# Patient Record
Sex: Male | Born: 1955
Health system: Southern US, Community
[De-identification: ages and names within clinical notes are randomized; demographics above are authoritative.]

## PROBLEM LIST (undated history)

## (undated) DIAGNOSIS — N200 Calculus of kidney: Secondary | ICD-10-CM

## (undated) DIAGNOSIS — Z87442 Personal history of urinary calculi: Secondary | ICD-10-CM

## (undated) DIAGNOSIS — K648 Other hemorrhoids: Secondary | ICD-10-CM

## (undated) DIAGNOSIS — K8020 Calculus of gallbladder without cholecystitis without obstruction: Secondary | ICD-10-CM

## (undated) DIAGNOSIS — Z8619 Personal history of other infectious and parasitic diseases: Secondary | ICD-10-CM

## (undated) DIAGNOSIS — T7840XA Allergy, unspecified, initial encounter: Secondary | ICD-10-CM

## (undated) DIAGNOSIS — E785 Hyperlipidemia, unspecified: Secondary | ICD-10-CM

## (undated) DIAGNOSIS — M1990 Unspecified osteoarthritis, unspecified site: Secondary | ICD-10-CM

## (undated) DIAGNOSIS — Z86010 Personal history of colonic polyps: Secondary | ICD-10-CM

## (undated) DIAGNOSIS — Z800 Family history of malignant neoplasm of digestive organs: Secondary | ICD-10-CM

## (undated) DIAGNOSIS — E0500 Thyrotoxicosis with diffuse goiter without thyrotoxic crisis or storm: Secondary | ICD-10-CM

## (undated) DIAGNOSIS — Z8719 Personal history of other diseases of the digestive system: Secondary | ICD-10-CM

## (undated) DIAGNOSIS — E119 Type 2 diabetes mellitus without complications: Secondary | ICD-10-CM

## (undated) DIAGNOSIS — K219 Gastro-esophageal reflux disease without esophagitis: Secondary | ICD-10-CM

## (undated) DIAGNOSIS — K760 Fatty (change of) liver, not elsewhere classified: Secondary | ICD-10-CM

## (undated) DIAGNOSIS — S82401A Unspecified fracture of shaft of right fibula, initial encounter for closed fracture: Secondary | ICD-10-CM

## (undated) DIAGNOSIS — I10 Essential (primary) hypertension: Secondary | ICD-10-CM

## (undated) HISTORY — DX: Family history of malignant neoplasm of digestive organs: Z80.0

## (undated) HISTORY — DX: Unspecified osteoarthritis, unspecified site: M19.90

## (undated) HISTORY — DX: Allergy, unspecified, initial encounter: T78.40XA

## (undated) HISTORY — DX: Personal history of colonic polyps: Z86.010

## (undated) HISTORY — DX: Essential (primary) hypertension: I10

## (undated) HISTORY — DX: Type 2 diabetes mellitus without complications: E11.9

## (undated) HISTORY — PX: URETERAL STENT PLACEMENT: SHX822

## (undated) HISTORY — DX: Hemochromatosis, unspecified: E83.119

## (undated) HISTORY — DX: Thyrotoxicosis with diffuse goiter without thyrotoxic crisis or storm: E05.00

## (undated) HISTORY — DX: Personal history of other infectious and parasitic diseases: Z86.19

## (undated) HISTORY — DX: Hyperlipidemia, unspecified: E78.5

## (undated) HISTORY — DX: Unspecified fracture of shaft of right fibula, initial encounter for closed fracture: S82.401A

## (undated) HISTORY — DX: Calculus of kidney: N20.0

## (undated) HISTORY — PX: LITHOTRIPSY: SUR834

## (undated) HISTORY — DX: Other hemorrhoids: K64.8

## (undated) HISTORY — DX: Gastro-esophageal reflux disease without esophagitis: K21.9

## (undated) HISTORY — PX: COLONOSCOPY W/ BIOPSIES AND POLYPECTOMY: SHX1376

---

## 2004-10-24 ENCOUNTER — Ambulatory Visit: Payer: Self-pay | Admitting: Family Medicine

## 2004-11-28 ENCOUNTER — Ambulatory Visit: Payer: Self-pay | Admitting: Family Medicine

## 2004-12-05 ENCOUNTER — Ambulatory Visit: Payer: Self-pay | Admitting: Family Medicine

## 2005-01-16 ENCOUNTER — Ambulatory Visit: Payer: Self-pay | Admitting: Internal Medicine

## 2005-11-29 ENCOUNTER — Ambulatory Visit: Payer: Self-pay | Admitting: Family Medicine

## 2005-12-06 ENCOUNTER — Ambulatory Visit: Payer: Self-pay | Admitting: Family Medicine

## 2005-12-31 ENCOUNTER — Ambulatory Visit: Payer: Self-pay | Admitting: Family Medicine

## 2006-12-17 ENCOUNTER — Ambulatory Visit: Payer: Self-pay | Admitting: Family Medicine

## 2006-12-17 LAB — CONVERTED CEMR LAB
Albumin: 4.1 g/dL (ref 3.5–5.2)
BUN: 17 mg/dL (ref 6–23)
Basophils Absolute: 0 10*3/uL (ref 0.0–0.1)
CO2: 31 meq/L (ref 19–32)
Chol/HDL Ratio, serum: 5.9
Creatinine, Ser: 1.3 mg/dL (ref 0.4–1.5)
Hemoglobin: 15.4 g/dL (ref 13.0–17.0)
MCHC: 34.8 g/dL (ref 30.0–36.0)
MCV: 98 fL (ref 78.0–100.0)
Monocytes Absolute: 0.6 10*3/uL (ref 0.2–0.7)
Monocytes Relative: 10.4 % (ref 3.0–11.0)
Neutro Abs: 3.4 10*3/uL (ref 1.4–7.7)
Neutrophils Relative %: 61.8 % (ref 43.0–77.0)
PSA: 0.9 ng/mL (ref 0.10–4.00)
RDW: 11.7 % (ref 11.5–14.6)
TSH: 2.06 microintl units/mL (ref 0.35–5.50)
Total Bilirubin: 1.1 mg/dL (ref 0.3–1.2)
Triglyceride fasting, serum: 185 mg/dL — ABNORMAL HIGH (ref 0–149)
VLDL: 37 mg/dL (ref 0–40)

## 2006-12-24 ENCOUNTER — Ambulatory Visit: Payer: Self-pay | Admitting: Family Medicine

## 2007-02-13 ENCOUNTER — Ambulatory Visit: Payer: Self-pay | Admitting: Family Medicine

## 2007-08-22 DIAGNOSIS — I10 Essential (primary) hypertension: Secondary | ICD-10-CM

## 2007-12-26 ENCOUNTER — Ambulatory Visit: Payer: Self-pay | Admitting: Family Medicine

## 2007-12-26 LAB — CONVERTED CEMR LAB
Nitrite: NEGATIVE
Protein, U semiquant: NEGATIVE
Urobilinogen, UA: 0.2
WBC Urine, dipstick: NEGATIVE
pH: 7

## 2007-12-31 ENCOUNTER — Ambulatory Visit: Payer: Self-pay | Admitting: Family Medicine

## 2007-12-31 ENCOUNTER — Telehealth: Payer: Self-pay | Admitting: Family Medicine

## 2007-12-31 DIAGNOSIS — J309 Allergic rhinitis, unspecified: Secondary | ICD-10-CM | POA: Insufficient documentation

## 2007-12-31 DIAGNOSIS — K219 Gastro-esophageal reflux disease without esophagitis: Secondary | ICD-10-CM | POA: Insufficient documentation

## 2007-12-31 LAB — CONVERTED CEMR LAB
Albumin: 4.6 g/dL (ref 3.5–5.2)
Basophils Absolute: 0 10*3/uL (ref 0.0–0.1)
Cholesterol: 163 mg/dL (ref 0–200)
Creatinine, Ser: 1.2 mg/dL (ref 0.4–1.5)
Eosinophils Absolute: 0.1 10*3/uL (ref 0.0–0.6)
HCT: 43 % (ref 39.0–52.0)
Hemoglobin: 15.4 g/dL (ref 13.0–17.0)
Lymphocytes Relative: 20.1 % (ref 12.0–46.0)
MCHC: 35.7 g/dL (ref 30.0–36.0)
MCV: 97.4 fL (ref 78.0–100.0)
Monocytes Absolute: 0.6 10*3/uL (ref 0.2–0.7)
Neutro Abs: 3.5 10*3/uL (ref 1.4–7.7)
Neutrophils Relative %: 66.2 % (ref 43.0–77.0)
PSA: 0.73 ng/mL (ref 0.10–4.00)
Potassium: 4.6 meq/L (ref 3.5–5.1)
RDW: 11.7 % (ref 11.5–14.6)
Sodium: 140 meq/L (ref 135–145)
TSH: 2.07 microintl units/mL (ref 0.35–5.50)
Total Bilirubin: 0.9 mg/dL (ref 0.3–1.2)
Total Protein: 6.9 g/dL (ref 6.0–8.3)

## 2008-03-17 ENCOUNTER — Ambulatory Visit: Payer: Self-pay | Admitting: Gastroenterology

## 2008-03-31 ENCOUNTER — Encounter: Payer: Self-pay | Admitting: Gastroenterology

## 2008-03-31 ENCOUNTER — Ambulatory Visit: Payer: Self-pay | Admitting: Gastroenterology

## 2008-03-31 ENCOUNTER — Encounter: Payer: Self-pay | Admitting: Family Medicine

## 2008-03-31 DIAGNOSIS — Z860100 Personal history of colon polyps, unspecified: Secondary | ICD-10-CM

## 2008-03-31 HISTORY — DX: Personal history of colon polyps, unspecified: Z86.0100

## 2008-03-31 HISTORY — DX: Personal history of colonic polyps: Z86.010

## 2008-05-17 ENCOUNTER — Ambulatory Visit: Payer: Self-pay | Admitting: Family Medicine

## 2008-05-17 DIAGNOSIS — K921 Melena: Secondary | ICD-10-CM | POA: Insufficient documentation

## 2008-05-17 LAB — CONVERTED CEMR LAB: OCCULT 3: NEGATIVE

## 2008-05-18 ENCOUNTER — Encounter: Payer: Self-pay | Admitting: Family Medicine

## 2008-09-21 ENCOUNTER — Encounter: Payer: Self-pay | Admitting: Family Medicine

## 2008-09-29 ENCOUNTER — Telehealth: Payer: Self-pay | Admitting: Family Medicine

## 2009-01-04 ENCOUNTER — Ambulatory Visit: Payer: Self-pay | Admitting: Family Medicine

## 2009-01-04 LAB — CONVERTED CEMR LAB
Blood in Urine, dipstick: NEGATIVE
Nitrite: NEGATIVE
Protein, U semiquant: NEGATIVE
WBC Urine, dipstick: NEGATIVE

## 2009-01-11 ENCOUNTER — Ambulatory Visit: Payer: Self-pay | Admitting: Family Medicine

## 2009-01-11 LAB — CONVERTED CEMR LAB
AST: 54 units/L — ABNORMAL HIGH (ref 0–37)
Albumin: 4.4 g/dL (ref 3.5–5.2)
Alkaline Phosphatase: 72 units/L (ref 39–117)
BUN: 20 mg/dL (ref 6–23)
Bilirubin, Direct: 0.1 mg/dL (ref 0.0–0.3)
Chloride: 103 meq/L (ref 96–112)
Eosinophils Relative: 3.5 % (ref 0.0–5.0)
GFR calc non Af Amer: 67 mL/min
Glucose, Bld: 94 mg/dL (ref 70–99)
HDL: 20.4 mg/dL — ABNORMAL LOW (ref >39.0)
LDL Cholesterol: 94 mg/dL (ref 0–99)
Monocytes Relative: 10.8 % (ref 3.0–12.0)
Neutrophils Relative %: 63.9 % (ref 43.0–77.0)
Platelets: 212 10*3/uL (ref 150–400)
Potassium: 4.6 meq/L (ref 3.5–5.1)
Total CHOL/HDL Ratio: 7.5
Total Protein: 7.1 g/dL (ref 6.0–8.3)
VLDL: 37 mg/dL (ref 0–40)
WBC: 5.6 10*3/uL (ref 4.5–10.5)

## 2009-08-01 ENCOUNTER — Telehealth: Payer: Self-pay | Admitting: Family Medicine

## 2009-12-28 ENCOUNTER — Telehealth: Payer: Self-pay | Admitting: Family Medicine

## 2010-01-04 ENCOUNTER — Ambulatory Visit: Payer: Self-pay | Admitting: Family Medicine

## 2010-01-18 ENCOUNTER — Ambulatory Visit: Payer: Self-pay | Admitting: Family Medicine

## 2010-01-18 DIAGNOSIS — M7710 Lateral epicondylitis, unspecified elbow: Secondary | ICD-10-CM | POA: Insufficient documentation

## 2010-01-18 LAB — CONVERTED CEMR LAB
Bilirubin Urine: NEGATIVE
Blood in Urine, dipstick: NEGATIVE
Protein, U semiquant: NEGATIVE
Urobilinogen, UA: 0.2

## 2010-01-19 LAB — CONVERTED CEMR LAB
ALT: 63 units/L — ABNORMAL HIGH (ref 0–53)
AST: 64 units/L — ABNORMAL HIGH (ref 0–37)
BUN: 14 mg/dL (ref 6–23)
Basophils Absolute: 0 10*3/uL (ref 0.0–0.1)
Bilirubin, Direct: 0.1 mg/dL (ref 0.0–0.3)
Calcium: 9.5 mg/dL (ref 8.4–10.5)
Cholesterol: 160 mg/dL (ref 0–200)
Creatinine, Ser: 1.2 mg/dL (ref 0.4–1.5)
Eosinophils Relative: 2.8 % (ref 0.0–5.0)
GFR calc non Af Amer: 66.73 mL/min (ref >60)
Glucose, Bld: 76 mg/dL (ref 70–99)
HDL: 30.6 mg/dL — ABNORMAL LOW (ref >39.00)
LDL Cholesterol: 92 mg/dL (ref 0–99)
Lymphocytes Relative: 18.8 % (ref 12.0–46.0)
Monocytes Relative: 8.8 % (ref 3.0–12.0)
Neutrophils Relative %: 69 % (ref 43.0–77.0)
Platelets: 191 10*3/uL (ref 150.0–400.0)
Potassium: 4 meq/L (ref 3.5–5.1)
RDW: 11.2 % — ABNORMAL LOW (ref 11.5–14.6)
Total Bilirubin: 1.1 mg/dL (ref 0.3–1.2)
VLDL: 37.8 mg/dL (ref 0.0–40.0)
WBC: 5.9 10*3/uL (ref 4.5–10.5)

## 2010-04-10 ENCOUNTER — Ambulatory Visit: Payer: Self-pay | Admitting: Family Medicine

## 2010-04-19 ENCOUNTER — Ambulatory Visit: Payer: Self-pay | Admitting: Family Medicine

## 2010-04-19 DIAGNOSIS — Z862 Personal history of diseases of the blood and blood-forming organs and certain disorders involving the immune mechanism: Secondary | ICD-10-CM

## 2010-04-19 DIAGNOSIS — Z8639 Personal history of other endocrine, nutritional and metabolic disease: Secondary | ICD-10-CM

## 2010-04-19 LAB — CONVERTED CEMR LAB
AST: 50 units/L — ABNORMAL HIGH (ref 0–37)
Albumin: 4.2 g/dL (ref 3.5–5.2)
Alkaline Phosphatase: 73 units/L (ref 39–117)
Bilirubin, Direct: 0.2 mg/dL (ref 0.0–0.3)
Total Protein: 6.6 g/dL (ref 6.0–8.3)

## 2010-08-21 ENCOUNTER — Ambulatory Visit: Payer: Self-pay | Admitting: Family Medicine

## 2010-08-21 LAB — CONVERTED CEMR LAB
AST: 61 units/L — ABNORMAL HIGH (ref 0–37)
Albumin: 3.9 g/dL (ref 3.5–5.2)
Alkaline Phosphatase: 81 units/L (ref 39–117)
Total Protein: 6.2 g/dL (ref 6.0–8.3)

## 2010-09-04 ENCOUNTER — Encounter: Admission: RE | Admit: 2010-09-04 | Discharge: 2010-09-04 | Payer: Self-pay | Admitting: Family Medicine

## 2010-12-01 ENCOUNTER — Emergency Department (HOSPITAL_COMMUNITY)
Admission: EM | Admit: 2010-12-01 | Discharge: 2010-12-02 | Payer: Self-pay | Source: Home / Self Care | Admitting: Emergency Medicine

## 2010-12-05 ENCOUNTER — Telehealth: Payer: Self-pay | Admitting: Family Medicine

## 2010-12-29 ENCOUNTER — Telehealth: Payer: Self-pay | Admitting: Family Medicine

## 2011-01-06 ENCOUNTER — Encounter: Payer: Self-pay | Admitting: Family Medicine

## 2011-01-11 NOTE — Assessment & Plan Note (Signed)
Summary: CPX//CCM//PT RESCD//CCM   Vital Signs:  Patient profile:   55 year old male Height:      67 inches Weight:      181 pounds BMI:     28.45 O2 Sat:      97 % Temp:     97.8 degrees F Pulse rate:   71 / minute BP sitting:   124 / 82  (left arm) Cuff size:   regular  Vitals Entered By: Pura Spice, RN (January 18, 2010 8:31 AM) CC: cpx sinus drainage.  Is Patient Diabetic? No Pain Assessment Patient in pain? no       Does patient need assistance? Functional Status Self care   History of Present Illness: This 55 year old white married male is in today for complete physical examination He complains of some postnasal drainage and nasal drainage and his other complaint is that of pain and tenderness in his left elbow at times Had colonoscopic exam in 2009 by Dr. Sheryn Bison and to be repeated in 2014 immunizations up-to-date Needs chest x-ray, blood pressure and control EKG done today reveals no ischemia no abnormalities  Allergies (verified): No Known Drug Allergies  Past History:  Past Medical History: Last updated: 08/22/2007 Allergies Kidney Stones Hypertension  Past Surgical History: Last updated: 08/22/2007 Colonoscopy  Social History: Last updated: 08/22/2007 Occupation: Married Never Smoked Alcohol use-no Drug use-no Regular exercise-no  Risk Factors: Smoking Status: never (08/22/2007)  Past History:  Care Management: Gastroenterology: Dr Sheryn Bison   Review of Systems      See HPI General:  See HPI; Denies chills, fatigue, fever, loss of appetite, malaise, sleep disorder, sweats, weakness, and weight loss. Eyes:  Denies blurring, discharge, double vision, eye irritation, eye pain, halos, itching, light sensitivity, red eye, vision loss-1 eye, and vision loss-both eyes. ENT:  Denies decreased hearing, difficulty swallowing, ear discharge, earache, hoarseness, nasal congestion, nosebleeds, postnasal drainage, ringing in ears,  sinus pressure, and sore throat. CV:  Denies bluish discoloration of lips or nails, chest pain or discomfort, difficulty breathing at night, difficulty breathing while lying down, fainting, fatigue, leg cramps with exertion, lightheadness, near fainting, palpitations, shortness of breath with exertion, swelling of feet, swelling of hands, and weight gain. Resp:  Denies chest discomfort, chest pain with inspiration, cough, coughing up blood, excessive snoring, hypersomnolence, morning headaches, pleuritic, shortness of breath, sputum productive, and wheezing. GI:  Denies abdominal pain, bloody stools, change in bowel habits, constipation, dark tarry stools, diarrhea, excessive appetite, gas, hemorrhoids, indigestion, loss of appetite, nausea, vomiting, vomiting blood, and yellowish skin color. GU:  Denies decreased libido, discharge, dysuria, erectile dysfunction, genital sores, hematuria, incontinence, nocturia, urinary frequency, and urinary hesitancy. MS:  Complains of joint pain; left elbow pain recurrent. Derm:  Denies changes in color of skin, changes in nail beds, dryness, excessive perspiration, flushing, hair loss, insect bite(s), itching, lesion(s), poor wound healing, and rash. Neuro:  Denies brief paralysis, difficulty with concentration, disturbances in coordination, falling down, headaches, inability to speak, memory loss, numbness, poor balance, seizures, sensation of room spinning, tingling, tremors, visual disturbances, and weakness. Psych:  Denies alternate hallucination ( auditory/visual), anxiety, depression, easily angered, easily tearful, irritability, mental problems, panic attacks, sense of great danger, suicidal thoughts/plans, thoughts of violence, unusual visions or sounds, and thoughts /plans of harming others. Endo:  Denies cold intolerance, excessive hunger, excessive thirst, excessive urination, heat intolerance, polyuria, and weight change. Heme:  Denies abnormal bruising,  bleeding, enlarge lymph nodes, fevers, pallor, and skin discoloration.  Physical Exam  General:  Well-developed,well-nourished,in no acute distress; alert,appropriate and cooperative throughout examination Head:  Normocephalic and atraumatic without obvious abnormalities. No apparent alopecia or balding. Eyes:  No corneal or conjunctival inflammation noted. EOMI. Perrla. Funduscopic exam benign, without hemorrhages, exudates or papilledema. Vision grossly normal. Ears:  External ear exam shows no significant lesions or deformities.  Otoscopic examination reveals clear canals, tympanic membranes are intact bilaterally without bulging, retraction, inflammation or discharge. Hearing is grossly normal bilaterally. Nose:  boggy pale nasal mucosa with clear drainage slight Mouth:  Oral mucosa and oropharynx without lesions or exudates.  Teeth in good repair. Neck:  No deformities, masses, or tenderness noted. Chest Wall:  No deformities, masses, tenderness or gynecomastia noted. Breasts:  No masses or gynecomastia noted Lungs:  Normal respiratory effort, chest expands symmetrically. Lungs are clear to auscultation, no crackles or wheezes. Heart:  Normal rate and regular rhythm. S1 and S2 normal without gallop, murmur, click, rub or other extra sounds. Abdomen:  Bowel sounds positive,abdomen soft and non-tender without masses, organomegaly or hernias noted. Rectal:  No external abnormalities noted. Normal sphincter tone. No rectal masses or tenderness. Genitalia:  Testes bilaterally descended without nodularity, tenderness or masses. No scrotal masses or lesions. No penis lesions or urethral discharge. Prostate:  Prostate gland firm and smooth, no enlargement, nodularity, tenderness, mass, asymmetry or induration. Msk:  tender l left elbow, lateral epicondyles Pulses:  R and L carotid,radial,femoral,dorsalis pedis and posterior tibial pulses are full and equal bilaterally Extremities:  No clubbing,  cyanosis, edema, or deformity noted with normal full range of motion of all joints.   Neurologic:  No cranial nerve deficits noted. Station and gait are normal. Plantar reflexes are down-going bilaterally. DTRs are symmetrical throughout. Sensory, motor and coordinative functions appear intact. Skin:  Intact without suspicious lesions or rashes Cervical Nodes:  No lymphadenopathy noted Axillary Nodes:  No palpable lymphadenopathy Inguinal Nodes:  No significant adenopathy Psych:  Cognition and judgment appear intact. Alert and cooperative with normal attention span and concentration. No apparent delusions, illusions, hallucinations   Impression & Recommendations:  Problem # 1:  HEALTH MAINTENANCE EXAM (ICD-V70.0) Assessment Unchanged  Orders: T-2 View CXR (71020TC)  Problem # 2:  ALLERGIC RHINITIS (ICD-477.9) Assessment: Unchanged  Problem # 3:  LATERAL EPICONDYLITIS, LEFT (ICD-726.32) Assessment: Unchanged baclofen accident at 5 mg b.i.d. plus use tennis elbow splint when possible  Problem # 4:  GERD (ICD-530.81) Assessment: Improved  His updated medication list for this problem includes:    Omeprazole 40 Mg Cpdr (Omeprazole) .Marland Kitchen... 1 two times a day for gerd  Problem # 5:  HYPERTENSION (ICD-401.9) Assessment: Improved  His updated medication list for this problem includes:    Lisinopril 20 Mg Tabs (Lisinopril) .Marland Kitchen... Take 1 by mouth  every day for blood pressure  Orders: EKG w/ Interpretation (93000)  Complete Medication List: 1)  Diclofenac Sodium 75 Mg Tbec (Diclofenac sodium) .... Two times a day as needed after meals  for epicondylitis 2)  Adult Aspirin Low Strength 81 Mg Tbdp (Aspirin) 3)  Lisinopril 20 Mg Tabs (Lisinopril) .... Take 1 by mouth  every day for blood pressure 4)  Omeprazole 40 Mg Cpdr (Omeprazole) .Marland Kitchen.. 1 two times a day for gerd 5)  Fenofibrate 160 Mg Tabs (Fenofibrate) .Marland Kitchen.. 1 qd  Patient Instructions: 1)  Physical examination and lab studies good  except for slight elevation of liver enzymes 2)  To re check in 3 months, Hepatic studies 3)  elbow pain is lateral epicondylitis and  would like for you to continue diclofenac and also use tennis elbow splint when be inactive at the left elbow, avoid lifting with the left arm when possible 4)  Continue walking for exercise number increase frequency if possible Prescriptions: FENOFIBRATE 160 MG TABS (FENOFIBRATE) 1 qd  #30 x 11   Entered and Authorized by:   Judithann Sheen MD   Signed by:   Judithann Sheen MD on 01/18/2010   Method used:   [facility] to        [facility]  [address] [id number]       [address]       [place], [place]  [address]       Ph: [phone] or [phone]       Fax: [phone]   RxID:   [id number] OMEPRAZOLE 40 MG CPDR (OMEPRAZOLE) 1 two times a day for gerd  #60 x 11   Entered and Authorized by:   Judithann Sheen MD   Signed by:   Judithann Sheen MD on 01/18/2010   Method used:   [facility]        [facility]  [address] [id number]       [address]       [place], [place]  [address]       Ph: [phone] or [phone]       Fax: [phone]   RxID:   [id number] LISINOPRIL 20 MG  TABS (LISINOPRIL) take 1 by mouth  every day for blood pressure  #30 x 11   Entered and Authorized by:   Judithann Sheen MD   Signed by:   Judithann Sheen MD on 01/18/2010   Method used:   [facility]        [facility]  [address] [id number]       [address]       [place], [place]  [address]       Ph: [phone] or [phone]       Fax: [phone]   RxID:   [id number] DICLOFENAC SODIUM 75 MG TBEC (DICLOFENAC SODIUM) two times a day as needed after meals  for epicondylitis  #60 x 11   Entered and Authorized by:   Judithann Sheen MD   Signed by:   Judithann Sheen MD on 01/18/2010   Method used:   [facility]        [facility]  [address] [id number]       [address]       [place], [place]  [address]       Ph: [phone] or [phone]       Fax: [phone]   RxID:   [id number]

## 2011-01-11 NOTE — Progress Notes (Signed)
Summary: Call A Nurse    Call-A-Nurse Triage Call Report Triage Record Num: [id number] Operator: Jeraldine Loots Patient Name: Ryan Crane Call Date & Time: 12/01/2010 6:45:04PM Patient Phone: [phone] PCP: Rickard Patience Patient Gender: Male PCP Fax : [phone] Patient DOB: 02/16/1956 [facility] Name: [facility] Reason for Call: Pt calling, woke up at 2am with diarrhea, then vomiting around 6a. Pt thinks he might be dehydrated due to continual vomiting and diarrhea. Going to [facility] for evaluation. To take his medications with him. Protocol(s) Used: Nausea or Vomiting Recommended Outcome per Protocol: Call Provider Immediately Reason for Outcome: Unable to take or keep down prescription medication (heart, respiratory, diabetes, thyroid, antibiotics, birth control pills, corticosteroids) because of nausea/vomiting Care Advice:  ~ IMMEDIATE ACTION Vomiting Care Advice: - Do not eat solid foods until vomiting subsides. - Begin taking fluids by sucking on ice chips or popsicles or taking sips of cool clear, nonprescription oral rehydration solution). - Gradually drink larger amounts of these fluids so that you are drinking six to eight 8 oz. (.2 liter) of fluids a day. - Keep activity to a minimum. - After vomiting subsides, eat smaller, more frequent meals of easily digested foods such as crackers, toast, bananas, rice, cooked cereal, applesauce, broth, baked or mashed potatoes, chicken or [place] without skin. Eat slowly. - Take fluids 30 minutes before or 60 minutes after meals. - Avoid high fat, highly seasoned, high fiber or high sugar content foods. - Avoid extremely hot or cold foods. - Do not take pain medication (such as aspirin, NSAIDs) while nauseated or vomiting. - Consult your provider for advice regarding continuing prescription medication. - Rest as much as possible in a sitting or in a propped lying position. Do not lie flat for at least 2 hours  after eating.  ~ 12/01/2010 6:55:18PM Page 1 of 1 [email or web address]

## 2011-01-11 NOTE — Assessment & Plan Note (Signed)
Summary: fu on lab work/njr   Vital Signs:  Patient profile:   55 year old male Weight:      185 pounds O2 Sat:      98 % Temp:     97.7 degrees F Pulse rate:   57 / minute BP sitting:   120 / 80  (left arm)  Vitals Entered By: Pura Spice, RN (Apr 19, 2010 9:35 AM) CC: go over lab studies    History of Present Illness: This 55 year old white married male had a complete physical in January and at this time laboratory studies reveal an elevated liver function test and the patient has returned for repeat studies and discussed his problem ALT test decreased to normal however AST continues to be slightly elevated He has no symptoms and instructed to return in 4 months for repeat studies  Allergies (verified): No Known Drug Allergies  Past History:  Past Medical History: Last updated: 08/22/2007 Allergies Kidney Stones Hypertension  Past Surgical History: Last updated: 08/22/2007 Colonoscopy  Social History: Last updated: 08/22/2007 Occupation: Married Never Smoked Alcohol use-no Drug use-no Regular exercise-no  Risk Factors: Smoking Status: never (08/22/2007)  Review of Systems      See HPI  The patient denies anorexia, fever, weight loss, weight gain, vision loss, decreased hearing, hoarseness, chest pain, syncope, dyspnea on exertion, peripheral edema, prolonged cough, headaches, hemoptysis, abdominal pain, melena, hematochezia, severe indigestion/heartburn, hematuria, incontinence, genital sores, muscle weakness, suspicious skin lesions, transient blindness, difficulty walking, depression, unusual weight change, abnormal bleeding, enlarged lymph nodes, angioedema, breast masses, and testicular masses.    Physical Exam  General:  Well-developed,well-nourished,in no acute distress; alert,appropriate and cooperative throughout examination Lungs:  Normal respiratory effort, chest expands symmetrically. Lungs are clear to auscultation, no crackles or  wheezes. Heart:  Normal rate and regular rhythm. S1 and S2 normal without gallop, murmur, click, rub or other extra sounds. Abdomen:  Liver spleen and kidneys are normal in size no masses no tenderness   Impression & Recommendations:  Problem # 1:  LIVER FUNCTION TESTS, ABNORMAL, HX OF (ICD-V12.2) Assessment New repeat liver studies greaqtly improved, repeat in 4 months  Problem # 2:  GERD (ICD-530.81) Assessment: Improved  His updated medication list for this problem includes:    Omeprazole 40 Mg Cpdr (Omeprazole) .Marland Kitchen... 1 two times a day for gerd  Complete Medication List: 1)  Diclofenac Sodium 75 Mg Tbec (Diclofenac sodium) .... Two times a day as needed after meals  for epicondylitis 2)  Adult Aspirin Low Strength 81 Mg Tbdp (Aspirin) 3)  Lisinopril 20 Mg Tabs (Lisinopril) .... Take 1 by mouth  every day for blood pressure 4)  Omeprazole 40 Mg Cpdr (Omeprazole) .Marland Kitchen.. 1 two times a day for gerd 5)  Fenofibrate 160 Mg Tabs (Fenofibrate) .Marland Kitchen.. 1 qd  Patient Instructions: 1)  Liver studies  have improved considerably and doing fine, no problem 2)  repeat hepatic function in 4 months

## 2011-01-11 NOTE — Progress Notes (Signed)
Summary: wants alternative  Phone Note Call from Patient Call back at Home Phone [phone]   Caller: [facility] call Summary of Call: was told that his fenofibrate copay was going up in price. please advise of cheaper alternative or generic. please advise pt and [facility]   Initial call taken by: Warnell Forester,  December 29, 2010 2:07 PM  Follow-up for Phone Call        per Dr Scotty Court he called pharmacist and there is nothing to change to.  pt aware Follow-up by: Alfred Levins, CMA,  January 02, 2011 5:16 PM

## 2011-01-11 NOTE — Progress Notes (Signed)
Summary: rx omperazole   Phone Note From Pharmacy   [facility]  [address] [id number] Reason for Call: Needs renewal Summary of Call: refill omperazole  Initial call taken by: Pura Spice, RN,  December 28, 2009 1:54 PM  Follow-up for Phone Call        ok per dr Alfonzo Feller pt has cpx appt sch for jan 2011  Follow-up by: Pura Spice, RN,  December 28, 2009 1:54 PM    Prescriptions: OMEPRAZOLE 40 MG CPDR (OMEPRAZOLE) 1 by mouth once daily  #30 x 11   Entered by:   Pura Spice, RN   Authorized by:   Judithann Sheen MD   Signed by:   Pura Spice, RN on 12/28/2009   Method used:   [facility]        [facility]  [address] [id number]       [address]       [place], [place]  [address]       Ph: [phone] or [phone]       Fax: [phone]   RxID:   [id number]

## 2011-01-16 ENCOUNTER — Other Ambulatory Visit (INDEPENDENT_AMBULATORY_CARE_PROVIDER_SITE_OTHER): Payer: 59 | Admitting: Family Medicine

## 2011-01-16 DIAGNOSIS — Z0000 Encounter for general adult medical examination without abnormal findings: Secondary | ICD-10-CM

## 2011-01-16 DIAGNOSIS — E785 Hyperlipidemia, unspecified: Secondary | ICD-10-CM

## 2011-01-16 LAB — POCT URINALYSIS DIPSTICK
Bilirubin, UA: NEGATIVE
Glucose, UA: NEGATIVE
Ketones, UA: NEGATIVE
Nitrite, UA: NEGATIVE
Protein, UA: NEGATIVE
Spec Grav, UA: 1.02

## 2011-01-16 LAB — LDL CHOLESTEROL, DIRECT: Direct LDL: 120.8 mg/dL

## 2011-01-16 LAB — HEPATIC FUNCTION PANEL
ALT: 49 U/L (ref 0–53)
AST: 47 U/L — ABNORMAL HIGH (ref 0–37)
Albumin: 4.3 g/dL (ref 3.5–5.2)
Alkaline Phosphatase: 98 U/L (ref 39–117)
Bilirubin, Direct: 0.2 mg/dL (ref 0.0–0.3)
Total Protein: 7 g/dL (ref 6.0–8.3)

## 2011-01-16 LAB — BASIC METABOLIC PANEL
CO2: 27 mEq/L (ref 19–32)
Calcium: 9.6 mg/dL (ref 8.4–10.5)
Chloride: 104 mEq/L (ref 96–112)
Creatinine, Ser: 1.1 mg/dL (ref 0.4–1.5)
Sodium: 140 mEq/L (ref 135–145)

## 2011-01-16 LAB — PSA: PSA: 1.03 ng/mL (ref 0.10–4.00)

## 2011-01-16 LAB — CBC WITH DIFFERENTIAL/PLATELET
Basophils Relative: 0.4 % (ref 0.0–3.0)
Eosinophils Relative: 5.7 % — ABNORMAL HIGH (ref 0.0–5.0)
Hemoglobin: 14.4 g/dL (ref 13.0–17.0)
Lymphocytes Relative: 23.9 % (ref 12.0–46.0)
MCHC: 35.4 g/dL (ref 30.0–36.0)
Neutro Abs: 3.8 10*3/uL (ref 1.4–7.7)
Neutrophils Relative %: 61.4 % (ref 43.0–77.0)
RBC: 4.19 Mil/uL — ABNORMAL LOW (ref 4.22–5.81)
WBC: 6.2 10*3/uL (ref 4.5–10.5)

## 2011-01-16 LAB — LIPID PANEL: HDL: 23.6 mg/dL — ABNORMAL LOW (ref >39.00)

## 2011-01-23 ENCOUNTER — Ambulatory Visit (INDEPENDENT_AMBULATORY_CARE_PROVIDER_SITE_OTHER): Payer: 59 | Admitting: Family Medicine

## 2011-01-23 ENCOUNTER — Encounter: Payer: Self-pay | Admitting: Family Medicine

## 2011-01-23 VITALS — BP 130/80 | HR 84 | Temp 98.1°F | Ht 67.75 in | Wt 177.0 lb

## 2011-01-23 DIAGNOSIS — M129 Arthropathy, unspecified: Secondary | ICD-10-CM

## 2011-01-23 DIAGNOSIS — I10 Essential (primary) hypertension: Secondary | ICD-10-CM

## 2011-01-23 DIAGNOSIS — E785 Hyperlipidemia, unspecified: Secondary | ICD-10-CM

## 2011-01-23 DIAGNOSIS — M1990 Unspecified osteoarthritis, unspecified site: Secondary | ICD-10-CM

## 2011-01-23 DIAGNOSIS — Z0000 Encounter for general adult medical examination without abnormal findings: Secondary | ICD-10-CM

## 2011-01-23 DIAGNOSIS — K219 Gastro-esophageal reflux disease without esophagitis: Secondary | ICD-10-CM

## 2011-01-23 MED ORDER — LISINOPRIL 20 MG PO TABS: 20.0000 mg | ORAL_TABLET | Freq: Every day | ORAL | Status: DC

## 2011-01-23 MED ORDER — FENOFIBRATE 160 MG PO TABS: 160.0000 mg | ORAL_TABLET | Freq: Every day | ORAL | Status: DC

## 2011-01-23 MED ORDER — DICLOFENAC SODIUM 75 MG PO TBEC: 75.0000 mg | DELAYED_RELEASE_TABLET | Freq: Two times a day (BID) | ORAL | Status: DC | PRN

## 2011-01-23 NOTE — Patient Instructions (Addendum)
Physical examination good, EKG negative Refilled medications Recommend excercing as walking 30 minutes 3 times per week If need refills call pharmacy Continue lisinopril diclofenac and fenofibrate as prescribed

## 2011-01-30 ENCOUNTER — Other Ambulatory Visit: Payer: Self-pay | Admitting: Family Medicine

## 2011-02-04 ENCOUNTER — Encounter: Payer: Self-pay | Admitting: Family Medicine

## 2011-02-04 NOTE — Progress Notes (Signed)
  Subjective:    Patient ID: Ryan Crane, male    DOB: [birth year], 55 y.o.   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 He relates that he had to go to the emergency room in December because of dehydration and had acute gastroenteritis for approximately one week. In the emergency room he was given IV fluids and supportive care and improved he has not had a reoccurrence since cleared he relates no other complaints HPI    Review of Systems  Constitutional: Negative.  Negative for activity change.  HENT: Negative.   Eyes: Negative.   Respiratory: Negative.   Cardiovascular: Negative.   Gastrointestinal: Negative.   Genitourinary: Negative.   Musculoskeletal: Positive for arthralgias.  Skin: Negative.   Neurological: Negative.   Hematological: Negative.   Psychiatric/Behavioral: Negative.        Objective:   Physical Exam  Constitutional: He appears well-developed. No distress.  HENT:  Head: Atraumatic.  Left Ear: External ear normal.  Nose: Nose normal.  Mouth/Throat: No oropharyngeal exudate.  Eyes: Conjunctivae and EOM are normal. Pupils are equal, round, and reactive to light. Right eye exhibits no discharge. No scleral icterus.  Neck: Normal range of motion. Neck supple. No JVD present. No tracheal deviation present. No thyromegaly present.  Cardiovascular: Regular rhythm, normal heart sounds and intact distal pulses.  Exam reveals no gallop and no friction rub.   No murmur heard. Pulmonary/Chest: Effort normal and breath sounds normal. No stridor. No respiratory distress. He has no wheezes. He has no rales. He exhibits no tenderness.  Abdominal: Soft. Bowel sounds are normal. He exhibits no distension and no  mass. There is no tenderness. There is no rebound and no guarding.  Genitourinary: Rectum normal, prostate normal and penis normal. Guaiac negative stool. No penile tenderness.  Musculoskeletal: Normal range of motion. He exhibits tenderness. He exhibits no edema.       Past minimal tenderness of the PIP joints bilaterally where both hands no positive findings on examination the nasal  Lymphadenopathy:    He has no cervical adenopathy.  Neurological: He is alert. He has normal reflexes. He displays normal reflexes. No cranial nerve deficit. He exhibits normal muscle tone. Coordination normal.  Skin: Skin is warm and dry. No rash noted. No erythema. No pallor.  Psychiatric: He has a normal mood and affect. His behavior is normal. Judgment and thought content normal.          Assessment & Plan:ealth minutes exam reveals patient to be in very good health including his review laboratories at his  Laboratory studies were negative. Encouraged to start a regular exercise program continue to take his aspirin daily continue his regular prescribed medications for hypertension arthritis and hyperlipidemia continue omeprazole for his GERD return as needed return in one year for annual exam

## 2011-02-19 LAB — POCT I-STAT, CHEM 8
BUN: 24 mg/dL — ABNORMAL HIGH (ref 6–23)
Calcium, Ion: 1.19 mmol/L (ref 1.12–1.32)
Hemoglobin: 15.3 g/dL (ref 13.0–17.0)
TCO2: 24 mmol/L (ref 0–100)

## 2011-02-25 ENCOUNTER — Other Ambulatory Visit: Payer: Self-pay | Admitting: Family Medicine

## 2011-02-26 ENCOUNTER — Telehealth: Payer: Self-pay | Admitting: Family Medicine

## 2011-02-26 NOTE — Telephone Encounter (Signed)
Pt needs new scripts written of diclofenac 75 mg, lisinopril 20 mg,  Fenofibrate 160 mg, Omeprazole 40 mg, [facility] [address]

## 2011-02-27 ENCOUNTER — Telehealth: Payer: Self-pay | Admitting: Family Medicine

## 2011-02-27 ENCOUNTER — Other Ambulatory Visit: Payer: Self-pay | Admitting: Family Medicine

## 2011-02-27 DIAGNOSIS — I10 Essential (primary) hypertension: Secondary | ICD-10-CM

## 2011-02-27 DIAGNOSIS — M1990 Unspecified osteoarthritis, unspecified site: Secondary | ICD-10-CM

## 2011-02-27 MED ORDER — OMEPRAZOLE 40 MG PO CPDR: 40.0000 mg | DELAYED_RELEASE_CAPSULE | Freq: Two times a day (BID) | ORAL | Status: DC

## 2011-02-27 MED ORDER — LISINOPRIL 20 MG PO TABS: 20.0000 mg | ORAL_TABLET | Freq: Every day | ORAL | Status: DC

## 2011-02-27 MED ORDER — FENOFIBRATE 160 MG PO TABS: 160.0000 mg | ORAL_TABLET | Freq: Every day | ORAL | Status: DC

## 2011-02-27 MED ORDER — DICLOFENAC SODIUM 75 MG PO TBEC: 75.0000 mg | DELAYED_RELEASE_TABLET | Freq: Two times a day (BID) | ORAL | Status: DC | PRN

## 2011-02-27 NOTE — Telephone Encounter (Signed)
done

## 2011-02-27 NOTE — Telephone Encounter (Signed)
[facility] [address] and pt is aware all rx have been sent in

## 2011-02-27 NOTE — Telephone Encounter (Signed)
Check med refills and all meds requested were sent in on 01/23/11 and 01/30/11

## 2011-11-05 ENCOUNTER — Encounter: Payer: Self-pay | Admitting: Family Medicine

## 2011-11-05 ENCOUNTER — Ambulatory Visit (INDEPENDENT_AMBULATORY_CARE_PROVIDER_SITE_OTHER): Payer: 59 | Admitting: Family Medicine

## 2011-11-05 DIAGNOSIS — Z862 Personal history of diseases of the blood and blood-forming organs and certain disorders involving the immune mechanism: Secondary | ICD-10-CM

## 2011-11-05 DIAGNOSIS — R7989 Other specified abnormal findings of blood chemistry: Secondary | ICD-10-CM

## 2011-11-05 DIAGNOSIS — I10 Essential (primary) hypertension: Secondary | ICD-10-CM

## 2011-11-05 DIAGNOSIS — E0500 Thyrotoxicosis with diffuse goiter without thyrotoxic crisis or storm: Secondary | ICD-10-CM | POA: Insufficient documentation

## 2011-11-05 DIAGNOSIS — E785 Hyperlipidemia, unspecified: Secondary | ICD-10-CM | POA: Insufficient documentation

## 2011-11-05 DIAGNOSIS — Z8639 Personal history of other endocrine, nutritional and metabolic disease: Secondary | ICD-10-CM

## 2011-11-05 DIAGNOSIS — R6889 Other general symptoms and signs: Secondary | ICD-10-CM

## 2011-11-05 LAB — HEPATIC FUNCTION PANEL
ALT: 52 U/L (ref 0–53)
AST: 59 U/L — ABNORMAL HIGH (ref 0–37)
Albumin: 4.1 g/dL (ref 3.5–5.2)

## 2011-11-05 LAB — TSH: TSH: 0.07 u[IU]/mL — ABNORMAL LOW (ref 0.35–5.50)

## 2011-11-05 NOTE — Assessment & Plan Note (Signed)
With his weight loss, will refer to endo.  He isn't on exogenous thyroid replacement.

## 2011-11-05 NOTE — Assessment & Plan Note (Signed)
Persistent, will check abd u/s.  Benign exam today.

## 2011-11-05 NOTE — Assessment & Plan Note (Signed)
Continue current meds  Continue diet and exercise

## 2011-11-05 NOTE — Patient Instructions (Addendum)
Don't change your meds for now.  You can get your results through our phone system.  Follow the instructions on the blue card. Take care.  Glad to see you.   Schedule a lab visit before your physical in 2013.

## 2011-11-05 NOTE — Progress Notes (Signed)
Weight loss.  He's lost 20 lbs this year.  He's worked on diet, but also had inc in home stress with mother now in ALF.  Also h/o low TSH and mild inc in LFTs.  No jaundice.  No abd pain.    Elevated Cholesterol: Using medications without problems:yes Muscle aches: occ, mild Diet compliance: yes Exercise: some  Hypertension:    Using medication without problems or lightheadedness: yes Chest pain with exertion:no Edema:no Short of breath:no  Meds, vitals, and allergies reviewed.   PMH and SH reviewed  ROS: See HPI.  Otherwise negative.    GEN: nad, alert and oriented HEENT: mucous membranes moist NECK: supple w/o LA, no masses felt in the thyroid, thyroid not ttp CV: rrr. PULM: ctab, no inc wob ABD: soft, +bs EXT: no edema SKIN: no acute rash

## 2011-11-05 NOTE — Assessment & Plan Note (Signed)
Controlled, continue current meds.   

## 2011-11-07 ENCOUNTER — Emergency Department (HOSPITAL_COMMUNITY)
Admission: EM | Admit: 2011-11-07 | Discharge: 2011-11-07 | Disposition: A | Discharge status: Home / Self Care | Payer: 59 | Attending: Emergency Medicine | Admitting: Emergency Medicine

## 2011-11-07 ENCOUNTER — Other Ambulatory Visit: Payer: 59

## 2011-11-07 ENCOUNTER — Emergency Department (HOSPITAL_COMMUNITY): Payer: 59

## 2011-11-07 ENCOUNTER — Encounter (HOSPITAL_COMMUNITY): Payer: Self-pay | Admitting: *Deleted

## 2011-11-07 ENCOUNTER — Telehealth: Payer: Self-pay | Admitting: *Deleted

## 2011-11-07 DIAGNOSIS — R109 Unspecified abdominal pain: Secondary | ICD-10-CM | POA: Insufficient documentation

## 2011-11-07 DIAGNOSIS — Z79899 Other long term (current) drug therapy: Secondary | ICD-10-CM | POA: Insufficient documentation

## 2011-11-07 DIAGNOSIS — N201 Calculus of ureter: Secondary | ICD-10-CM | POA: Insufficient documentation

## 2011-11-07 DIAGNOSIS — K219 Gastro-esophageal reflux disease without esophagitis: Secondary | ICD-10-CM | POA: Insufficient documentation

## 2011-11-07 DIAGNOSIS — R634 Abnormal weight loss: Secondary | ICD-10-CM | POA: Insufficient documentation

## 2011-11-07 DIAGNOSIS — Z7982 Long term (current) use of aspirin: Secondary | ICD-10-CM | POA: Insufficient documentation

## 2011-11-07 DIAGNOSIS — R112 Nausea with vomiting, unspecified: Secondary | ICD-10-CM | POA: Insufficient documentation

## 2011-11-07 DIAGNOSIS — R61 Generalized hyperhidrosis: Secondary | ICD-10-CM | POA: Insufficient documentation

## 2011-11-07 DIAGNOSIS — E0590 Thyrotoxicosis, unspecified without thyrotoxic crisis or storm: Secondary | ICD-10-CM | POA: Insufficient documentation

## 2011-11-07 DIAGNOSIS — M129 Arthropathy, unspecified: Secondary | ICD-10-CM | POA: Insufficient documentation

## 2011-11-07 DIAGNOSIS — N1330 Unspecified hydronephrosis: Secondary | ICD-10-CM

## 2011-11-07 DIAGNOSIS — E785 Hyperlipidemia, unspecified: Secondary | ICD-10-CM | POA: Insufficient documentation

## 2011-11-07 DIAGNOSIS — I10 Essential (primary) hypertension: Secondary | ICD-10-CM | POA: Insufficient documentation

## 2011-11-07 LAB — CBC
HCT: 37.9 % — ABNORMAL LOW (ref 39.0–52.0)
Hemoglobin: 13.2 g/dL (ref 13.0–17.0)
MCH: 31.4 pg (ref 26.0–34.0)
MCHC: 34.8 g/dL (ref 30.0–36.0)
RDW: 11.9 % (ref 11.5–15.5)

## 2011-11-07 LAB — DIFFERENTIAL
Basophils Absolute: 0 10*3/uL (ref 0.0–0.1)
Basophils Relative: 0 % (ref 0–1)
Eosinophils Absolute: 0.1 10*3/uL (ref 0.0–0.7)
Monocytes Absolute: 0.4 10*3/uL (ref 0.1–1.0)
Monocytes Relative: 7 % (ref 3–12)

## 2011-11-07 LAB — URINE MICROSCOPIC-ADD ON

## 2011-11-07 LAB — URINALYSIS, ROUTINE W REFLEX MICROSCOPIC
Ketones, ur: 15 mg/dL — AB
Protein, ur: 30 mg/dL — AB
Urobilinogen, UA: 1 mg/dL (ref 0.0–1.0)

## 2011-11-07 LAB — COMPREHENSIVE METABOLIC PANEL
Alkaline Phosphatase: 135 U/L — ABNORMAL HIGH (ref 39–117)
BUN: 18 mg/dL (ref 6–23)
Calcium: 10 mg/dL (ref 8.4–10.5)
Creatinine, Ser: 0.85 mg/dL (ref 0.50–1.35)
GFR calc Af Amer: >90 mL/min (ref >90)
Glucose, Bld: 209 mg/dL — ABNORMAL HIGH (ref 70–99)
Total Protein: 6.9 g/dL (ref 6.0–8.3)

## 2011-11-07 MED ORDER — KETOROLAC TROMETHAMINE 30 MG/ML IJ SOLN
30.0000 mg | Freq: Once | INTRAMUSCULAR | Status: AC
  Administered 2011-11-07: 30 mg via INTRAVENOUS
  Filled 2011-11-07: qty 1

## 2011-11-07 MED ORDER — ONDANSETRON 8 MG PO TBDP: 8.0000 mg | ORAL_TABLET | Freq: Three times a day (TID) | ORAL | Status: AC | PRN

## 2011-11-07 MED ORDER — HYDROMORPHONE HCL PF 1 MG/ML IJ SOLN
1.0000 mg | Freq: Once | INTRAMUSCULAR | Status: AC
  Administered 2011-11-07: 1 mg via INTRAVENOUS
  Filled 2011-11-07: qty 1

## 2011-11-07 MED ORDER — OXYCODONE-ACETAMINOPHEN 5-325 MG PO TABS: 1.0000 | ORAL_TABLET | ORAL | Status: AC | PRN

## 2011-11-07 MED ORDER — TAMSULOSIN HCL 0.4 MG PO CAPS: ORAL_CAPSULE | ORAL | Status: DC

## 2011-11-07 MED ORDER — SODIUM CHLORIDE 0.9 % IV SOLN
Freq: Once | INTRAVENOUS | Status: AC
  Administered 2011-11-07: 06:00:00 via INTRAVENOUS

## 2011-11-07 MED ORDER — ONDANSETRON HCL 4 MG/2ML IJ SOLN
4.0000 mg | Freq: Once | INTRAMUSCULAR | Status: AC
  Administered 2011-11-07: 4 mg via INTRAVENOUS
  Filled 2011-11-07: qty 2

## 2011-11-07 MED ORDER — SODIUM CHLORIDE 0.9 % IV BOLUS (SEPSIS)
1000.0000 mL | Freq: Once | INTRAVENOUS | Status: AC
  Administered 2011-11-07: 1000 mL via INTRAVENOUS

## 2011-11-07 NOTE — ED Notes (Signed)
Patient is resting comfortably. 

## 2011-11-07 NOTE — ED Notes (Signed)
Patient denies pain and is resting comfortably.  

## 2011-11-07 NOTE — Telephone Encounter (Signed)
Pt called to inform you he missed Xray at [facility] this am because he went to [facility] at 2am for kidney stone. He is still in ER not sure when will be discharged.

## 2011-11-07 NOTE — ED Provider Notes (Signed)
Received patient from Dr. Preston Fleeting. CT scan returned showing a partially obstructing 4 mm proximal right ureteral stone. Patient is still symptomatic with pain, 11:16. We'll stop IV fluids, and repeat Dilaudid for pain. Patient has a incidental finding of abnormal liver parenchyma on CT scan. He has mild elevations of AST and alkaline phosphatase on the complete metabolic panel. Urinalysis has a few bacteria, culture is ordered.  13:10- he has no pain now. He has not recently passed. A kidney stone. Plan: Prescription Percocet and Flomax. Followup with urology in one week. Strain urine to catch stone. Diagnosis: Right proximal ureter, kidney stone with mild hydro-nephrosis. Possible UTI, culture ordered.  Flint Melter, MD 11/07/11 1314

## 2011-11-07 NOTE — ED Provider Notes (Signed)
History     CSN: [id number] Arrival date & time: 11/07/2011  5:28 AM   First MD Initiated Contact with Patient 11/07/11 0541      Chief Complaint  Patient presents with  . Flank Pain    (Consider location/radiation/quality/duration/timing/severity/associated sxs/prior treatment) Patient is a 55 y.o. male presenting with flank pain. The history is provided by the patient.  Flank Pain   pain started 2 hours ago. It is a sharp, severe pain in the right flank which radiates to the right lower abdomen. Pain is rated at 10 out of 10. Nothing makes it better and nothing makes it worse. There is associated nausea and vomiting and diaphoresis. He has not had any fever or chills. Has a history of kidney stones approximately 15 years ago and states that this feels similar to prior kidney stones. He also states that his doctor is evaluating him for a 20 pound weight loss over the last 6 months and he is supposed to have some kind of the scan and creams for imaging later today.  Past Medical History  Diagnosis Date  . Allergy   . Hypertension   . Kidney stones    . Arthritis   . GERD (gastroesophageal reflux disease)   . Hyperlipidemia   . Hx of colonic polyps   . History of chicken pox   . Hyperthyroidism   . Transaminitis     History reviewed. No pertinent past surgical history.  Family History  Problem Relation Age of Onset  . COPD Mother   . Arthritis Mother   . Diabetes Mother   . Hypertension Mother   . Cancer Father     lung cancer  . Colon cancer Maternal Grandmother   . Prostate cancer Neg Hx     History  Substance Use Topics  . Smoking status: Never Smoker   . Smokeless tobacco: Never Used  . Alcohol Use: No      Review of Systems  Genitourinary: Positive for flank pain.  All other systems reviewed and are negative.    Allergies  Review of patient's allergies indicates no known allergies.  Home Medications   Current Outpatient Rx  Name Route Sig  Dispense Refill  . ASPIRIN 81 MG PO TABS Oral Take 81 mg by mouth daily.      Marland Kitchen DICLOFENAC SODIUM 75 MG PO TBEC Oral Take 1 tablet (75 mg total) by mouth 2 (two) times daily as needed. For epicondylitis 60 tablet 11  . FENOFIBRATE 160 MG PO TABS Oral Take 1 tablet (160 mg total) by mouth daily. 30 tablet 11  . OMEPRAZOLE 40 MG PO CPDR Oral Take 1 capsule (40 mg total) by mouth 2 (two) times daily. For gerd 60 capsule 11  . LISINOPRIL 20 MG PO TABS Oral Take 1 tablet (20 mg total) by mouth daily. For BP 30 tablet 11    BP 119/93  Pulse 70  Temp(Src) 97.5 F (36.4 C) (Oral)  Resp 22  SpO2 98%  Physical Exam  Nursing note and vitals reviewed.  55 year old male who appears to be in pain. Vital signs are normal except for mild tachypnea with respiratory rate of 22. Head is normocephalic and atraumatic. PERRLA, EOMI. Oropharynx is clear. Neck is supple without adenopathy or JVD. Back has no spinal tenderness. There is moderate right CVA tenderness. Lungs are clear without any rales, wheezes, rhonchi. Heart has regular rate and rhythm without murmur. Abdomen is soft and nontender without masses or hepatosplenomegaly. Peristalsis is diminished. Extremities  have no cyanosis or edema, full range of motion is present. Skin is mildly diaphoretic without rashes. Neurologic: Mental status is normal, cranial nerves are intact, there are no motor or sensory deficits. Psychiatric: No abnormalities of mood or affect.  ED Course  Procedures (including critical care time)   Labs Reviewed  CBC  DIFFERENTIAL  URINALYSIS, ROUTINE W REFLEX MICROSCOPIC  COMPREHENSIVE METABOLIC PANEL   No results found. Results for orders placed during the hospital encounter of 11/07/11  CBC      Component Value Range   WBC 5.8  4.0 - 10.5 (K/uL)   RBC 4.21 (*) 4.22 - 5.81 (MIL/uL)   Hemoglobin 13.2  13.0 - 17.0 (g/dL)   HCT 16.1 (*) 09.6 - 52.0 (%)   MCV 90.0  78.0 - 100.0 (fL)   MCH 31.4  26.0 - 34.0 (pg)   MCHC 34.8   30.0 - 36.0 (g/dL)   RDW 04.5  40.9 - 81.1 (%)   Platelets 181  150 - 400 (K/uL)  DIFFERENTIAL      Component Value Range   Neutrophils Relative 77  43 - 77 (%)   Neutro Abs 4.5  1.7 - 7.7 (K/uL)   Lymphocytes Relative 14  12 - 46 (%)   Lymphs Abs 0.8  0.7 - 4.0 (K/uL)   Monocytes Relative 7  3 - 12 (%)   Monocytes Absolute 0.4  0.1 - 1.0 (K/uL)   Eosinophils Relative 1  0 - 5 (%)   Eosinophils Absolute 0.1  0.0 - 0.7 (K/uL)   Basophils Relative 0  0 - 1 (%)   Basophils Absolute 0.0  0.0 - 0.1 (K/uL)  URINALYSIS, ROUTINE W REFLEX MICROSCOPIC      Component Value Range   Color, Urine AMBER (*) YELLOW    APPearance CLOUDY (*) CLEAR    Specific Gravity, Urine 1.025  1.005 - 1.030    pH 5.0  5.0 - 8.0    Glucose, UA NEGATIVE  NEGATIVE (mg/dL)   Hgb urine dipstick LARGE (*) NEGATIVE    Bilirubin Urine SMALL (*) NEGATIVE    Ketones, ur 15 (*) NEGATIVE (mg/dL)   Protein, ur 30 (*) NEGATIVE (mg/dL)   Urobilinogen, UA 1.0  0.0 - 1.0 (mg/dL)   Nitrite NEGATIVE  NEGATIVE    Leukocytes, UA SMALL (*) NEGATIVE   COMPREHENSIVE METABOLIC PANEL      Component Value Range   Sodium 141  135 - 145 (mEq/L)   Potassium 4.0  3.5 - 5.1 (mEq/L)   Chloride 106  96 - 112 (mEq/L)   CO2 22  19 - 32 (mEq/L)   Glucose, Bld 209 (*) 70 - 99 (mg/dL)   BUN 18  6 - 23 (mg/dL)   Creatinine, Ser 9.14  0.50 - 1.35 (mg/dL)   Calcium 78.2  8.4 - 10.5 (mg/dL)   Total Protein 6.9  6.0 - 8.3 (g/dL)   Albumin 3.9  3.5 - 5.2 (g/dL)   AST 51 (*) 0 - 37 (U/L)   ALT 45  0 - 53 (U/L)   Alkaline Phosphatase 135 (*) 39 - 117 (U/L)   Total Bilirubin 0.9  0.3 - 1.2 (mg/dL)   GFR calc non Af Amer >90  >90 (mL/min)   GFR calc Af Amer >90  >90 (mL/min)  URINE MICROSCOPIC-ADD ON      Component Value Range   Squamous Epithelial / LPF RARE  RARE    WBC, UA 3-6  <3 (WBC/hpf)   RBC / HPF TOO NUMEROUS  TO COUNT  <3 (RBC/hpf)   Bacteria, UA FEW (*) RARE    Casts GRANULAR CAST (*) NEGATIVE    Urine-Other MUCOUS PRESENT     Ct  Abdomen Pelvis Wo Contrast  11/07/2011  *RADIOLOGY REPORT*  Clinical Data: Right flank pain and hematuria.  CT ABDOMEN AND PELVIS WITHOUT CONTRAST  Technique:  Multidetector CT imaging of the abdomen and pelvis was performed following the standard protocol without intravenous contrast.  Comparison: None.  Findings: Lung bases show no acute findings.  Large hiatal hernia. Heart size normal.  Small lymph nodes are seen adjacent to the inferior vena cava.  No pericardial or pleural effusion.  Liver is mildly increased in attenuation diffusely.  Gallbladder and adrenal glands are unremarkable.  There is a tiny stone in the right kidney.  Mild right hydronephrosis, secondary to a 4 mm proximal right ureteral stone.  Remainder of the right ureter is decompressed.  Left kidney, spleen, pancreas, stomach and bowel are otherwise unremarkable.  A gastrohepatic ligament lymph node is borderline enlarged, measuring 11 mm.  No free fluid.  Minimal scattered atherosclerotic calcification of the arterial vasculature.  Retroaortic left renal vein.  Mild haziness in the small bowel mesentery is nonspecific. No worrisome lytic or sclerotic lesions.  IMPRESSION:  1.  Mildly obstructing proximal right ureteral stone. 2.  Right renal stone. 3.  Large hiatal hernia. 4.  Mildly increased attenuation throughout the liver parenchyma. Entities such as hemachromatosis and certain medications can create this appearance.  Correlation with liver function tests may be helpful in further evaluation, as clinically indicated.  Original Report Authenticated By: Reyes Ivan, M.D.      No diagnosis found.  Patient's wife showed me discharge paperwork from his doctor's visit recently where he was noted to have an abnormal TSH and was to have a hepatic function panel done.  He is much more comfortable after IV fluids, IV Dilaudid, IV Zofran, and IV Toradol.  MDM  Probable ureteral colic        Dione Booze, MD 11/07/11 2259

## 2011-11-07 NOTE — Telephone Encounter (Signed)
Please call pt.  I completely understand about missing it and I'm sorry that he had another stone.  The ER should send me the records and I saw the CT scan results.  Please help him reschedule the u/s at his convenience.

## 2011-11-07 NOTE — ED Notes (Signed)
Report received, assumed care.  

## 2011-11-07 NOTE — ED Notes (Signed)
C/o R flank pain, onset ~ 0300, h/o kidney stones on L, "feels similar", also nv. No meds PTA.

## 2011-11-11 ENCOUNTER — Encounter (HOSPITAL_COMMUNITY): Payer: Self-pay | Admitting: Emergency Medicine

## 2011-11-11 ENCOUNTER — Emergency Department (HOSPITAL_COMMUNITY)
Admission: EM | Admit: 2011-11-11 | Discharge: 2011-11-11 | Disposition: A | Discharge status: Home / Self Care | Payer: 59 | Attending: Emergency Medicine | Admitting: Emergency Medicine

## 2011-11-11 DIAGNOSIS — E0590 Thyrotoxicosis, unspecified without thyrotoxic crisis or storm: Secondary | ICD-10-CM | POA: Insufficient documentation

## 2011-11-11 DIAGNOSIS — Z7982 Long term (current) use of aspirin: Secondary | ICD-10-CM | POA: Insufficient documentation

## 2011-11-11 DIAGNOSIS — E785 Hyperlipidemia, unspecified: Secondary | ICD-10-CM | POA: Insufficient documentation

## 2011-11-11 DIAGNOSIS — N200 Calculus of kidney: Secondary | ICD-10-CM

## 2011-11-11 DIAGNOSIS — K219 Gastro-esophageal reflux disease without esophagitis: Secondary | ICD-10-CM | POA: Insufficient documentation

## 2011-11-11 DIAGNOSIS — Z79899 Other long term (current) drug therapy: Secondary | ICD-10-CM | POA: Insufficient documentation

## 2011-11-11 DIAGNOSIS — M129 Arthropathy, unspecified: Secondary | ICD-10-CM | POA: Insufficient documentation

## 2011-11-11 DIAGNOSIS — I10 Essential (primary) hypertension: Secondary | ICD-10-CM | POA: Insufficient documentation

## 2011-11-11 MED ORDER — OXYCODONE-ACETAMINOPHEN 5-325 MG PO TABS: 1.0000 | ORAL_TABLET | Freq: Four times a day (QID) | ORAL | Status: DC | PRN

## 2011-11-11 NOTE — ED Provider Notes (Signed)
History     CSN: [id number] Arrival date & time: 11/11/2011  3:33 PM   First MD Initiated Contact with Patient 11/11/11 1746      Chief Complaint  Patient presents with  . Nephrolithiasis    (Consider location/radiation/quality/duration/timing/severity/associated sxs/prior treatment) HPI Comments: Patient seen on Wednesday and found to have a 4 mm right-sided kidney stone. He's been taking medication and it helps with the pain however he is running low on his pain medication so he returned here today for refill.  Patient is a 55 y.o. male presenting with flank pain. The history is provided by the patient.  Flank Pain This is a new problem. The current episode started more than 1 week ago. The problem occurs constantly. The problem has not changed since onset.Pertinent negatives include no abdominal pain. The symptoms are aggravated by nothing. The symptoms are relieved by nothing. Treatments tried: Improved with Percocet. The treatment provided moderate relief.    Past Medical History  Diagnosis Date  . Allergy   . Hypertension   . Kidney stones    . Arthritis   . GERD (gastroesophageal reflux disease)   . Hyperlipidemia   . Hx of colonic polyps   . History of chicken pox   . Hyperthyroidism   . Transaminitis   . Kidney stone     History reviewed. No pertinent past surgical history.  Family History  Problem Relation Age of Onset  . COPD Mother   . Arthritis Mother   . Diabetes Mother   . Hypertension Mother   . Cancer Father     lung cancer  . Colon cancer Maternal Grandmother   . Prostate cancer Neg Hx     History  Substance Use Topics  . Smoking status: Never Smoker   . Smokeless tobacco: Never Used  . Alcohol Use: No      Review of Systems  Gastrointestinal: Negative for abdominal pain.  Genitourinary: Positive for flank pain.  All other systems reviewed and are negative.    Allergies  Review of patient's allergies indicates no known  allergies.  Home Medications   Current Outpatient Rx  Name Route Sig Dispense Refill  . ASPIRIN 81 MG PO TABS Oral Take 81 mg by mouth daily.      Marland Kitchen DICLOFENAC SODIUM 75 MG PO TBEC Oral Take 75 mg by mouth daily as needed. For pain    . FENOFIBRATE 160 MG PO TABS Oral Take 1 tablet (160 mg total) by mouth daily. 30 tablet 11  . LISINOPRIL 20 MG PO TABS Oral Take 1 tablet (20 mg total) by mouth daily. For BP 30 tablet 11  . OMEPRAZOLE 40 MG PO CPDR Oral Take 1 capsule (40 mg total) by mouth 2 (two) times daily. For gerd 60 capsule 11  . OXYCODONE-ACETAMINOPHEN 5-325 MG PO TABS Oral Take 1 tablet by mouth every 4 (four) hours as needed for pain. 20 tablet 0  . TAMSULOSIN HCL 0.4 MG PO CAPS  1 q HS to aid stone passage 7 capsule 0  . ONDANSETRON 8 MG PO TBDP Oral Take 1 tablet (8 mg total) by mouth every 8 (eight) hours as needed for nausea. 20 tablet 0    BP 122/76  Pulse 86  Temp(Src) 98.1 F (36.7 C) (Oral)  Resp 20  SpO2 98%  Physical Exam  Nursing note and vitals reviewed. Constitutional: He is oriented to person, place, and time. He appears well-developed and well-nourished. No distress.  HENT:  Head: Normocephalic and atraumatic.  Mouth/Throat: Oropharynx is clear and moist.  Eyes: Conjunctivae and EOM are normal. Pupils are equal, round, and reactive to light.  Neck: Normal range of motion. Neck supple.  Cardiovascular: Normal rate, regular rhythm and intact distal pulses.   No murmur heard. Pulmonary/Chest: Effort normal and breath sounds normal. No respiratory distress. He has no wheezes. He has no rales.  Abdominal: Soft. He exhibits no distension. There is no tenderness. There is no rebound, no guarding and no CVA tenderness.  Musculoskeletal: Normal range of motion. He exhibits no edema and no tenderness.  Neurological: He is alert and oriented to person, place, and time.  Skin: Skin is warm and dry. No rash noted. No erythema.  Psychiatric: He has a normal mood and  affect. His behavior is normal.    ED Course  Procedures (including critical care time)  Labs Reviewed - No data to display No results found.   No diagnosis found.    MDM   Patient here in Wednesday and diagnosed with a 4 mm right-sided kidney stone. Patient states that he is still not pass the stone and he is running low on the Percocet. Denies any infectious symptoms. Still making good urine. Patient is well-appearing on exam. Will refill his pain medication and have him followup with urology.       Gwyneth Sprout, MD 11/11/11 1757

## 2011-11-11 NOTE — ED Notes (Signed)
Pt states he was seen in ED on Wednesday for kidney stone and he has not passed it yet.  States he only has 3 tablets left and is needing more pain medicine.

## 2011-11-15 ENCOUNTER — Telehealth: Payer: Self-pay | Admitting: Internal Medicine

## 2011-11-15 NOTE — Telephone Encounter (Signed)
Patient called and stated when he had a bowel movement this morning it had mucus with a little blood in it.  He stated he knows he is doing a lot of tests to figure out wha'ts going on but just wanted you to know.

## 2011-11-15 NOTE — Telephone Encounter (Signed)
Noted.  I would get through the next round of tests/visits.  If the sx continue, then let me know.  Thanks.

## 2011-11-16 NOTE — Telephone Encounter (Signed)
Patient advised.

## 2011-11-22 ENCOUNTER — Other Ambulatory Visit: Payer: Self-pay

## 2011-11-22 NOTE — Telephone Encounter (Signed)
Pt left v/m on triage he was trying to pass a kidney stone and needed pain med. I called pt and he still has Percocet but will be traveling 11/26/11 and 11/27/11 and would like to pick up rx on Friday for Percocet 5-325 mg. Pt can be reached at [phone] or [phone].( pt did not want Vicodin called in). Thank you.

## 2011-11-23 ENCOUNTER — Other Ambulatory Visit (HOSPITAL_COMMUNITY): Payer: Self-pay | Admitting: Endocrinology

## 2011-11-23 ENCOUNTER — Telehealth: Payer: Self-pay | Admitting: Internal Medicine

## 2011-11-23 DIAGNOSIS — E0590 Thyrotoxicosis, unspecified without thyrotoxic crisis or storm: Secondary | ICD-10-CM

## 2011-11-23 MED ORDER — OXYCODONE-ACETAMINOPHEN 5-325 MG PO TABS: 1.0000 | ORAL_TABLET | Freq: Four times a day (QID) | ORAL | Status: AC | PRN

## 2011-11-23 NOTE — Telephone Encounter (Signed)
Patient called and stated he seen his neurologist and wanted you to know that he has a large liver.

## 2011-11-23 NOTE — Telephone Encounter (Signed)
Patient advised.  Rx left at front desk for pick up. 

## 2011-11-23 NOTE — Telephone Encounter (Signed)
Printed, give to pt.  Thanks.

## 2011-11-23 NOTE — Telephone Encounter (Signed)
Noted, I'll await his further w/u with pending tests and the consult notes.

## 2011-11-28 ENCOUNTER — Encounter: Payer: Self-pay | Admitting: Family Medicine

## 2011-11-28 ENCOUNTER — Ambulatory Visit
Admission: RE | Admit: 2011-11-28 | Discharge: 2011-11-28 | Disposition: A | Discharge status: Home / Self Care | Payer: 59 | Source: Ambulatory Visit | Attending: Family Medicine | Admitting: Family Medicine

## 2011-11-28 ENCOUNTER — Other Ambulatory Visit: Payer: Self-pay | Admitting: Family Medicine

## 2011-11-29 ENCOUNTER — Other Ambulatory Visit (HOSPITAL_COMMUNITY): Payer: 59

## 2011-11-29 ENCOUNTER — Ambulatory Visit (HOSPITAL_COMMUNITY): Payer: 59

## 2011-11-29 NOTE — Progress Notes (Signed)
Quick Note:  Pt aware and states he will wait to see what his other doctor finds out about his labs and call back. ______

## 2011-11-30 ENCOUNTER — Other Ambulatory Visit (HOSPITAL_COMMUNITY): Payer: 59

## 2011-12-05 ENCOUNTER — Telehealth: Payer: Self-pay | Admitting: Family Medicine

## 2011-12-05 NOTE — Telephone Encounter (Signed)
Please call pt.  I looked at the old records that came in.  Given the liver tests from before, I want him to talk to GI.  I put in the referral, but I'd get through with the endo w/u first before going to GI.  Thanks.

## 2011-12-06 ENCOUNTER — Ambulatory Visit (HOSPITAL_COMMUNITY): Payer: 59

## 2011-12-06 ENCOUNTER — Encounter (HOSPITAL_COMMUNITY)
Admission: RE | Admit: 2011-12-06 | Discharge: 2011-12-06 | Disposition: A | Discharge status: Home / Self Care | Payer: 59 | Source: Ambulatory Visit | Attending: Endocrinology | Admitting: Endocrinology

## 2011-12-06 DIAGNOSIS — E0590 Thyrotoxicosis, unspecified without thyrotoxic crisis or storm: Secondary | ICD-10-CM | POA: Insufficient documentation

## 2011-12-06 NOTE — Telephone Encounter (Signed)
LMOVM of home and cell numbers.

## 2011-12-07 ENCOUNTER — Encounter (HOSPITAL_COMMUNITY)
Admission: RE | Admit: 2011-12-07 | Discharge: 2011-12-07 | Disposition: A | Discharge status: Home / Self Care | Payer: 59 | Source: Ambulatory Visit | Attending: Endocrinology | Admitting: Endocrinology

## 2011-12-07 DIAGNOSIS — R946 Abnormal results of thyroid function studies: Secondary | ICD-10-CM | POA: Insufficient documentation

## 2011-12-07 DIAGNOSIS — E0590 Thyrotoxicosis, unspecified without thyrotoxic crisis or storm: Secondary | ICD-10-CM | POA: Insufficient documentation

## 2011-12-07 MED ORDER — SODIUM IODIDE I 131 CAPSULE: 7.0000 | Freq: Once | INTRAVENOUS | Status: AC | PRN

## 2011-12-07 MED ORDER — SODIUM PERTECHNETATE TC 99M INJECTION
10.0000 | Freq: Once | INTRAVENOUS | Status: AC | PRN
  Administered 2011-12-07: 10 via INTRAVENOUS

## 2011-12-07 NOTE — Telephone Encounter (Signed)
Patient advised.

## 2011-12-11 DIAGNOSIS — S82401A Unspecified fracture of shaft of right fibula, initial encounter for closed fracture: Secondary | ICD-10-CM

## 2011-12-11 HISTORY — DX: Unspecified fracture of shaft of right fibula, initial encounter for closed fracture: S82.401A

## 2011-12-13 ENCOUNTER — Encounter: Payer: Self-pay | Admitting: Gastroenterology

## 2011-12-16 ENCOUNTER — Other Ambulatory Visit: Payer: Self-pay | Admitting: Family Medicine

## 2011-12-16 ENCOUNTER — Encounter: Payer: Self-pay | Admitting: Family Medicine

## 2011-12-16 DIAGNOSIS — N200 Calculus of kidney: Secondary | ICD-10-CM | POA: Insufficient documentation

## 2011-12-16 DIAGNOSIS — K449 Diaphragmatic hernia without obstruction or gangrene: Secondary | ICD-10-CM | POA: Insufficient documentation

## 2011-12-24 ENCOUNTER — Telehealth: Payer: Self-pay | Admitting: Internal Medicine

## 2011-12-24 DIAGNOSIS — E0500 Thyrotoxicosis with diffuse goiter without thyrotoxic crisis or storm: Secondary | ICD-10-CM

## 2011-12-24 NOTE — Telephone Encounter (Signed)
Patient called and stated they dx him with Graves disease and oversized liver and also kidney stones and  wants to treat it with radioactive iodine he wanted to make sure there wouldn't be any problems if he had this done and he didn't want to creative any more problems.

## 2011-12-25 NOTE — Telephone Encounter (Signed)
Radioactive iodine for graves is very reasonable and this shouldn't cause more problems.

## 2011-12-25 NOTE — Telephone Encounter (Signed)
Left message on voice mail  to call back

## 2011-12-25 NOTE — Telephone Encounter (Signed)
Patient advised.

## 2011-12-27 ENCOUNTER — Other Ambulatory Visit (HOSPITAL_COMMUNITY): Payer: Self-pay | Admitting: Endocrinology

## 2011-12-27 DIAGNOSIS — E0590 Thyrotoxicosis, unspecified without thyrotoxic crisis or storm: Secondary | ICD-10-CM

## 2012-01-04 ENCOUNTER — Ambulatory Visit (HOSPITAL_COMMUNITY)
Admission: RE | Admit: 2012-01-04 | Discharge: 2012-01-04 | Disposition: A | Discharge status: Home / Self Care | Payer: 59 | Source: Ambulatory Visit | Attending: Endocrinology | Admitting: Endocrinology

## 2012-01-04 DIAGNOSIS — E0590 Thyrotoxicosis, unspecified without thyrotoxic crisis or storm: Secondary | ICD-10-CM | POA: Insufficient documentation

## 2012-01-04 MED ORDER — SODIUM IODIDE I 131 CAPSULE
16.3000 | Freq: Once | INTRAVENOUS | Status: AC | PRN
  Administered 2012-01-04: 16.3 via ORAL

## 2012-01-09 ENCOUNTER — Other Ambulatory Visit: Payer: Self-pay | Admitting: Family Medicine

## 2012-01-09 ENCOUNTER — Encounter: Payer: Self-pay | Admitting: *Deleted

## 2012-01-09 DIAGNOSIS — I10 Essential (primary) hypertension: Secondary | ICD-10-CM

## 2012-01-14 ENCOUNTER — Other Ambulatory Visit (INDEPENDENT_AMBULATORY_CARE_PROVIDER_SITE_OTHER): Payer: 59

## 2012-01-14 ENCOUNTER — Ambulatory Visit (INDEPENDENT_AMBULATORY_CARE_PROVIDER_SITE_OTHER): Payer: 59 | Admitting: Gastroenterology

## 2012-01-14 ENCOUNTER — Encounter: Payer: Self-pay | Admitting: Gastroenterology

## 2012-01-14 VITALS — BP 110/60 | HR 61 | Ht 68.0 in | Wt 158.2 lb

## 2012-01-14 DIAGNOSIS — K219 Gastro-esophageal reflux disease without esophagitis: Secondary | ICD-10-CM

## 2012-01-14 DIAGNOSIS — R7989 Other specified abnormal findings of blood chemistry: Secondary | ICD-10-CM

## 2012-01-14 DIAGNOSIS — R198 Other specified symptoms and signs involving the digestive system and abdomen: Secondary | ICD-10-CM | POA: Insufficient documentation

## 2012-01-14 DIAGNOSIS — Z860100 Personal history of colon polyps, unspecified: Secondary | ICD-10-CM

## 2012-01-14 MED ORDER — PEG-KCL-NACL-NASULF-NA ASC-C 100 G PO SOLR: 1.0000 | Freq: Once | ORAL | Status: DC

## 2012-01-14 NOTE — Patient Instructions (Signed)
Your procedure has been scheduled for 02/13/2012, please follow the seperate instructions.  Your prescription(s) have been sent to you pharmacy.  Please go to the basement today for your labs.

## 2012-01-14 NOTE — Progress Notes (Signed)
History of Present Illness:  This is a very pleasant 56 year old Caucasian male with a history of kidney stones requiring recent CT scan which suggested an echo dense liver not confirmed by ultrasound exam. The patient has had very minimal elevation in his transaminases for least one year without any specific hepatobiliary symptomatology or history of liver disease. He has had previous vaccination for hepatitis A. There is no history of alcohol, NSAIDs, or illicit drug use. Family history is noncontributory. He denies itching, fatigue, nausea vomiting or other GI complaints except for chronic GERD for many years managed with Prilosec 40 mg a day. Recent CT scan due to a prominent hiatal hernia. He had adenomatous colon polyps removed 4 years ago. The patient does take Voltaren 75 mg a day and aspirin 81 mg a day. Bowel movements are generally regular without melena or hematochezia. Is there history of known pancreatitis, previous hepatitis, or other systemic illnesses.  I have reviewed this patient's present history, medical and surgical past history, allergies and medications.     ROS: The remainder of the 10 point ROS is negative.. he has had onset of mild constipation without rectal bleeding. He also gives a history of polyarticular arthritis associated skin rashes or joint swellings. He currently is being evaluated for recurrent kidney stones by urology.   Physical Exam: General well developed well nourished patient in no acute distress, appearing his stated age Eyes PERRLA, no icterus, fundoscopic exam per opthamologist Skin no lesions noted Neck supple, no adenopathy, no thyroid enlargement, no tenderness Chest clear to percussion and auscultation Heart no significant murmurs, gallops or rubs noted Abdomen no hepatosplenomegaly masses or tenderness, BS normal.  Extremities no acute joint lesions, edema, phlebitis or evidence of cellulitis. Neurologic patient oriented x 3, cranial nerves  intact, no focal neurologic deficits noted. Psychological mental status normal and normal affect.  Assessment and plan: There is no evidence of chronic liver disease on exam or hepatomegaly. He has minimal elevation of his SGOT with a normal SGPT, alkaline phosphatase and bilirubin. I have ordered iron studies to exclude hemochromatosis, hepatitis B. surface antigen, hepatitis C antibiotic, root panel, ANA, and alpha-1  antitrypsin level. Reviewed reflux regime with him and have scheduled him for endoscopy to exclude Barrett's mucosa. Also followup colonoscopy in order per his previous adenomatous polyps. Patient is not obese and gives no history of hyperlipidemia to suggest hepatic fatty infiltration.  Encounter Diagnosis  Name Primary?  . Elevated LFTs Yes

## 2012-01-15 LAB — FOLATE: Folate: 18.6 ng/mL (ref >5.9)

## 2012-01-15 LAB — CELIAC PANEL 10
Endomysial Screen: NEGATIVE
Gliadin IgA: 3.2 U/mL (ref <20)
Gliadin IgG: 3.3 U/mL (ref <20)
IgA: 227 mg/dL (ref 68–379)
Tissue Transglut Ab: 5.6 U/mL (ref <20)
Tissue Transglutaminase Ab, IgA: 2.6 U/mL (ref <20)

## 2012-01-16 ENCOUNTER — Telehealth: Payer: Self-pay | Admitting: *Deleted

## 2012-01-16 ENCOUNTER — Telehealth: Payer: Self-pay | Admitting: Gastroenterology

## 2012-01-16 DIAGNOSIS — R7989 Other specified abnormal findings of blood chemistry: Secondary | ICD-10-CM

## 2012-01-16 NOTE — Telephone Encounter (Signed)
Pt aware of labs and will come for more labs tomorrow.

## 2012-01-17 ENCOUNTER — Other Ambulatory Visit: Payer: 59

## 2012-01-17 DIAGNOSIS — R7989 Other specified abnormal findings of blood chemistry: Secondary | ICD-10-CM

## 2012-01-17 NOTE — Telephone Encounter (Signed)
Answered pts questions about hemochromtosis

## 2012-01-18 ENCOUNTER — Other Ambulatory Visit (INDEPENDENT_AMBULATORY_CARE_PROVIDER_SITE_OTHER): Payer: 59

## 2012-01-18 DIAGNOSIS — I10 Essential (primary) hypertension: Secondary | ICD-10-CM

## 2012-01-18 LAB — LIPID PANEL
Cholesterol: 155 mg/dL (ref 0–200)
LDL Cholesterol: 95 mg/dL (ref 0–99)
Total CHOL/HDL Ratio: 7
Triglycerides: 184 mg/dL — ABNORMAL HIGH (ref 0.0–149.0)
VLDL: 36.8 mg/dL (ref 0.0–40.0)

## 2012-01-18 LAB — COMPREHENSIVE METABOLIC PANEL
ALT: 42 U/L (ref 0–53)
AST: 48 U/L — ABNORMAL HIGH (ref 0–37)
Alkaline Phosphatase: 107 U/L (ref 39–117)
BUN: 17 mg/dL (ref 6–23)
Creatinine, Ser: 0.8 mg/dL (ref 0.4–1.5)
Potassium: 4.4 mEq/L (ref 3.5–5.1)

## 2012-01-19 LAB — HEPATITIS C ANTIBODY: HCV Ab: NEGATIVE

## 2012-01-22 ENCOUNTER — Other Ambulatory Visit: Payer: 59

## 2012-01-22 ENCOUNTER — Telehealth: Payer: Self-pay | Admitting: *Deleted

## 2012-01-22 ENCOUNTER — Encounter: Payer: Self-pay | Admitting: *Deleted

## 2012-01-22 LAB — HEMOCHROMATOSIS DNA-PCR(C282Y,H63D)

## 2012-01-22 NOTE — Telephone Encounter (Signed)
Called pt and advised he is pos for hemochomotosis and I have made him an appt for 02/01/2012 at 8:30 if there are any open times before then we will call him and have him come in sooner.

## 2012-01-25 ENCOUNTER — Encounter: Payer: Self-pay | Admitting: Family Medicine

## 2012-01-25 ENCOUNTER — Ambulatory Visit: Payer: 59 | Admitting: Family Medicine

## 2012-01-25 ENCOUNTER — Ambulatory Visit (INDEPENDENT_AMBULATORY_CARE_PROVIDER_SITE_OTHER): Payer: 59 | Admitting: Family Medicine

## 2012-01-25 VITALS — BP 118/60 | HR 67 | Temp 97.9°F | Wt 158.8 lb

## 2012-01-25 DIAGNOSIS — Z0000 Encounter for general adult medical examination without abnormal findings: Secondary | ICD-10-CM

## 2012-01-25 DIAGNOSIS — E785 Hyperlipidemia, unspecified: Secondary | ICD-10-CM

## 2012-01-25 DIAGNOSIS — I10 Essential (primary) hypertension: Secondary | ICD-10-CM

## 2012-01-25 DIAGNOSIS — E0500 Thyrotoxicosis with diffuse goiter without thyrotoxic crisis or storm: Secondary | ICD-10-CM

## 2012-01-25 MED ORDER — PROPRANOLOL HCL ER 80 MG PO CP24: 80.0000 mg | ORAL_CAPSULE | Freq: Two times a day (BID) | ORAL | Status: DC

## 2012-01-25 MED ORDER — LISINOPRIL 20 MG PO TABS: 20.0000 mg | ORAL_TABLET | Freq: Every day | ORAL | Status: DC

## 2012-01-25 MED ORDER — OMEPRAZOLE 40 MG PO CPDR: 40.0000 mg | DELAYED_RELEASE_CAPSULE | Freq: Two times a day (BID) | ORAL | Status: DC

## 2012-01-25 MED ORDER — DICLOFENAC SODIUM 75 MG PO TBEC: 75.0000 mg | DELAYED_RELEASE_TABLET | Freq: Every day | ORAL | Status: DC | PRN

## 2012-01-25 MED ORDER — FENOFIBRATE 160 MG PO TABS: 160.0000 mg | ORAL_TABLET | Freq: Every day | ORAL | Status: DC

## 2012-01-25 NOTE — Patient Instructions (Signed)
See if Dr. Juleen China will check up on your sugar.  Let me know if I can help with anything.  Take care.

## 2012-01-27 ENCOUNTER — Encounter: Payer: Self-pay | Admitting: Family Medicine

## 2012-01-27 DIAGNOSIS — Z0000 Encounter for general adult medical examination without abnormal findings: Secondary | ICD-10-CM | POA: Insufficient documentation

## 2012-01-27 NOTE — Assessment & Plan Note (Signed)
Per GI

## 2012-01-27 NOTE — Assessment & Plan Note (Signed)
Will f/u with GI re: colonoscopy.  PSA options were discussed along with recent recs.  No indication for psa at this point, since patient is low risk and there is no FH of prosate CA.  He declined testing of PSA. Will f/u with Dr. Juleen China re: sugar.  Td and flu up to date.

## 2012-01-27 NOTE — Assessment & Plan Note (Signed)
Lipids okay for now, no change in meds.

## 2012-01-27 NOTE — Progress Notes (Signed)
CPE- See plan.  Routine anticipatory guidance given to patient.  See health maintenance.  Has f/u with GI [id number].  We talked about this today.  He'll talk with GI about tx and potential inheritance pattern.  No vomiting, no abd pain.  Hepatitis labs okay and d/w pt.   Elevated Cholesterol: Using medications without problems:yes Muscle aches: no Diet compliance:yes Other complaints:no  Hypertension:    Using medication without problems or lightheadedness: yes Chest pain with exertion:no Edema:no Short of breath:no  Graves disease s/p ablation, not on replacement yet, followed by endo, [facility]ut.  Weight loss appears to have stabilized.  Tremor improved on BB.   Mild hyperglycemia, d/w pt. He'll f/u with endo about this.  It may be related to the illnesses above and may self resolve.  H/o renal stones, has f/u with uro about this.    PMH and SH reviewed  Meds, vitals, and allergies reviewed.   ROS: See HPI.  Otherwise negative.    GEN: nad, alert and oriented, thin HEENT: mucous membranes moist NECK: supple w/o LA CV: rrr. PULM: ctab, no inc wob ABD: soft, +bs EXT: no edema SKIN: no acute rash Prostate gland firm and smooth, no enlargement, nodularity, tenderness, mass, asymmetry or induration.

## 2012-01-27 NOTE — Assessment & Plan Note (Signed)
Controlled, hopefully he'll come off the BB in the future.  No change for now.

## 2012-01-27 NOTE — Assessment & Plan Note (Signed)
Per Dr. Kohut. 

## 2012-01-29 ENCOUNTER — Encounter: Payer: Self-pay | Admitting: Gastroenterology

## 2012-01-29 ENCOUNTER — Encounter: Payer: 59 | Admitting: Family Medicine

## 2012-01-29 ENCOUNTER — Ambulatory Visit (INDEPENDENT_AMBULATORY_CARE_PROVIDER_SITE_OTHER): Payer: 59 | Admitting: Gastroenterology

## 2012-01-29 NOTE — Patient Instructions (Signed)
Go to the basement on 02/04/2012 for you first Phlebotomy at this time they will draw a ferritin and HGB as well. You will need to have a Phlebotomy every month the order is already in the system you just need to go to the basement for the lab. It is very important to keep these appointments. Dr Jarold Motto advises that your daughter have fasting Iron and TIBC labs done at her Primary care doctor.  Make an appointment to follow up with Dr Jarold Motto in 6 months.

## 2012-01-29 NOTE — Progress Notes (Signed)
History of Present Illness: This is a 56 year old Caucasian male last seen for evaluation of abnormal liver function tests. He had a markedly elevated serum ferritin of over 1500 and positive genetics for hemachromatosis C282Y. the patient has no hepatic or general medical symptoms. He does have some chronic arthritis or half related to his hemachromatosis, and he is on regular NSAID's and Percocet. Other causes of occult liver disease have all been negative. He has one daughter who is asymptomatic.    Current Medications, Allergies, Past Medical History, Past Surgical History, Family History and Social History were reviewed in [employer] record.   Assessment and plan: He is a homozygote for [employer], and I have reviewed this diagnosis, its etiology and therapy with he and his wife in detail. Printed detailed information concerning diagnosis and treatment has been provided. We will check baseline alpha-fetoprotein level, and begin monthly phlebotomies with CBC and ferritin levels every 3 months. I reviewed some dietary restrictions and have advised him to avoid vitamin C and exogenous iron. He is scheduled for endoscopy and colonoscopy as previously planned. His daughter is to have iron screenings done.  Encounter Diagnosis  Name Primary?  . Hemochromatosis Yes

## 2012-01-29 NOTE — Progress Notes (Signed)
Addended by: Harlow Mares D on: 01/29/2012 10:41 AM   Modules accepted: Orders

## 2012-02-01 ENCOUNTER — Ambulatory Visit: Payer: 59 | Admitting: Gastroenterology

## 2012-02-04 ENCOUNTER — Other Ambulatory Visit (INDEPENDENT_AMBULATORY_CARE_PROVIDER_SITE_OTHER): Payer: 59

## 2012-02-04 LAB — AFP TUMOR MARKER: AFP-Tumor Marker: 2.1 ng/mL (ref 0.0–8.0)

## 2012-02-09 ENCOUNTER — Other Ambulatory Visit: Payer: Self-pay | Admitting: Family Medicine

## 2012-02-12 NOTE — Telephone Encounter (Signed)
Verify with pharm, was sent 01/25/12

## 2012-02-13 ENCOUNTER — Ambulatory Visit (AMBULATORY_SURGERY_CENTER): Payer: 59 | Admitting: Gastroenterology

## 2012-02-13 ENCOUNTER — Encounter: Payer: Self-pay | Admitting: Gastroenterology

## 2012-02-13 DIAGNOSIS — K2990 Gastroduodenitis, unspecified, without bleeding: Secondary | ICD-10-CM

## 2012-02-13 DIAGNOSIS — K2980 Duodenitis without bleeding: Secondary | ICD-10-CM | POA: Insufficient documentation

## 2012-02-13 DIAGNOSIS — Z1211 Encounter for screening for malignant neoplasm of colon: Secondary | ICD-10-CM

## 2012-02-13 DIAGNOSIS — D131 Benign neoplasm of stomach: Secondary | ICD-10-CM

## 2012-02-13 DIAGNOSIS — K219 Gastro-esophageal reflux disease without esophagitis: Secondary | ICD-10-CM

## 2012-02-13 DIAGNOSIS — K2970 Gastritis, unspecified, without bleeding: Secondary | ICD-10-CM

## 2012-02-13 DIAGNOSIS — K317 Polyp of stomach and duodenum: Secondary | ICD-10-CM

## 2012-02-13 DIAGNOSIS — Z86010 Personal history of colonic polyps: Secondary | ICD-10-CM

## 2012-02-13 MED ORDER — SODIUM CHLORIDE 0.9 % IV SOLN: 500.0000 mL | INTRAVENOUS | Status: DC

## 2012-02-13 NOTE — Progress Notes (Signed)
Propofol was administered by Brennan Bailey, CRNA. Ryan Crane  The pt tolerated the colonoscopy very well. Ryan Crane  The pt tolerated the egd very well. Ryan Crane

## 2012-02-13 NOTE — Patient Instructions (Signed)

## 2012-02-13 NOTE — Progress Notes (Signed)
Patient did not experience any of the following events: a burn prior to discharge; a fall within the facility; wrong site/side/patient/procedure/implant event; or a hospital transfer or hospital admission upon discharge from the facility. (G8907) Patient did not have preoperative order for IV antibiotic SSI prophylaxis. (G8918)  

## 2012-02-13 NOTE — Op Note (Signed)
[facility] [address] [place], [place]  [address]  ENDOSCOPY PROCEDURE REPORT  PATIENT:  Ryan, Crane  MR#:  [id number] BIRTHDATE:  09-19-1956, 55 yrs. old  GENDER:  male  ENDOSCOPIST:  Altariq Goodall. Jarold Motto, MD, [facility] Referred by:  Crawford Givens, M.D.  PROCEDURE DATE:  02/13/2012 PROCEDURE:  EGD with biopsy, 43239 ASA CLASS:  Class II INDICATIONS:  GERD  MEDICATIONS:   There was residual sedation effect present from prior procedure., propofol (Diprivan) 100 mg IV TOPICAL ANESTHETIC:  DESCRIPTION OF PROCEDURE:   After the risks and benefits of the procedure were explained, informed consent was obtained.  The LB GIF-H180 [id number] endoscope was introduced through the mouth and advanced to the second portion of the duodenum.  The instrument was slowly withdrawn as the mucosa was fully examined. <<PROCEDUREIMAGES>>  There were multiple polyps identified. in the total stomach. 3-12 mm polyps cold bx. sampled,no bleeding,obstruction note.  The esophagus and gastroesophageal junction were completely normal in appearance.  Normal duodenal folds were noted.    Retroflexed views revealed no abnormalities.    The scope was then withdrawn from the patient and the procedure completed.  COMPLICATIONS:  None  ENDOSCOPIC IMPRESSION: 1) Polyps, multiple in the total stomach 2) Normal esophagus 3) Normal duodenal folds 1.probable hyperplastic polyps from chronic PPI use 2.mild duodenitis,r/o h.pylori RECOMMENDATIONS: 1) Await biopsy results 2) Rx CLO if positive 3) continue current medications  ______________________________ Vania Rea. Jarold Motto, MD, [name]  CC:  n. eSIGNED:   Vania Rea. Maira Christon at 02/13/2012 09:12 AM  Nyra Market, [phone]

## 2012-02-13 NOTE — Op Note (Signed)
[facility] [address] [place], [place]  [address]  COLONOSCOPY PROCEDURE REPORT  PATIENT:  Ryan, Crane  MR#:  [id number] BIRTHDATE:  05-Apr-1956, 55 yrs. old  GENDER:  male ENDOSCOPIST:  Shavon Zenz. Jarold Motto, MD, [facility] REF. BY:  Crawford Givens, M.D. PROCEDURE DATE:  02/13/2012 PROCEDURE:  Surveillance Colonoscopy ASA CLASS:  Class II INDICATIONS:  history of pre-cancerous (adenomatous) colon polyps  MEDICATIONS:   propofol (Diprivan) 100 mg IV  DESCRIPTION OF PROCEDURE:   After the risks and benefits and of the procedure were explained, informed consent was obtained. Digital rectal exam was performed and revealed no abnormalities. The LB 180AL [id number] endoscope was introduced through the anus and advanced to the cecum, which was identified by both the appendix and ileocecal valve.  The quality of the prep was excellent, using MoviPrep.  The instrument was then slowly withdrawn as the colon was fully examined. <<PROCEDUREIMAGES>>  FINDINGS:  Internal Hemorrhoids were found.  No polyps or cancers were seen.  This was otherwise a normal examination of the colon. Retroflexed views in the rectum revealed no abnormalities.    The scope was then withdrawn from the patient and the procedure completed.  COMPLICATIONS:  None ENDOSCOPIC IMPRESSION: 1) Internal hemorrhoids 2) No polyps or cancers 3) Otherwise normal examination RECOMMENDATIONS: 1) High fiber diet. 2) Repeat Colonscopy in 10 years.  REPEAT EXAM:  No  ______________________________ Vania Rea. Jarold Motto, MD, [name]  CC:  n. eSIGNED:   Vania Rea. Luan Urbani at 02/13/2012 09:04 AM  Nyra Market, [id number]

## 2012-02-14 ENCOUNTER — Telehealth: Payer: Self-pay

## 2012-02-14 NOTE — Telephone Encounter (Signed)
  Follow up Call-  Call back number 02/13/2012  Post procedure Call Back phone  # [phone]  Permission to leave phone message Yes     Patient questions:  Do you have a fever, pain , or abdominal swelling? no Pain Score  0 *  Have you tolerated food without any problems? yes  Have you been able to return to your normal activities? yes  Do you have any questions about your discharge instructions: Diet   no Medications  no Follow up visit  no  Do you have questions or concerns about your Care? no  Actions: * If pain score is 4 or above: No action needed, pain <4.  The pt said everything went well. Maw

## 2012-02-18 ENCOUNTER — Encounter: Payer: Self-pay | Admitting: Gastroenterology

## 2012-03-03 ENCOUNTER — Other Ambulatory Visit (INDEPENDENT_AMBULATORY_CARE_PROVIDER_SITE_OTHER): Payer: 59

## 2012-03-03 LAB — HEMOGLOBIN: Hemoglobin: 14.9 g/dL (ref 13.0–17.0)

## 2012-03-03 LAB — FERRITIN: Ferritin: >1500 ng/mL — ABNORMAL HIGH (ref 22.0–322.0)

## 2012-04-07 ENCOUNTER — Other Ambulatory Visit (INDEPENDENT_AMBULATORY_CARE_PROVIDER_SITE_OTHER): Payer: 59

## 2012-05-06 ENCOUNTER — Other Ambulatory Visit (INDEPENDENT_AMBULATORY_CARE_PROVIDER_SITE_OTHER): Payer: 59

## 2012-05-08 ENCOUNTER — Other Ambulatory Visit: Payer: Self-pay | Admitting: Gastroenterology

## 2012-06-02 ENCOUNTER — Other Ambulatory Visit (INDEPENDENT_AMBULATORY_CARE_PROVIDER_SITE_OTHER): Payer: 59

## 2012-06-02 LAB — HEMOGLOBIN: Hemoglobin: 13.8 g/dL (ref 13.0–17.0)

## 2012-06-02 LAB — FERRITIN: Ferritin: >1500 ng/mL — ABNORMAL HIGH (ref 22.0–322.0)

## 2012-07-07 ENCOUNTER — Other Ambulatory Visit: Payer: Self-pay

## 2012-07-07 ENCOUNTER — Other Ambulatory Visit (INDEPENDENT_AMBULATORY_CARE_PROVIDER_SITE_OTHER): Payer: 59

## 2012-07-07 LAB — FERRITIN: Ferritin: 1405.4 ng/mL — ABNORMAL HIGH (ref 22.0–322.0)

## 2012-08-04 ENCOUNTER — Other Ambulatory Visit (INDEPENDENT_AMBULATORY_CARE_PROVIDER_SITE_OTHER): Payer: 59

## 2012-08-04 LAB — HEMATOCRIT: HCT: 42.8 % (ref 39.0–52.0)

## 2012-09-08 ENCOUNTER — Other Ambulatory Visit (INDEPENDENT_AMBULATORY_CARE_PROVIDER_SITE_OTHER): Payer: 59

## 2012-10-06 ENCOUNTER — Other Ambulatory Visit (INDEPENDENT_AMBULATORY_CARE_PROVIDER_SITE_OTHER): Payer: 59

## 2012-10-31 ENCOUNTER — Encounter: Payer: Self-pay | Admitting: Family Medicine

## 2012-10-31 ENCOUNTER — Ambulatory Visit (INDEPENDENT_AMBULATORY_CARE_PROVIDER_SITE_OTHER)
Admission: RE | Admit: 2012-10-31 | Discharge: 2012-10-31 | Disposition: A | Discharge status: Home / Self Care | Payer: 59 | Source: Ambulatory Visit | Attending: Family Medicine | Admitting: Family Medicine

## 2012-10-31 ENCOUNTER — Ambulatory Visit (INDEPENDENT_AMBULATORY_CARE_PROVIDER_SITE_OTHER): Payer: 59 | Admitting: Family Medicine

## 2012-10-31 ENCOUNTER — Telehealth: Payer: Self-pay | Admitting: Family Medicine

## 2012-10-31 VITALS — BP 116/72 | HR 87 | Temp 97.5°F | Wt 177.8 lb

## 2012-10-31 DIAGNOSIS — M79609 Pain in unspecified limb: Secondary | ICD-10-CM

## 2012-10-31 DIAGNOSIS — M79673 Pain in unspecified foot: Secondary | ICD-10-CM

## 2012-10-31 DIAGNOSIS — S82409A Unspecified fracture of shaft of unspecified fibula, initial encounter for closed fracture: Secondary | ICD-10-CM

## 2012-10-31 DIAGNOSIS — S82401A Unspecified fracture of shaft of right fibula, initial encounter for closed fracture: Secondary | ICD-10-CM

## 2012-10-31 NOTE — Progress Notes (Signed)
R ankle.  Had some left over oxycodone from renal stones and has to take 2 doses since the injury.  Started about 10 days ago.  Was walking down an embankment and internally rolled his R ankle. It was bruised, resolving now.  Swelling is some better now but not resolved.  Limping with walking but able to bear weight over the last 10 days.  Was able to bear weight at time of injury.  He does better in shoes compared to barefoot.    Meds, vitals, and allergies reviewed.   ROS: See HPI.  Otherwise, noncontributory.  nad R ankle puffy Not ttp on medial/lateral mals Squeeze test neg Normal ROM at the ankle Mid R 5th MT minimally ttp Distal bruising- toes- noted DP pulse wnl.    xrays reviewed.

## 2012-10-31 NOTE — Patient Instructions (Addendum)
Take the oxycodone as needed, use the crutches and stay in the splint for now.   Take care.    [facility] Monday 11/03/12 1:15PM

## 2012-10-31 NOTE — Telephone Encounter (Signed)
Dg Ankle Complete Right  10/31/2012  *RADIOLOGY REPORT*  Clinical Data: Injury 10 days ago.  Pain and swelling.  RIGHT ANKLE - COMPLETE 3+ VIEW  Comparison: None.  Findings: The patient has a nondisplaced distal fibular fracture which appears acute.  No other fracture is identified.  There is some soft tissue swelling about the ankle.  IMPRESSION: Nondisplaced distal fibular fracture.   Original Report Authenticated By: Holley Dexter, M.D.    Dg Foot Complete Right  10/31/2012  *RADIOLOGY REPORT*  Clinical Data: Foot pain along fifth metatarsal after ankle sprain  RIGHT FOOT COMPLETE - 3+ VIEW  Comparison: Concurrently obtained radiographs of the right ankle  Findings: Obliquely oriented fracture through the distal fibular metaphysis extending to the level of the tibial plafond.  The fifth metatarsal is intact.  There is focal degenerative change at the second toe MTP joint with total loss of joint space, subchondral sclerosis and cyst formation.  There is soft tissue swelling about the lateral aspect of the foot.  No ankle joint effusion.  IMPRESSION:  1. Minimally displaced oblique fracture of the distal fibular diaphysis extending to the level of the tibial plafond. 2.  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 This may be secondary to prior injury, or previous Freiberg infraction (avascular necrosis of the head of the second metatarsal). 3.  Soft tissue swelling along the lateral aspect of the foot likely represents redistribution of edema/hemorrhage from the more proximal fibular fracture.  The fifth metatarsal is intact.   Original Report Authenticated By: Malachy Moan, M.D.     Stable nondisplaced distal fib fx. Spoke to dr. Para March, pt in posterior splint and on crutches with close f/u. Appropriate management. Called by rads.  Hannah Beat, MD 10/31/2012, 5:26 PM

## 2012-11-01 DIAGNOSIS — S82401A Unspecified fracture of shaft of right fibula, initial encounter for closed fracture: Secondary | ICD-10-CM | POA: Insufficient documentation

## 2012-11-01 NOTE — Assessment & Plan Note (Signed)
>  25 min spent with face to face with patient, >50% counseling and/or coordinating care xrays d/w pt.   Splinted with posterior splint, much more comfortable after that.  Tolerated well.  Has oxycodone to use prn. If profound inc in pain, then remove splint and notify clinic.  Has f/u with ortho. Has crutches at home to use.

## 2012-11-03 ENCOUNTER — Telehealth: Payer: Self-pay | Admitting: Family Medicine

## 2012-11-03 ENCOUNTER — Other Ambulatory Visit (INDEPENDENT_AMBULATORY_CARE_PROVIDER_SITE_OTHER): Payer: 59

## 2012-11-03 NOTE — Telephone Encounter (Signed)
[address] [place], [place] [address] p. [phone] f. [phone] To: [facility] (After Hours Triage) Fax: [phone] From: [facility] Date/ Time: 10/31/2012 5:20 PM Taken By: Forbes Cellar, CSR Caller: Ryan Crane Facility: not collected Patient: Ryan Crane, Ryan Crane DOB: 1956-03-21 Phone: [phone] Reason for Call: Ryan Crane is calling with results for X-Ray ordered by Crawford Givens Clelia Croft) [facility]). The results are Minimally displaced oblique fracture of the distal fibular diaphysis extending to the level of tibulial plafond. and were read by Archer Asa. Regarding Appointment: Appt Date: Appt Time: Unknown Provider: Crawford Givens Clelia Croft) [facility]) Reason: Details: Outcome:

## 2012-11-17 ENCOUNTER — Other Ambulatory Visit: Payer: Self-pay

## 2012-11-17 MED ORDER — OXYCODONE-ACETAMINOPHEN 5-325 MG PO TABS: 2.0000 | ORAL_TABLET | Freq: Four times a day (QID) | ORAL | Status: DC | PRN

## 2012-11-17 NOTE — Telephone Encounter (Signed)
Please give to patient.  Thanks. 

## 2012-11-17 NOTE — Telephone Encounter (Signed)
Pt left v/m requesting rx oxycodone apap. Call when rx ready for pick up. Pt uses for kidney stones as well as leg fx.

## 2012-11-17 NOTE — Telephone Encounter (Signed)
Wife advised.  Rx left at front desk for pick up.  

## 2012-12-08 ENCOUNTER — Other Ambulatory Visit (INDEPENDENT_AMBULATORY_CARE_PROVIDER_SITE_OTHER): Payer: 59

## 2012-12-08 LAB — HEMATOCRIT: HCT: 44.8 % (ref 39.0–52.0)

## 2012-12-08 LAB — HEMOGLOBIN: Hemoglobin: 15.3 g/dL (ref 13.0–17.0)

## 2012-12-09 ENCOUNTER — Other Ambulatory Visit: Payer: Self-pay | Admitting: *Deleted

## 2012-12-22 ENCOUNTER — Other Ambulatory Visit (INDEPENDENT_AMBULATORY_CARE_PROVIDER_SITE_OTHER): Payer: 59

## 2012-12-22 ENCOUNTER — Other Ambulatory Visit: Payer: Self-pay | Admitting: *Deleted

## 2012-12-22 LAB — HEMOGLOBIN AND HEMATOCRIT, BLOOD: Hemoglobin: 14.5 g/dL (ref 13.0–17.0)

## 2013-01-05 ENCOUNTER — Other Ambulatory Visit (INDEPENDENT_AMBULATORY_CARE_PROVIDER_SITE_OTHER): Payer: 59

## 2013-01-11 ENCOUNTER — Other Ambulatory Visit: Payer: Self-pay | Admitting: Family Medicine

## 2013-01-19 ENCOUNTER — Other Ambulatory Visit (INDEPENDENT_AMBULATORY_CARE_PROVIDER_SITE_OTHER): Payer: 59

## 2013-01-19 LAB — FERRITIN: Ferritin: 950.1 ng/mL — ABNORMAL HIGH (ref 22.0–322.0)

## 2013-02-03 ENCOUNTER — Other Ambulatory Visit (INDEPENDENT_AMBULATORY_CARE_PROVIDER_SITE_OTHER): Payer: 59

## 2013-02-03 LAB — HEMOGLOBIN AND HEMATOCRIT, BLOOD: Hemoglobin: 14.5 g/dL (ref 13.0–17.0)

## 2013-02-07 ENCOUNTER — Other Ambulatory Visit: Payer: Self-pay | Admitting: Family Medicine

## 2013-02-09 ENCOUNTER — Other Ambulatory Visit: Payer: Self-pay | Admitting: Family Medicine

## 2013-02-09 NOTE — Telephone Encounter (Signed)
Electronic refill request.  Please advise. 

## 2013-02-10 NOTE — Telephone Encounter (Signed)
Sent. Thanks.   

## 2013-02-11 ENCOUNTER — Other Ambulatory Visit: Payer: Self-pay | Admitting: Family Medicine

## 2013-02-11 DIAGNOSIS — E7800 Pure hypercholesterolemia, unspecified: Secondary | ICD-10-CM

## 2013-02-16 ENCOUNTER — Other Ambulatory Visit (INDEPENDENT_AMBULATORY_CARE_PROVIDER_SITE_OTHER): Payer: 59

## 2013-02-16 ENCOUNTER — Other Ambulatory Visit: Payer: Self-pay | Admitting: *Deleted

## 2013-02-16 ENCOUNTER — Ambulatory Visit (INDEPENDENT_AMBULATORY_CARE_PROVIDER_SITE_OTHER): Payer: 59

## 2013-02-16 ENCOUNTER — Other Ambulatory Visit: Payer: Self-pay | Admitting: Family Medicine

## 2013-02-16 DIAGNOSIS — R7989 Other specified abnormal findings of blood chemistry: Secondary | ICD-10-CM

## 2013-02-16 DIAGNOSIS — E7800 Pure hypercholesterolemia, unspecified: Secondary | ICD-10-CM

## 2013-02-16 LAB — LIPID PANEL
Cholesterol: 177 mg/dL (ref 0–200)
HDL: 31.3 mg/dL — ABNORMAL LOW (ref >39.00)
LDL Cholesterol: 120 mg/dL — ABNORMAL HIGH (ref 0–99)
VLDL: 26.2 mg/dL (ref 0.0–40.0)

## 2013-02-16 LAB — COMPREHENSIVE METABOLIC PANEL
AST: 44 U/L — ABNORMAL HIGH (ref 0–37)
Alkaline Phosphatase: 73 U/L (ref 39–117)
BUN: 23 mg/dL (ref 6–23)
Creatinine, Ser: 1.6 mg/dL — ABNORMAL HIGH (ref 0.4–1.5)
Glucose, Bld: 96 mg/dL (ref 70–99)
Total Bilirubin: 0.8 mg/dL (ref 0.3–1.2)

## 2013-02-16 LAB — HEMOGLOBIN AND HEMATOCRIT, BLOOD: HCT: 37.1 % — ABNORMAL LOW (ref 39.0–52.0)

## 2013-02-16 LAB — CBC WITH DIFFERENTIAL/PLATELET
Basophils Relative: 0.6 % (ref 0.0–3.0)
Eosinophils Relative: 6.4 % — ABNORMAL HIGH (ref 0.0–5.0)
HCT: 41.3 % (ref 39.0–52.0)
Hemoglobin: 14.3 g/dL (ref 13.0–17.0)
MCHC: 34.7 g/dL (ref 30.0–36.0)
MCV: 100.6 fl — ABNORMAL HIGH (ref 78.0–100.0)
Monocytes Absolute: 0.5 10*3/uL (ref 0.1–1.0)
Neutro Abs: 2.5 10*3/uL (ref 1.4–7.7)
Neutrophils Relative %: 55.3 % (ref 43.0–77.0)
RBC: 4.1 Mil/uL — ABNORMAL LOW (ref 4.22–5.81)
WBC: 4.6 10*3/uL (ref 4.5–10.5)

## 2013-02-16 NOTE — Progress Notes (Unsigned)
Change to every 4 weeks

## 2013-02-17 ENCOUNTER — Telehealth: Payer: Self-pay | Admitting: *Deleted

## 2013-02-17 NOTE — Telephone Encounter (Signed)
Message copied by Florene Glen on Tue Feb 17, 2013  5:22 PM ------      Message from: Jarold Motto, Korbin R      Created: Mon Feb 16, 2013  1:42 PM                   ----- Message -----         From: [facility] Interface         Sent: 02/16/2013  12:58 PM           To: Mardella Layman, MD             ------

## 2013-02-17 NOTE — Telephone Encounter (Signed)
Informed pt of the results of his H&H and d/t them dropping we will change his schedule to Q4weeks; pt stated understanding. He will call for problems.

## 2013-02-18 ENCOUNTER — Other Ambulatory Visit (INDEPENDENT_AMBULATORY_CARE_PROVIDER_SITE_OTHER): Payer: 59

## 2013-02-18 DIAGNOSIS — R7989 Other specified abnormal findings of blood chemistry: Secondary | ICD-10-CM

## 2013-02-18 DIAGNOSIS — R799 Abnormal finding of blood chemistry, unspecified: Secondary | ICD-10-CM

## 2013-02-18 LAB — BASIC METABOLIC PANEL
BUN: 23 mg/dL (ref 6–23)
CO2: 26 mEq/L (ref 19–32)
Chloride: 106 mEq/L (ref 96–112)
Creatinine, Ser: 1.4 mg/dL (ref 0.4–1.5)
Glucose, Bld: 109 mg/dL — ABNORMAL HIGH (ref 70–99)
Potassium: 4.5 mEq/L (ref 3.5–5.1)

## 2013-02-23 ENCOUNTER — Ambulatory Visit (INDEPENDENT_AMBULATORY_CARE_PROVIDER_SITE_OTHER): Payer: 59 | Admitting: Family Medicine

## 2013-02-23 ENCOUNTER — Encounter: Payer: Self-pay | Admitting: Family Medicine

## 2013-02-23 VITALS — BP 108/70 | HR 72 | Temp 97.6°F

## 2013-02-23 DIAGNOSIS — I10 Essential (primary) hypertension: Secondary | ICD-10-CM

## 2013-02-23 DIAGNOSIS — Z0000 Encounter for general adult medical examination without abnormal findings: Secondary | ICD-10-CM

## 2013-02-23 DIAGNOSIS — Z125 Encounter for screening for malignant neoplasm of prostate: Secondary | ICD-10-CM

## 2013-02-23 DIAGNOSIS — E785 Hyperlipidemia, unspecified: Secondary | ICD-10-CM

## 2013-02-23 DIAGNOSIS — M25521 Pain in right elbow: Secondary | ICD-10-CM

## 2013-02-23 DIAGNOSIS — M25529 Pain in unspecified elbow: Secondary | ICD-10-CM

## 2013-02-23 DIAGNOSIS — K449 Diaphragmatic hernia without obstruction or gangrene: Secondary | ICD-10-CM

## 2013-02-23 MED ORDER — FENOFIBRATE 160 MG PO TABS: 160.0000 mg | ORAL_TABLET | Freq: Every day | ORAL | Status: DC

## 2013-02-23 MED ORDER — OMEPRAZOLE 40 MG PO CPDR: 40.0000 mg | DELAYED_RELEASE_CAPSULE | Freq: Two times a day (BID) | ORAL | Status: DC

## 2013-02-23 MED ORDER — LISINOPRIL 20 MG PO TABS: 20.0000 mg | ORAL_TABLET | Freq: Every day | ORAL | Status: DC

## 2013-02-23 NOTE — Assessment & Plan Note (Signed)
Per GI

## 2013-02-23 NOTE — Assessment & Plan Note (Signed)
Medial epicondyle positive on testing, may respond to bracing and icing.  Use tylenol prn pain and avoid nsaids.  Recheck Cr in a few weeks.  He agrees.

## 2013-02-23 NOTE — Assessment & Plan Note (Signed)
Reasonable control, always with low HDL.  No change in meds.

## 2013-02-23 NOTE — Assessment & Plan Note (Signed)
Controlled with current meds, continue as is.

## 2013-02-23 NOTE — Assessment & Plan Note (Signed)
Controlled, continue as is.  Recheck Cr in a few weeks with routine GI labs and he'll avoid all nsaids in the meantime.  Labs d/w pt.

## 2013-02-23 NOTE — Assessment & Plan Note (Signed)
Per endo °

## 2013-02-23 NOTE — Patient Instructions (Signed)
Avoid ibuprofen, motrin, aleve, diclofenac. Take tylenol if needed for elbow pain.  A tennis elbow strap and icing may help.   Recheck kidney function and check PSA with upcoming labs.  Don't change your meds otherwise.   Take care.  Glad to see you.

## 2013-02-23 NOTE — Assessment & Plan Note (Signed)
Routine anticipatory guidance given to patient.  See health maintenance.  Tetanus 2009 Flu done at work fall 2013 Colonoscopy 2013 Living will d/w pt. Would have his wife designated if incapacitated.   Diet and exercise d/w pt.  He's working on both, esp diet with hemochromatosis.   Prostate cancer screening and PSA options (with potential risks and benefits of testing vs not testing) were discussed along with recent recs/guidelines.  He opted for testing PSA at this point. We'll have this done with future labs.

## 2013-02-23 NOTE — Progress Notes (Signed)
CPE- See plan.  Routine anticipatory guidance given to patient.  See health maintenance.  Tetanus 2009 Flu done at work fall 2013 Colonoscopy 2013 Living will d/w pt. Would have his wife designated if incapacitated.   Diet and exercise d/w pt.  He's working on both, esp diet with hemochromatosis.   Prostate cancer screening and PSA options (with potential risks and benefits of testing vs not testing) were discussed along with recent recs/guidelines.  He opted for testing PSA at this point. We'll have this done with future labs.    Hemochoromatosis.  Ferritin trending down and he's monitoring his diet.  Has routine f/u with GI for labs pending.  Feeling well o/w from that standpoint.   Grave's.  S/p ablation.  TSH followed by endo.  Had some weight gain and replacement has been adjusted.   R fibular fracture resolved.  Now walking w/o pain and w/o bracing.  Released by ortho.   Cr inc.  On ACE and diclofenac for years.  No BLE edema.  Had taken some extra ibuprofen just before labs collected for R elbow pain which is a nagging condition for him, worse after activity ie yardwork.  Cr improved some on recheck.    Elevated Cholesterol: Using medications without problems:yes Muscle aches: no Diet compliance:yes Exercise:yes  H/o HH and GERD controlled with current meds and no ADE.  Doing well.   Hypertension:    Using medication without problems or lightheadedness: yes Chest pain with exertion:no Edema:no Short of breath:no  PMH and SH reviewed  Meds, vitals, and allergies reviewed.   ROS: See HPI.  Otherwise negative.    GEN: nad, alert and oriented HEENT: mucous membranes moist NECK: supple w/o LA CV: rrr. PULM: ctab, no inc wob ABD: soft, +bs EXT: no edema SKIN: no acute rash R elbow with normal ROM but tender medially with resisted pronation, some better with compression of the proximal flexor side of the forearm

## 2013-03-17 ENCOUNTER — Other Ambulatory Visit (INDEPENDENT_AMBULATORY_CARE_PROVIDER_SITE_OTHER): Payer: 59

## 2013-03-17 DIAGNOSIS — Z125 Encounter for screening for malignant neoplasm of prostate: Secondary | ICD-10-CM

## 2013-03-17 DIAGNOSIS — I10 Essential (primary) hypertension: Secondary | ICD-10-CM

## 2013-03-17 LAB — BASIC METABOLIC PANEL
CO2: 24 mEq/L (ref 19–32)
Calcium: 9.2 mg/dL (ref 8.4–10.5)
GFR: 58.09 mL/min — ABNORMAL LOW (ref >60.00)
Glucose, Bld: 144 mg/dL — ABNORMAL HIGH (ref 70–99)
Potassium: 4 mEq/L (ref 3.5–5.1)
Sodium: 136 mEq/L (ref 135–145)

## 2013-03-17 LAB — HEMOGLOBIN AND HEMATOCRIT, BLOOD
HCT: 41.8 % (ref 39.0–52.0)
Hemoglobin: 14.7 g/dL (ref 13.0–17.0)

## 2013-03-17 LAB — FERRITIN: Ferritin: 787.8 ng/mL — ABNORMAL HIGH (ref 22.0–322.0)

## 2013-03-30 ENCOUNTER — Other Ambulatory Visit (INDEPENDENT_AMBULATORY_CARE_PROVIDER_SITE_OTHER): Payer: 59

## 2013-03-30 ENCOUNTER — Encounter: Payer: Self-pay | Admitting: *Deleted

## 2013-03-30 ENCOUNTER — Telehealth: Payer: Self-pay | Admitting: Gastroenterology

## 2013-03-30 LAB — FERRITIN: Ferritin: 848.5 ng/mL — ABNORMAL HIGH (ref 22.0–322.0)

## 2013-03-30 LAB — HEMOGLOBIN AND HEMATOCRIT, BLOOD: HCT: 42.3 % (ref 39.0–52.0)

## 2013-03-30 NOTE — Telephone Encounter (Signed)
Every other week is ok

## 2013-03-30 NOTE — Telephone Encounter (Signed)
Notes Recorded by Linna Hoff, RN on 03/23/2013 at 11:39 AM Informed pt of his lab results and new Phlebotomy schedule. Pt states he was on a monthly schedule, then every 2 weeks, then monthly when his H&H dropped; pt is concerned with a weekly draw. Please verify: weekly or every 2 week schedule? Thanks.  Notes Recorded by Mardella Layman, MD on 03/19/2013 at 9:31 AM Resume weekly phlebotomy with HB. Before each.repeat ferritin in 3 mos  Dr Jarold Motto, please review labs from today and answer question from previous note. Thanks.

## 2013-03-31 ENCOUNTER — Other Ambulatory Visit: Payer: Self-pay | Admitting: *Deleted

## 2013-04-06 ENCOUNTER — Other Ambulatory Visit (INDEPENDENT_AMBULATORY_CARE_PROVIDER_SITE_OTHER): Payer: 59

## 2013-04-27 ENCOUNTER — Other Ambulatory Visit (INDEPENDENT_AMBULATORY_CARE_PROVIDER_SITE_OTHER): Payer: 59

## 2013-04-27 LAB — HEMOGLOBIN AND HEMATOCRIT, BLOOD
HCT: 40 % (ref 39.0–52.0)
Hemoglobin: 14 g/dL (ref 13.0–17.0)

## 2013-05-05 ENCOUNTER — Other Ambulatory Visit: Payer: 59

## 2013-05-08 ENCOUNTER — Ambulatory Visit (INDEPENDENT_AMBULATORY_CARE_PROVIDER_SITE_OTHER): Payer: 59 | Admitting: Family Medicine

## 2013-05-08 ENCOUNTER — Encounter: Payer: Self-pay | Admitting: Family Medicine

## 2013-05-08 ENCOUNTER — Telehealth: Payer: Self-pay | Admitting: Family Medicine

## 2013-05-08 ENCOUNTER — Telehealth: Payer: Self-pay

## 2013-05-08 VITALS — BP 120/70 | HR 61 | Temp 98.2°F | Ht 68.0 in | Wt 171.0 lb

## 2013-05-08 DIAGNOSIS — J309 Allergic rhinitis, unspecified: Secondary | ICD-10-CM

## 2013-05-08 DIAGNOSIS — J019 Acute sinusitis, unspecified: Secondary | ICD-10-CM

## 2013-05-08 DIAGNOSIS — H538 Other visual disturbances: Secondary | ICD-10-CM

## 2013-05-08 MED ORDER — AMOXICILLIN 500 MG PO CAPS: 1000.0000 mg | ORAL_CAPSULE | Freq: Two times a day (BID) | ORAL | Status: DC

## 2013-05-08 NOTE — Progress Notes (Signed)
  Subjective:    Patient ID: Ryan Crane, male    DOB: [birth year], 57 y.o.   MRN: [id number]  HPI 57 year old male with history of HTN, allergies presents with 6 days of intermittant blurred vision, cannot focus. Improves with blinking and fa few seconds.  Has also noted post nasal drip causing coughing early morning. Occ used allegra in past. No headache.  No sinus pressure and no pain.  No ear pain.  No fever. No slurred speech, no confusion, no weakness, no numbness. No recent falls or head injuries.   He has had similar episode in past years ago by Dr. Scotty Court. Dx with sinus infection. Treated with antibiotics... Blurred vision resolved with time.  No family history of CVA.  Review of Systems  Constitutional: Negative for fever and fatigue.  HENT: Negative for ear pain.   Eyes: Negative for pain.  Respiratory: Negative for shortness of breath.   Cardiovascular: Negative for chest pain, palpitations and leg swelling.  Gastrointestinal: Negative for abdominal pain.       Objective:   Physical Exam  Constitutional: Vital signs are normal. He appears well-developed and well-nourished.  Non-toxic appearance. He does not appear ill. No distress.  HENT:  Head: Normocephalic and atraumatic.  Right Ear: Hearing, tympanic membrane, external ear and ear canal normal. No tenderness. No foreign bodies. Tympanic membrane is not retracted and not bulging.  Left Ear: Hearing, tympanic membrane, external ear and ear canal normal. No tenderness. No foreign bodies. Tympanic membrane is not retracted and not bulging.  Nose: Nose normal. No mucosal edema or rhinorrhea. Right sinus exhibits no maxillary sinus tenderness and no frontal sinus tenderness. Left sinus exhibits no maxillary sinus tenderness and no frontal sinus tenderness.  Mouth/Throat: Uvula is midline, oropharynx is clear and moist and mucous membranes are normal. Normal dentition. No dental caries. No oropharyngeal exudate or  tonsillar abscesses.  Eyes: Conjunctivae, EOM and lids are normal. No foreign bodies found.  Fundoscopic exam:      The right eye shows no papilledema.       The left eye shows no papilledema.  Neck: Trachea normal, normal range of motion and phonation normal. Neck supple. Carotid bruit is not present. No mass and no thyromegaly present.  Cardiovascular: Normal rate, regular rhythm, S1 normal, S2 normal, normal heart sounds, intact distal pulses and normal pulses.  Exam reveals no gallop.   No murmur heard. Pulmonary/Chest: Effort normal and breath sounds normal. No respiratory distress. He has no wheezes. He has no rhonchi. He has no rales.  Abdominal: Soft. Normal appearance and bowel sounds are normal. There is no hepatosplenomegaly. There is no tenderness. There is no rebound, no guarding and no CVA tenderness. No hernia.  Neurological: He is alert. He has normal reflexes.  Skin: Skin is warm, dry and intact. No rash noted.  Psychiatric: He has a normal mood and affect. His speech is normal and behavior is normal. Judgment normal.          Assessment & Plan:

## 2013-05-08 NOTE — Patient Instructions (Addendum)
Start zyrtec plain at night. Start nasal saline spray 2-3 time  a day or irrigation  Daily.  If symptoms are not improving in 3 days, start antibiotics then.

## 2013-05-08 NOTE — Assessment & Plan Note (Signed)
No definate bacterial infeciton... But pt has had similar symptoms in past that has resolved with antibiotics and treatment of sinus infection.

## 2013-05-08 NOTE — Assessment & Plan Note (Signed)
Add zyrtec and nasal saline irrigation to try to decompress sinuses.

## 2013-05-08 NOTE — Telephone Encounter (Signed)
Patient Information:  Caller Name: Nnaemeka  Phone: [phone]  Patient: Ryan Crane, Ryan Crane  Gender: Male  DOB: 21-Aug-1956  Age: 57 Years  PCP: Crawford Givens Clelia Croft) [facility])  Office Follow Up:  Does the office need to follow up with this patient?: No  Instructions For The Office: N/A  RN Note:  pt does reports early morning sinus drip. Pt denies any pain.  Symptoms  Reason For Call & Symptoms: pt reports that he has had blurry vision x 2-3 days.  pt reports it is intermittent.  pt states he had this before and it was due to sinus pressure on back of eyes.  Reviewed Health History In EMR: Yes  Reviewed Medications In EMR: Yes  Reviewed Allergies In EMR: Yes  Reviewed Surgeries / Procedures: Yes  Date of Onset of Symptoms: 05/05/2013  Guideline(s) Used:  Sinus Pain and Congestion  Disposition Per Guideline:   See Today or Tomorrow in Office  Reason For Disposition Reached:   Patient wants to be seen  Advice Given:  N/A  Patient Will Follow Care Advice:  YES  Appointment Scheduled:  05/08/2013 15:30:00 Appointment Scheduled Provider:  Kerby Nora [facility])

## 2013-05-08 NOTE — Assessment & Plan Note (Addendum)
Nml neuro exam. No red flags. If not improving with current plan consider further eval.

## 2013-05-08 NOTE — Telephone Encounter (Signed)
Pt left v/m having blurred vision on and off for 2 days; pt thinks related to sinus infection. I spoke with pt and he has appt today at 3:30 with Dr Ermalene Searing thru [facility].

## 2013-05-11 ENCOUNTER — Other Ambulatory Visit (INDEPENDENT_AMBULATORY_CARE_PROVIDER_SITE_OTHER): Payer: 59

## 2013-05-22 ENCOUNTER — Other Ambulatory Visit (INDEPENDENT_AMBULATORY_CARE_PROVIDER_SITE_OTHER): Payer: 59

## 2013-06-08 ENCOUNTER — Other Ambulatory Visit (INDEPENDENT_AMBULATORY_CARE_PROVIDER_SITE_OTHER): Payer: 59

## 2013-06-08 LAB — FERRITIN: Ferritin: 380.6 ng/mL — ABNORMAL HIGH (ref 22.0–322.0)

## 2013-06-08 LAB — HEMOGLOBIN AND HEMATOCRIT, BLOOD: HCT: 42.6 % (ref 39.0–52.0)

## 2013-06-16 ENCOUNTER — Telehealth: Payer: Self-pay | Admitting: *Deleted

## 2013-06-16 NOTE — Telephone Encounter (Signed)
Informed pt of labs and that we will change his orders to monthly. Pt stated understanding. Pt reports he found other members of his family on his mom's side that have hemochromatosis; his maternal grandmother, maternal cousin and another cousin that developed the disorder at 28 years. He asked about whether shellfish could increase his iron, but the only data I could find stated shellfish needed to be cooked. Pt stated understanding.

## 2013-06-16 NOTE — Telephone Encounter (Signed)
Message copied by Florene Glen on Tue Jun 16, 2013  9:23 AM ------      Message from: Jarold Motto, Koji R      Created: Mon Jun 08, 2013  4:03 PM       Check monthly ------

## 2013-07-08 ENCOUNTER — Other Ambulatory Visit (INDEPENDENT_AMBULATORY_CARE_PROVIDER_SITE_OTHER): Payer: 59

## 2013-07-08 LAB — HEMOGLOBIN AND HEMATOCRIT, BLOOD: Hemoglobin: 15.4 g/dL (ref 13.0–17.0)

## 2013-07-08 LAB — FERRITIN: Ferritin: 421.9 ng/mL — ABNORMAL HIGH (ref 22.0–322.0)

## 2013-07-09 ENCOUNTER — Telehealth: Payer: Self-pay | Admitting: *Deleted

## 2013-07-09 NOTE — Telephone Encounter (Signed)
Message copied by Florene Glen on Thu Jul 09, 2013  9:02 AM ------      Message from: Jarold Motto, Abrham R      Created: Wed Jul 08, 2013  4:30 PM       Weekly phlebotomies with followup ferritin and hemoglobin and hematocrit every 4 weeks ------

## 2013-07-09 NOTE — Telephone Encounter (Signed)
Informed pt his levels are creeping up again and we need to change his phlebotomies to weekly with associated labs at 4 weeks. Pt stated understanding. Pt will miss one week d/t vacation.

## 2013-07-17 ENCOUNTER — Other Ambulatory Visit (INDEPENDENT_AMBULATORY_CARE_PROVIDER_SITE_OTHER): Payer: 59

## 2013-07-17 LAB — HEMOGLOBIN AND HEMATOCRIT, BLOOD
HCT: 41.7 % (ref 39.0–52.0)
Hemoglobin: 14.6 g/dL (ref 13.0–17.0)

## 2013-07-27 ENCOUNTER — Other Ambulatory Visit (INDEPENDENT_AMBULATORY_CARE_PROVIDER_SITE_OTHER): Payer: 59

## 2013-07-27 LAB — HEMOGLOBIN AND HEMATOCRIT, BLOOD
HCT: 40.4 % (ref 39.0–52.0)
Hemoglobin: 14.1 g/dL (ref 13.0–17.0)

## 2013-08-03 ENCOUNTER — Other Ambulatory Visit (INDEPENDENT_AMBULATORY_CARE_PROVIDER_SITE_OTHER): Payer: 59

## 2013-08-11 ENCOUNTER — Other Ambulatory Visit (INDEPENDENT_AMBULATORY_CARE_PROVIDER_SITE_OTHER): Payer: 59

## 2013-08-11 ENCOUNTER — Telehealth: Payer: Self-pay | Admitting: *Deleted

## 2013-08-11 LAB — HEMOGLOBIN AND HEMATOCRIT, BLOOD: HCT: 36.8 % — ABNORMAL LOW (ref 39.0–52.0)

## 2013-08-11 LAB — FERRITIN: Ferritin: 199.9 ng/mL (ref 22.0–322.0)

## 2013-08-11 NOTE — Telephone Encounter (Signed)
Message copied by Florene Glen on Tue Aug 11, 2013  4:19 PM ------      Message from: Jarold Motto, Samanyu R      Created: Tue Aug 11, 2013  1:23 PM       Go 2 every other week on the phlebotomies.  Also ferritin level with next blood draw ------

## 2013-08-11 NOTE — Telephone Encounter (Signed)
Informed pt of results and labs ordered for 2 weeks. Changing phlebotomy to every other week and ferritin to same week. Did not put in standing orders; will see what results are. Pt stated understanding.

## 2013-08-24 ENCOUNTER — Other Ambulatory Visit: Payer: Self-pay | Admitting: Gastroenterology

## 2013-08-24 ENCOUNTER — Other Ambulatory Visit (INDEPENDENT_AMBULATORY_CARE_PROVIDER_SITE_OTHER): Payer: 59

## 2013-08-24 ENCOUNTER — Other Ambulatory Visit: Payer: Self-pay | Admitting: *Deleted

## 2013-08-24 LAB — HEMOGLOBIN AND HEMATOCRIT, BLOOD
HCT: 39.7 % (ref 39.0–52.0)
Hemoglobin: 13.8 g/dL (ref 13.0–17.0)

## 2013-08-24 LAB — FERRITIN: Ferritin: 169.1 ng/mL (ref 22.0–322.0)

## 2013-08-27 ENCOUNTER — Other Ambulatory Visit: Payer: Self-pay | Admitting: *Deleted

## 2013-09-07 ENCOUNTER — Other Ambulatory Visit (INDEPENDENT_AMBULATORY_CARE_PROVIDER_SITE_OTHER): Payer: 59

## 2013-09-07 LAB — HEMOGLOBIN AND HEMATOCRIT, BLOOD: HCT: 39 % (ref 39.0–52.0)

## 2013-09-21 ENCOUNTER — Telehealth: Payer: Self-pay | Admitting: *Deleted

## 2013-09-21 ENCOUNTER — Other Ambulatory Visit: Payer: Self-pay | Admitting: *Deleted

## 2013-09-21 ENCOUNTER — Other Ambulatory Visit (INDEPENDENT_AMBULATORY_CARE_PROVIDER_SITE_OTHER): Payer: 59

## 2013-09-21 LAB — HEMOGLOBIN AND HEMATOCRIT, BLOOD: HCT: 40.6 % (ref 39.0–52.0)

## 2013-09-21 LAB — FERRITIN: Ferritin: 87 ng/mL (ref 22.0–322.0)

## 2013-09-21 NOTE — Telephone Encounter (Signed)
Informed pt of labs results and new orders entered; pt will see Dr Jarold Motto tomorrow.

## 2013-09-21 NOTE — Telephone Encounter (Signed)
Message copied by Florene Glen on Mon Sep 21, 2013  3:48 PM ------      Message from: Bayard, Franko R      Created: Mon Sep 21, 2013  2:47 PM       Randie Heinz...ferritin < 100.Can reduce phlebotomy to once a month with ferritin every 3 mos ------

## 2013-09-22 ENCOUNTER — Other Ambulatory Visit (INDEPENDENT_AMBULATORY_CARE_PROVIDER_SITE_OTHER): Payer: 59

## 2013-09-22 ENCOUNTER — Ambulatory Visit (INDEPENDENT_AMBULATORY_CARE_PROVIDER_SITE_OTHER): Payer: 59 | Admitting: Gastroenterology

## 2013-09-22 ENCOUNTER — Encounter: Payer: Self-pay | Admitting: Gastroenterology

## 2013-09-22 DIAGNOSIS — M1990 Unspecified osteoarthritis, unspecified site: Secondary | ICD-10-CM

## 2013-09-22 DIAGNOSIS — M129 Arthropathy, unspecified: Secondary | ICD-10-CM

## 2013-09-22 LAB — HEPATIC FUNCTION PANEL
ALT: 23 U/L (ref 0–53)
AST: 29 U/L (ref 0–37)
Bilirubin, Direct: 0.2 mg/dL (ref 0.0–0.3)
Total Bilirubin: 0.7 mg/dL (ref 0.3–1.2)
Total Protein: 7.5 g/dL (ref 6.0–8.3)

## 2013-09-22 LAB — FERRITIN: Ferritin: 78.8 ng/mL (ref 22.0–322.0)

## 2013-09-22 LAB — IBC PANEL: Saturation Ratios: 42.1 % (ref 20.0–50.0)

## 2013-09-22 NOTE — Progress Notes (Signed)
This is a 57 year old Caucasian male homozygote for hemochromatosis C282Y. he has undergone serial phlebotomies so that his ferritin is now less than 100.  He is asymptomatic and as arthritis it was in his hands is markedly diminished.  He denies any hepatobiliary or general medical problems.  He was to talk with the dietitian about dietary concerns.  Apparently in the past been told that he had hepatomegaly and has a mildly abnormal liver function tests.  Cannot find these test and review of his labs, but he did have a normal alpha-fetoprotein level.  Current Medications, Allergies, Past Medical History, Past Surgical History, Family History and Social History were reviewed in [employer] record.  ROS: All systems were reviewed and are negative unless otherwise stated in the HPI.          Physical Exam: At pressure 102/62, pulse 61 and regular weight 176 with a BMI of 26.77.  He has some vague ectasias on his malar areas bilaterally.  I cannot appreciate other stigmata of chronic liver disease.  His abdomen shows no organomegaly, hepatic enlargement, masses or tenderness.  Mental status is normal.  There is no arthritis or swelling of the joints of his hands or joints    Assessment and Plan: Hemochromatosis with successful phlebotomies.  I have recommended he have phlebotomy once a month now with ferritin levels every 3 months.  We will repeat his alpha fetoprotein level, liver function tests, and I referred him to nutrition for consultation at he and his wife's request.  Apparently the daughter was checked for iron overload and was negative.  There certainly no evidence of chronic liver disease on physical exam.  He is up-to-date on his endoscopy and colonoscopy.  His continue other medications as per primary care.

## 2013-09-22 NOTE — Patient Instructions (Addendum)
Your physician has requested that you go to the basement for the following lab work before leaving today: Liver Function Panel Anemia Panel  Alpha Feto-Protein   You will receive a call regarding your dietician referral

## 2013-09-24 ENCOUNTER — Telehealth: Payer: Self-pay | Admitting: *Deleted

## 2013-09-24 NOTE — Telephone Encounter (Signed)
Message copied by Florene Glen on Thu Sep 24, 2013  9:18 AM ------      Message from: Jarold Motto, Taiden R      Created: Tue Sep 22, 2013  4:34 PM       Needs B12 shots and nasal l ------

## 2013-09-25 ENCOUNTER — Telehealth: Payer: Self-pay | Admitting: Gastroenterology

## 2013-09-25 MED ORDER — CYANOCOBALAMIN 1000 MCG/ML IJ SOLN: INTRAMUSCULAR | Status: DC

## 2013-09-25 NOTE — Telephone Encounter (Signed)
Since I called pt this am he looked up Vit B12 Deficiency on the internet and it is closely related to Grave's Disease, which he has. He asked that I fax everything to Dr Juleen China. Done faxed to [phone]

## 2013-09-25 NOTE — Telephone Encounter (Signed)
Informed pt he is deficient in Vit B12; Dr Jarold Motto usually orders an injection weekly for 3 weeks and then once monthly. I called Dr Lianne Bushy [facility] and they will do the injections since we no longer receive enough B12. He also asked about his Nutrition referral and I called and they are 1.5 weeks behind, but they will call him. Pt will call and make an appt for his injection.

## 2013-10-01 ENCOUNTER — Ambulatory Visit (INDEPENDENT_AMBULATORY_CARE_PROVIDER_SITE_OTHER): Payer: 59

## 2013-10-01 DIAGNOSIS — E538 Deficiency of other specified B group vitamins: Secondary | ICD-10-CM

## 2013-10-01 MED ORDER — CYANOCOBALAMIN 1000 MCG/ML IJ SOLN
1000.0000 ug | Freq: Once | INTRAMUSCULAR | Status: AC
  Administered 2013-10-01: 1000 ug via INTRAMUSCULAR

## 2013-10-01 NOTE — Progress Notes (Signed)
  Subjective:    Patient ID: Ryan Crane, male    DOB: 1956-11-24, 57 y.o.   MRN: [id number]  HPI    Review of Systems     Objective:   Physical Exam        Assessment & Plan:  Pt was in office today for first Vit B 12 injection; pt will get Vit B 12 injection weekly for 3 weeks and then monthly for one year as ordered.  Dr Para March said to tell Dr Jarold Motto hello.

## 2013-10-08 ENCOUNTER — Ambulatory Visit (INDEPENDENT_AMBULATORY_CARE_PROVIDER_SITE_OTHER): Payer: 59

## 2013-10-08 DIAGNOSIS — E538 Deficiency of other specified B group vitamins: Secondary | ICD-10-CM

## 2013-10-08 MED ORDER — CYANOCOBALAMIN 1000 MCG/ML IJ SOLN
1000.0000 ug | Freq: Once | INTRAMUSCULAR | Status: AC
  Administered 2013-10-08: 1000 ug via INTRAMUSCULAR

## 2013-10-15 ENCOUNTER — Ambulatory Visit (INDEPENDENT_AMBULATORY_CARE_PROVIDER_SITE_OTHER): Payer: 59

## 2013-10-15 DIAGNOSIS — E538 Deficiency of other specified B group vitamins: Secondary | ICD-10-CM

## 2013-10-15 MED ORDER — CYANOCOBALAMIN 1000 MCG/ML IJ SOLN
1000.0000 ug | Freq: Once | INTRAMUSCULAR | Status: AC
  Administered 2013-10-15: 1000 ug via INTRAMUSCULAR

## 2013-10-19 ENCOUNTER — Other Ambulatory Visit: Payer: Self-pay | Admitting: *Deleted

## 2013-10-19 ENCOUNTER — Other Ambulatory Visit (INDEPENDENT_AMBULATORY_CARE_PROVIDER_SITE_OTHER): Payer: 59

## 2013-10-19 DIAGNOSIS — D649 Anemia, unspecified: Secondary | ICD-10-CM

## 2013-10-19 LAB — CBC WITH DIFFERENTIAL/PLATELET
Basophils Absolute: 0 10*3/uL (ref 0.0–0.1)
Hemoglobin: 14.5 g/dL (ref 13.0–17.0)
Lymphocytes Relative: 23.2 % (ref 12.0–46.0)
MCHC: 34.4 g/dL (ref 30.0–36.0)
MCV: 95.3 fl (ref 78.0–100.0)
Monocytes Absolute: 0.5 10*3/uL (ref 0.1–1.0)
Monocytes Relative: 8 % (ref 3.0–12.0)
Neutro Abs: 4 10*3/uL (ref 1.4–7.7)
Neutrophils Relative %: 63.7 % (ref 43.0–77.0)
RDW: 11.9 % (ref 11.5–14.6)

## 2013-10-19 LAB — FERRITIN: Ferritin: 52.7 ng/mL (ref 22.0–322.0)

## 2013-10-20 ENCOUNTER — Other Ambulatory Visit: Payer: Self-pay | Admitting: *Deleted

## 2013-10-22 ENCOUNTER — Ambulatory Visit: Payer: 59

## 2013-10-23 ENCOUNTER — Encounter: Payer: 59 | Attending: Gastroenterology | Admitting: *Deleted

## 2013-10-23 ENCOUNTER — Encounter: Payer: Self-pay | Admitting: *Deleted

## 2013-10-23 DIAGNOSIS — Z713 Dietary counseling and surveillance: Secondary | ICD-10-CM | POA: Insufficient documentation

## 2013-10-23 NOTE — Progress Notes (Signed)
Medical Nutrition Therapy:  Appt start time: 0800 end time:  0900.  Assessment:  Patient here today due to diagnosis of hemachromatosis. He is currently undergoing serial phlebotomies to keep ferritin less than 100. He reports that he has cut out red meat from his diet, limits intake of leafy greens, and reads food labels for iron intake. He does report that he feels limited about his diet due to iron content of foods, and would like to be given more options of what foods to eat to expand choices. He would also like to learn how to eat healthier to maintain weight between 170-175 pounds. He reports occasional hypoglycemia and a history of Grave's disease with removal of thyroid.   MEDICATIONS: Reviewed   DIETARY INTAKE:   Usual eating pattern includes 3 meals and 2-3 snacks per day.  24-hr recall:  B ( AM): Breakfast wrap (whole wheat, egg beater/egg white, bacon, orange marmalade), milk  Snk (9:30 AM): Peanut butter crackers, water  L ( PM): Subway 6 inch [place] sub (cheese, lettuce/tomato/onion, oil/vinegar), chips, 75% unsweetened tea OR fish sandwich, fries, Sprite/Iced tea Snk ( PM): Sometimes, same D ( PM): Subway with unsweetened tea OR Chicken pot pie from [employer] OR Rice, Salmon OR BBQ, hush puppies, diet Sprite Snk ( PM): Same Beverages: Water, coffee, diet Sprite, iced tea (75% unsweetened)  Usual physical activity: Walking at work, outside work on weekends  Estimated energy needs: 1800 calories 225 g carbohydrates 113 g protein 50 g fat  Progress Towards Goal(s):  In progress.   Nutritional Diagnosis:  NB-1.1 Food and nutrition-related knowledge deficit As related to hemachromatosis.  As evidenced by no prior education.    Intervention:  Nutrition counseling. We discussed a low iron diet for hemachromatosis, including foods high and low in iron, the difference between heme and non-heme iron, and label reading. We also discussed how certain nutrients (calcium, vitamin C, fiber,  tannins) may impact absorption of iron. We also discussed general healthful eating habits for weight maintenance and exercise.   Goals:  1. Continue to limit foods high in iron. Read food label to limit enriched/fortified grains.  2. Eat a plant-based diet, moderating portions of protein and complex carbohydrates.  3. Increase fruit and vegetable intake.  4. Moderate exercise for 20-30 minutes 3 days weekly.   Handouts given during visit include:  Hemachromatosis diet recommendations (irondisorders.org)  Yellow meal plan card  Monitoring/Evaluation:  Dietary intake, exercise, ferratin, and body weight prn.

## 2013-11-19 ENCOUNTER — Telehealth: Payer: Self-pay | Admitting: Gastroenterology

## 2013-11-19 MED ORDER — CYANOCOBALAMIN 1000 MCG/ML IJ SOLN: INTRAMUSCULAR | Status: DC

## 2013-11-19 NOTE — Telephone Encounter (Signed)
Per [facility], pt needs a new order for his monthly B12 injections; entered new order. Informed pt to make an appt and call for problems.

## 2013-11-23 ENCOUNTER — Ambulatory Visit (INDEPENDENT_AMBULATORY_CARE_PROVIDER_SITE_OTHER): Payer: 59

## 2013-11-23 DIAGNOSIS — E538 Deficiency of other specified B group vitamins: Secondary | ICD-10-CM

## 2013-11-23 MED ORDER — CYANOCOBALAMIN 1000 MCG/ML IJ SOLN
1000.0000 ug | Freq: Once | INTRAMUSCULAR | Status: AC
  Administered 2013-11-23: 1000 ug via INTRAMUSCULAR

## 2013-12-24 ENCOUNTER — Ambulatory Visit (INDEPENDENT_AMBULATORY_CARE_PROVIDER_SITE_OTHER): Payer: 59 | Admitting: *Deleted

## 2013-12-24 DIAGNOSIS — E538 Deficiency of other specified B group vitamins: Secondary | ICD-10-CM

## 2013-12-24 MED ORDER — CYANOCOBALAMIN 1000 MCG/ML IJ SOLN
1000.0000 ug | Freq: Once | INTRAMUSCULAR | Status: AC
  Administered 2013-12-24: 1000 ug via INTRAMUSCULAR

## 2014-01-11 ENCOUNTER — Other Ambulatory Visit: Payer: Self-pay | Admitting: Family Medicine

## 2014-01-13 ENCOUNTER — Other Ambulatory Visit: Payer: Self-pay

## 2014-01-13 ENCOUNTER — Telehealth: Payer: Self-pay | Admitting: Gastroenterology

## 2014-01-13 DIAGNOSIS — D649 Anemia, unspecified: Secondary | ICD-10-CM

## 2014-01-13 MED ORDER — OXYCODONE-ACETAMINOPHEN 5-325 MG PO TABS: 2.0000 | ORAL_TABLET | Freq: Four times a day (QID) | ORAL | Status: DC | PRN

## 2014-01-13 NOTE — Telephone Encounter (Signed)
Pt left v/m requesting rx oxycodone apap/ call when ready for pick up; pt needing for pain due to kidney stone.Please advise.

## 2014-01-13 NOTE — Telephone Encounter (Signed)
I called patient back Per notes on lab info patient will be due Feb 10 for lab work Order in and patient notified I advised patient that he can come 01-19-14 to basement lab and they are open from 730-530 Patient verbalized understanding

## 2014-01-13 NOTE — Telephone Encounter (Signed)
Printed.  Thanks.  

## 2014-01-13 NOTE — Telephone Encounter (Signed)
Left message on voice mail  to call back

## 2014-01-14 NOTE — Telephone Encounter (Signed)
Patient notified prescription is ready to be picked up at front desk.

## 2014-01-14 NOTE — Telephone Encounter (Signed)
Pt left v/m requesting cb. 

## 2014-01-19 ENCOUNTER — Other Ambulatory Visit: Payer: Self-pay | Admitting: *Deleted

## 2014-01-19 ENCOUNTER — Other Ambulatory Visit (INDEPENDENT_AMBULATORY_CARE_PROVIDER_SITE_OTHER): Payer: 59

## 2014-01-19 DIAGNOSIS — D649 Anemia, unspecified: Secondary | ICD-10-CM

## 2014-01-19 LAB — FERRITIN: FERRITIN: 47.9 ng/mL (ref 22.0–322.0)

## 2014-01-19 LAB — HEMOGLOBIN AND HEMATOCRIT, BLOOD
HCT: 45.1 % (ref 39.0–52.0)
HEMOGLOBIN: 15.3 g/dL (ref 13.0–17.0)

## 2014-01-20 ENCOUNTER — Telehealth: Payer: Self-pay | Admitting: *Deleted

## 2014-01-20 NOTE — Telephone Encounter (Signed)
  Notes Recorded by Sable Feil, MD on 01/19/2014 at 2:43 PM Does not need phlebotomies for now. I would recheck hemoglobin and hematocrit and ferritin in 6 months.not 3 mos... ------  Notes Recorded by Sable Feil, MD on 01/19/2014 at 1:13 PM Ferritin level every 3 months. He does not need phlebotomy unless ferritin is over 100  Informed pt of results and that he will need labs again in 6 months and he will receive a phlebotomy if his Ferritin is > 100. We discussed the fact that he will need a new doctor and he will see Dr Henrene Pastor. Will send a reminder to Dr Blanch Media nurse to schedule an appt with Dr Henrene Pastor around August and Dr Henrene Pastor can decide on labs/phlebotomies. Pt stated understanding.

## 2014-01-21 ENCOUNTER — Ambulatory Visit (INDEPENDENT_AMBULATORY_CARE_PROVIDER_SITE_OTHER): Payer: 59 | Admitting: *Deleted

## 2014-01-21 DIAGNOSIS — E538 Deficiency of other specified B group vitamins: Secondary | ICD-10-CM

## 2014-01-21 MED ORDER — CYANOCOBALAMIN 1000 MCG/ML IJ SOLN
1000.0000 ug | Freq: Once | INTRAMUSCULAR | Status: AC
  Administered 2014-01-21: 1000 ug via INTRAMUSCULAR

## 2014-02-18 ENCOUNTER — Ambulatory Visit (INDEPENDENT_AMBULATORY_CARE_PROVIDER_SITE_OTHER): Payer: 59

## 2014-02-18 ENCOUNTER — Other Ambulatory Visit: Payer: Self-pay | Admitting: Family Medicine

## 2014-02-18 DIAGNOSIS — E538 Deficiency of other specified B group vitamins: Secondary | ICD-10-CM

## 2014-02-18 DIAGNOSIS — E0500 Thyrotoxicosis with diffuse goiter without thyrotoxic crisis or storm: Secondary | ICD-10-CM

## 2014-02-18 DIAGNOSIS — I10 Essential (primary) hypertension: Secondary | ICD-10-CM

## 2014-02-18 MED ORDER — CYANOCOBALAMIN 1000 MCG/ML IJ SOLN
1000.0000 ug | Freq: Once | INTRAMUSCULAR | Status: AC
  Administered 2014-02-18: 1000 ug via INTRAMUSCULAR

## 2014-02-23 ENCOUNTER — Other Ambulatory Visit (INDEPENDENT_AMBULATORY_CARE_PROVIDER_SITE_OTHER): Payer: 59

## 2014-02-23 DIAGNOSIS — E0500 Thyrotoxicosis with diffuse goiter without thyrotoxic crisis or storm: Secondary | ICD-10-CM

## 2014-02-23 DIAGNOSIS — I10 Essential (primary) hypertension: Secondary | ICD-10-CM

## 2014-02-23 DIAGNOSIS — E538 Deficiency of other specified B group vitamins: Secondary | ICD-10-CM

## 2014-02-23 LAB — LIPID PANEL
CHOL/HDL RATIO: 6
Cholesterol: 173 mg/dL (ref 0–200)
HDL: 28 mg/dL — ABNORMAL LOW (ref >39.00)
LDL CALC: 105 mg/dL — AB (ref 0–99)
Triglycerides: 202 mg/dL — ABNORMAL HIGH (ref 0.0–149.0)
VLDL: 40.4 mg/dL — ABNORMAL HIGH (ref 0.0–40.0)

## 2014-02-23 LAB — COMPREHENSIVE METABOLIC PANEL
ALBUMIN: 4.6 g/dL (ref 3.5–5.2)
ALK PHOS: 78 U/L (ref 39–117)
ALT: 24 U/L (ref 0–53)
AST: 29 U/L (ref 0–37)
BUN: 18 mg/dL (ref 6–23)
CO2: 28 mEq/L (ref 19–32)
Calcium: 9.4 mg/dL (ref 8.4–10.5)
Chloride: 101 mEq/L (ref 96–112)
Creatinine, Ser: 1.4 mg/dL (ref 0.4–1.5)
GFR: 56.44 mL/min — ABNORMAL LOW (ref >60.00)
Glucose, Bld: 141 mg/dL — ABNORMAL HIGH (ref 70–99)
POTASSIUM: 4.4 meq/L (ref 3.5–5.1)
SODIUM: 136 meq/L (ref 135–145)
TOTAL PROTEIN: 7.5 g/dL (ref 6.0–8.3)
Total Bilirubin: 0.9 mg/dL (ref 0.3–1.2)

## 2014-02-23 LAB — TSH: TSH: 0.31 u[IU]/mL — AB (ref 0.35–5.50)

## 2014-02-23 LAB — VITAMIN B12: Vitamin B-12: 658 pg/mL (ref 211–911)

## 2014-03-02 ENCOUNTER — Ambulatory Visit (INDEPENDENT_AMBULATORY_CARE_PROVIDER_SITE_OTHER): Payer: 59 | Admitting: Family Medicine

## 2014-03-02 ENCOUNTER — Encounter: Payer: Self-pay | Admitting: Family Medicine

## 2014-03-02 VITALS — BP 114/72 | HR 61 | Temp 97.6°F | Ht 68.0 in | Wt 179.8 lb

## 2014-03-02 DIAGNOSIS — R739 Hyperglycemia, unspecified: Secondary | ICD-10-CM

## 2014-03-02 DIAGNOSIS — Z23 Encounter for immunization: Secondary | ICD-10-CM

## 2014-03-02 DIAGNOSIS — I10 Essential (primary) hypertension: Secondary | ICD-10-CM

## 2014-03-02 DIAGNOSIS — E0500 Thyrotoxicosis with diffuse goiter without thyrotoxic crisis or storm: Secondary | ICD-10-CM

## 2014-03-02 DIAGNOSIS — Z2911 Encounter for prophylactic immunotherapy for respiratory syncytial virus (RSV): Secondary | ICD-10-CM

## 2014-03-02 DIAGNOSIS — E785 Hyperlipidemia, unspecified: Secondary | ICD-10-CM

## 2014-03-02 DIAGNOSIS — E119 Type 2 diabetes mellitus without complications: Secondary | ICD-10-CM | POA: Insufficient documentation

## 2014-03-02 DIAGNOSIS — R7309 Other abnormal glucose: Secondary | ICD-10-CM

## 2014-03-02 DIAGNOSIS — Z0000 Encounter for general adult medical examination without abnormal findings: Secondary | ICD-10-CM

## 2014-03-02 MED ORDER — LISINOPRIL 20 MG PO TABS: 20.0000 mg | ORAL_TABLET | Freq: Every day | ORAL | Status: DC

## 2014-03-02 MED ORDER — FENOFIBRATE 160 MG PO TABS: 160.0000 mg | ORAL_TABLET | Freq: Every day | ORAL | Status: DC

## 2014-03-02 MED ORDER — OMEPRAZOLE 40 MG PO CPDR: 40.0000 mg | DELAYED_RELEASE_CAPSULE | Freq: Every day | ORAL | Status: DC

## 2014-03-02 NOTE — Progress Notes (Signed)
Pre visit review using our clinic review tool, if applicable. No additional management support is needed unless otherwise documented below in the visit note.  CPE- See plan.  Routine anticipatory guidance given to patient.  See health maintenance. Tetanus 2009  Flu done at work fall 2014 Shingles shot d/w pt.   PNA shot d/w pt.   Doesn't appear to be due until age 58 Colonoscopy 2013  Prostate cancer screening and PSA options (with potential risks and benefits of testing vs not testing) were discussed along with recent recs/guidelines.  He declined testing PSA at this point. Living will d/w pt. Would have his wife designated if pt were incapacitated.  Diet and exercise d/w pt. He's working on both, esp diet with hemochromatosis.   He'll occ feel like a rib is catching when he is putting on his shoes.  No jaundice, no vomiting, no diarrhea, no RUQ o/w.  No other abd sx.    H/o Grave's disease s/p ablation with minimally low TSH now.  D/w pt.  No ADE on meds.  No neck mass.  No dysphagia.  Per Dr. Wilson Singer.    H/o B 12 def.  Labs wnl.    Elevated Cholesterol: Using medications without problems: yes.  No ADE on meds.  Diet compliance:yes Exercise:as above, encouraged more  Hypertension:    Using medication without problems or lightheadedness: yes Chest pain with exertion:no Edema:no Short of breath:no  Sugar elevation noted.  FH but no personal hx of diabetes.  He was drinking some soda recently.  He was fasting on the labs but the night before he had some juice before bed.  This may have altered his AM reading.    PMH and SH reviewed  Meds, vitals, and allergies reviewed.   ROS: See HPI.  Otherwise negative.    GEN: nad, alert and oriented HEENT: mucous membranes moist NECK: supple w/o LA, no TMG on exam.  CV: rrr. PULM: ctab, no inc wob ABD: soft, +bs EXT: no edema SKIN: no acute rash

## 2014-03-02 NOTE — Assessment & Plan Note (Signed)
Controlled, continue as is.  

## 2014-03-02 NOTE — Patient Instructions (Signed)
Go to the lab on the way out.  We'll contact you with your lab report. Cut back on sprite and try to exercise a little more.  Recheck in 1 year, sooner if needed.  Take care.  Glad to see you.

## 2014-03-02 NOTE — Assessment & Plan Note (Signed)
Per endo °

## 2014-03-02 NOTE — Assessment & Plan Note (Signed)
Elevated TG, continue med and he'll work on diet and sugar.

## 2014-03-02 NOTE — Assessment & Plan Note (Signed)
Per GI

## 2014-03-02 NOTE — Assessment & Plan Note (Signed)
Routine anticipatory guidance given to patient.  See health maintenance. Tetanus 2009  Flu done at work fall 2014 Shingles shot d/w pt.   PNA shot d/w pt.   Doesn't appear to be due until age 58 Colonoscopy 2013  Prostate cancer screening and PSA options (with potential risks and benefits of testing vs not testing) were discussed along with recent recs/guidelines.  He declined testing PSA at this point. Living will d/w pt. Would have his wife designated if pt were incapacitated.  Diet and exercise d/w pt. He's working on both, esp diet with hemochromatosis.

## 2014-03-03 ENCOUNTER — Encounter: Payer: Self-pay | Admitting: Family Medicine

## 2014-03-03 ENCOUNTER — Other Ambulatory Visit: Payer: Self-pay | Admitting: Family Medicine

## 2014-03-03 DIAGNOSIS — E119 Type 2 diabetes mellitus without complications: Secondary | ICD-10-CM

## 2014-03-03 LAB — HEMOGLOBIN A1C: Hgb A1c MFr Bld: 6.8 % — ABNORMAL HIGH (ref 4.6–6.5)

## 2014-03-03 NOTE — Assessment & Plan Note (Signed)
A1c pending.  D/w pt about diet and exercise.  See notes on labs.

## 2014-03-17 ENCOUNTER — Telehealth: Payer: Self-pay | Admitting: Internal Medicine

## 2014-03-17 NOTE — Telephone Encounter (Signed)
Patient advised to discuss with Dr. Damita Dunnings as he has just drawn a B12 level.  He will check with Dr. Damita Dunnings.  He is scheduled for B12 injections with Dr. Josefine Class office for tomorrow and in May.  He will come in and see Dr. Henrene Pastor to establish care for hemochromatosis on 05/10/14

## 2014-03-18 ENCOUNTER — Ambulatory Visit (INDEPENDENT_AMBULATORY_CARE_PROVIDER_SITE_OTHER): Payer: 59

## 2014-03-18 ENCOUNTER — Telehealth: Payer: Self-pay

## 2014-03-18 DIAGNOSIS — E538 Deficiency of other specified B group vitamins: Secondary | ICD-10-CM | POA: Insufficient documentation

## 2014-03-18 MED ORDER — CYANOCOBALAMIN 1000 MCG/ML IJ SOLN
1000.0000 ug | Freq: Once | INTRAMUSCULAR | Status: AC
  Administered 2014-03-18: 1000 ug via INTRAMUSCULAR

## 2014-03-18 NOTE — Telephone Encounter (Signed)
Pt said Dr Sharlett Iles had ordered B 12 shots but when pt contacted [place] office was told to ck with PCP to ask how long pt will be continuing the vit b 12 injections. Pt request cb with how long need to take B12  Injections or when should have next lab test. Please advise.

## 2014-03-18 NOTE — Telephone Encounter (Signed)
I thought this was coming through GI.   Here's what I would do-  Since his last level was up to normal, I would continue with monthly injections for another 2 months. I would recheck a trough level, about 1 month after that 2nd injection.  If his level stays normal, and similar to the last result, then I would continue as is (monthly) to keep his level normal.   If his level has gone up significantly, then I would stop the injections, likely change over to oral B12. And then recheck a level in the future.  This way he won't be likely to drop back down in the near future.   Order is in. Thanks.

## 2014-03-19 NOTE — Telephone Encounter (Signed)
Patient advised.  Appts for B-12 injections and lab scheduled.

## 2014-04-16 ENCOUNTER — Encounter: Payer: Self-pay | Admitting: Family Medicine

## 2014-04-16 ENCOUNTER — Ambulatory Visit (INDEPENDENT_AMBULATORY_CARE_PROVIDER_SITE_OTHER): Payer: 59 | Admitting: Family Medicine

## 2014-04-16 VITALS — BP 104/70 | HR 88 | Temp 98.0°F | Wt 172.5 lb

## 2014-04-16 DIAGNOSIS — R059 Cough, unspecified: Secondary | ICD-10-CM

## 2014-04-16 DIAGNOSIS — R05 Cough: Secondary | ICD-10-CM

## 2014-04-16 MED ORDER — ALBUTEROL SULFATE HFA 108 (90 BASE) MCG/ACT IN AERS: 1.0000 | INHALATION_SPRAY | Freq: Four times a day (QID) | RESPIRATORY_TRACT | Status: DC | PRN

## 2014-04-16 MED ORDER — AZITHROMYCIN 250 MG PO TABS: ORAL_TABLET | ORAL | Status: DC

## 2014-04-16 NOTE — Patient Instructions (Signed)
Take care. Glad to see you.  Use the inhaler for the wheeze as needed and start the antibiotics today.  Try to get some rest and drink plenty of fluids.

## 2014-04-16 NOTE — Progress Notes (Signed)
Pre visit review using our clinic review tool, if applicable. No additional management support is needed unless otherwise documented below in the visit note.  Sx started about 6 days ago.  Started with no ST but had sinus drainage. Cough and sputum.  Post nasal gtt.  No fevers. Dark brown/green sputum. Occ wheeze, this is atypical.  No NAVD.  No ear pain.  No facial pain now- tylenol helps.  He thought he was getting better initially then worsened vs leveled off.  Fatigued.    Meds, vitals, and allergies reviewed.   ROS: See HPI.  Otherwise, noncontributory.  Nad ncat Tm wnl Nasal exam stuffy and irritated B OP with cobblestoning Sinuses not ttp Neck supple, no LA rrr ctab with occ cough noted No focal dec in BS Ext well perfused

## 2014-04-18 DIAGNOSIS — R05 Cough: Secondary | ICD-10-CM | POA: Insufficient documentation

## 2014-04-18 DIAGNOSIS — R053 Chronic cough: Secondary | ICD-10-CM | POA: Insufficient documentation

## 2014-04-18 DIAGNOSIS — R059 Cough, unspecified: Secondary | ICD-10-CM | POA: Insufficient documentation

## 2014-04-18 DIAGNOSIS — R051 Acute cough: Secondary | ICD-10-CM | POA: Insufficient documentation

## 2014-04-18 NOTE — Assessment & Plan Note (Signed)
Presumed bronchitis, SABA with zmax in the meantime.  F/u prn. Nontoxic. He agrees.  supportive care ow.

## 2014-04-22 ENCOUNTER — Telehealth: Payer: Self-pay

## 2014-04-22 ENCOUNTER — Ambulatory Visit (INDEPENDENT_AMBULATORY_CARE_PROVIDER_SITE_OTHER): Payer: 59

## 2014-04-22 DIAGNOSIS — E538 Deficiency of other specified B group vitamins: Secondary | ICD-10-CM

## 2014-04-22 MED ORDER — CYANOCOBALAMIN 1000 MCG/ML IJ SOLN
1000.0000 ug | Freq: Once | INTRAMUSCULAR | Status: AC
  Administered 2014-04-22: 1000 ug via INTRAMUSCULAR

## 2014-04-22 NOTE — Telephone Encounter (Signed)
Pt was in office today for B 12 injection and pt said has finished antibiotic and is feeling better but still having prod cough with clear phlegm and  occas wheezing;. No fever,SOB or CP. [facility] [address] Pt request cb.

## 2014-04-22 NOTE — Telephone Encounter (Signed)
Please call pt.  I would expect the cough to gradually resolve.  I would give this a little longer. Use the albuterol prn in the meantime- his need for that should gradually decrease.  Thanks.

## 2014-04-22 NOTE — Telephone Encounter (Signed)
Left detailed voicemail with Dr. Josefine Class comments

## 2014-05-10 ENCOUNTER — Encounter: Payer: Self-pay | Admitting: Internal Medicine

## 2014-05-10 ENCOUNTER — Other Ambulatory Visit (INDEPENDENT_AMBULATORY_CARE_PROVIDER_SITE_OTHER): Payer: 59

## 2014-05-10 ENCOUNTER — Ambulatory Visit (INDEPENDENT_AMBULATORY_CARE_PROVIDER_SITE_OTHER): Payer: 59 | Admitting: Internal Medicine

## 2014-05-10 DIAGNOSIS — K219 Gastro-esophageal reflux disease without esophagitis: Secondary | ICD-10-CM

## 2014-05-10 LAB — CBC WITH DIFFERENTIAL/PLATELET
Basophils Absolute: 0 10*3/uL (ref 0.0–0.1)
Basophils Relative: 0.5 % (ref 0.0–3.0)
EOS PCT: 4.9 % (ref 0.0–5.0)
Eosinophils Absolute: 0.3 10*3/uL (ref 0.0–0.7)
HCT: 42.7 % (ref 39.0–52.0)
HEMOGLOBIN: 14.8 g/dL (ref 13.0–17.0)
LYMPHS PCT: 25.3 % (ref 12.0–46.0)
Lymphs Abs: 1.4 10*3/uL (ref 0.7–4.0)
MCHC: 34.7 g/dL (ref 30.0–36.0)
MCV: 90.6 fl (ref 78.0–100.0)
MONOS PCT: 11.1 % (ref 3.0–12.0)
Monocytes Absolute: 0.6 10*3/uL (ref 0.1–1.0)
Neutro Abs: 3.2 10*3/uL (ref 1.4–7.7)
Neutrophils Relative %: 58.2 % (ref 43.0–77.0)
Platelets: 176 10*3/uL (ref 150.0–400.0)
RBC: 4.71 Mil/uL (ref 4.22–5.81)
RDW: 12.9 % (ref 11.5–15.5)
WBC: 5.4 10*3/uL (ref 4.0–10.5)

## 2014-05-10 LAB — HEPATIC FUNCTION PANEL
ALT: 26 U/L (ref 0–53)
AST: 33 U/L (ref 0–37)
Albumin: 4.2 g/dL (ref 3.5–5.2)
Alkaline Phosphatase: 92 U/L (ref 39–117)
Bilirubin, Direct: 0.2 mg/dL (ref 0.0–0.3)
TOTAL PROTEIN: 7.1 g/dL (ref 6.0–8.3)
Total Bilirubin: 0.7 mg/dL (ref 0.2–1.2)

## 2014-05-10 LAB — FERRITIN: Ferritin: 20.5 ng/mL — ABNORMAL LOW (ref 22.0–322.0)

## 2014-05-10 NOTE — Patient Instructions (Signed)
Your physician has requested that you go to the basement for the following lab work before leaving today:  Lfts, ferritin, cbc \

## 2014-05-10 NOTE — Progress Notes (Signed)
HISTORY OF PRESENT ILLNESS:  Ryan Crane is a 58 y.o. male , former patient of Dr. Verl Blalock, who is followed for GERD, hereditary hemachromatosis, and colon cancer screening. He was last seen by Dr. Sharlett Iles October 2014. See that dictation. He reports his last phlebotomy about 6 months ago. He is due for followup phlebotomy this summer. Ferritin February 2015 was 47.9. Liver tests 02/23/2014 were normal. No recent CBC. Patient has no GI complaints. No relevant non-GI complaints. For GERD he takes omeprazole 40 mg daily with excellent control symptoms. He did undergo upper endoscopy in March of 2013 which revealed fundic gland polyps but was otherwise normal. His last colonoscopy at that same time was normal except for internal hemorrhoids. Followup in 10 years recommended  REVIEW OF SYSTEMS:  All non-GI ROS negative except for sinus and allergy trouble  Past Medical History  Diagnosis Date  . Allergy   . Hypertension   . Arthritis   . GERD (gastroesophageal reflux disease)   . Hyperlipidemia   . Personal history of colonic polyps 03/31/2008    tubular adenoma  . History of chicken pox   . Family history of malignant neoplasm of gastrointestinal tract   . Hemochromatosis   . Kidney stones    . Graves disease     s/p ablation  . Right fibular fracture 2013  . Internal hemorrhoids     Past Surgical History  Procedure Laterality Date  . Lithotripsy    . Ureteral stent placement      left    Social History Ryan Crane  reports that he has never smoked. He has never used smokeless tobacco. He reports that he does not drink alcohol or use illicit drugs.  family history includes Arthritis in his mother; COPD in his mother; Colon cancer (age of onset: 23) in his maternal grandmother; Diabetes in his mother; Hypertension in his mother; Lung cancer in his father. There is no history of Prostate cancer, Esophageal cancer, Rectal cancer, or Stomach cancer.  Allergies   Allergen Reactions  . Flomax [Tamsulosin Hcl]     nausea       PHYSICAL EXAMINATION: Vital signs: BP 110/72  Pulse 60  Ht 5' 6.53" (1.69 m)  Wt 171 lb 2 oz (77.622 kg)  BMI 27.18 kg/m2  Constitutional: generally well-appearing, no acute distress Psychiatric: alert and oriented x3, cooperative Eyes: extraocular movements intact, anicteric, conjunctiva pink Mouth: oral pharynx moist, no lesions Neck: supple no lymphadenopathy Cardiovascular: heart regular rate and rhythm, no murmur Lungs: clear to auscultation bilaterally Abdomen: soft, nontender, nondistended, no obvious ascites, no peritoneal signs, normal bowel sounds, no organomegaly Rectal: Omitted Extremities: no lower extremity edema bilaterally Skin: no lesions on visible extremities Neuro: No focal deficits. No asterixis.    ASSESSMENT:  #1. Hereditary hemachromatosis. Adequately phlebotomized. No clinical sequela. Low-normal ferritin and LFTs #2. GERD. Controlled with omeprazole #3. Normal screening colonoscopy March 2013   PLAN:  #1. CBC, ferritin, LFTs #2. Continue reflux precautions and omeprazole. Omeprazole prescription refilled as requested #3. Keep plans for phlebotomy this summer. #4. Routine GI followup one year #5. Surveillance colonoscopy around 2023

## 2014-05-24 ENCOUNTER — Ambulatory Visit (INDEPENDENT_AMBULATORY_CARE_PROVIDER_SITE_OTHER): Payer: 59 | Admitting: *Deleted

## 2014-05-24 DIAGNOSIS — E538 Deficiency of other specified B group vitamins: Secondary | ICD-10-CM

## 2014-05-24 MED ORDER — CYANOCOBALAMIN 1000 MCG/ML IJ SOLN
1000.0000 ug | Freq: Once | INTRAMUSCULAR | Status: AC
  Administered 2014-05-24: 1000 ug via INTRAMUSCULAR

## 2014-06-07 ENCOUNTER — Other Ambulatory Visit (INDEPENDENT_AMBULATORY_CARE_PROVIDER_SITE_OTHER): Payer: 59

## 2014-06-07 DIAGNOSIS — E538 Deficiency of other specified B group vitamins: Secondary | ICD-10-CM

## 2014-06-07 DIAGNOSIS — E119 Type 2 diabetes mellitus without complications: Secondary | ICD-10-CM

## 2014-06-07 LAB — HEMOGLOBIN A1C: Hgb A1c MFr Bld: 6.4 % (ref 4.6–6.5)

## 2014-06-07 LAB — VITAMIN B12: VITAMIN B 12: 463 pg/mL (ref 211–911)

## 2014-06-08 ENCOUNTER — Other Ambulatory Visit: Payer: Self-pay | Admitting: Family Medicine

## 2014-06-08 DIAGNOSIS — R739 Hyperglycemia, unspecified: Secondary | ICD-10-CM

## 2014-06-24 ENCOUNTER — Ambulatory Visit (INDEPENDENT_AMBULATORY_CARE_PROVIDER_SITE_OTHER): Payer: 59

## 2014-06-24 DIAGNOSIS — E538 Deficiency of other specified B group vitamins: Secondary | ICD-10-CM

## 2014-06-24 MED ORDER — CYANOCOBALAMIN 1000 MCG/ML IJ SOLN
1000.0000 ug | Freq: Once | INTRAMUSCULAR | Status: AC
  Administered 2014-06-24: 1000 ug via INTRAMUSCULAR

## 2014-06-28 ENCOUNTER — Other Ambulatory Visit (INDEPENDENT_AMBULATORY_CARE_PROVIDER_SITE_OTHER): Payer: 59

## 2014-06-28 DIAGNOSIS — R739 Hyperglycemia, unspecified: Secondary | ICD-10-CM

## 2014-06-28 DIAGNOSIS — E538 Deficiency of other specified B group vitamins: Secondary | ICD-10-CM

## 2014-06-28 LAB — FERRITIN: Ferritin: 23.5 ng/mL (ref 22.0–322.0)

## 2014-06-28 LAB — VITAMIN B12: VITAMIN B 12: 740 pg/mL (ref 211–911)

## 2014-06-28 LAB — HEMOGLOBIN A1C: Hgb A1c MFr Bld: 6.3 % (ref 4.6–6.5)

## 2014-06-30 ENCOUNTER — Telehealth: Payer: Self-pay | Admitting: Internal Medicine

## 2014-06-30 ENCOUNTER — Other Ambulatory Visit: Payer: Self-pay

## 2014-06-30 ENCOUNTER — Telehealth: Payer: Self-pay | Admitting: Radiology

## 2014-06-30 NOTE — Telephone Encounter (Signed)
Patient notified of his results. 

## 2014-06-30 NOTE — Telephone Encounter (Signed)
Pt aware and order in epic for phlebotomy.

## 2014-07-01 ENCOUNTER — Other Ambulatory Visit (INDEPENDENT_AMBULATORY_CARE_PROVIDER_SITE_OTHER): Payer: 59

## 2014-07-01 ENCOUNTER — Other Ambulatory Visit: Payer: Self-pay

## 2014-07-01 LAB — CBC WITH DIFFERENTIAL/PLATELET
Basophils Absolute: 0 10*3/uL (ref 0.0–0.1)
Basophils Relative: 0.3 % (ref 0.0–3.0)
EOS PCT: 5.4 % — AB (ref 0.0–5.0)
Eosinophils Absolute: 0.3 10*3/uL (ref 0.0–0.7)
HCT: 40.7 % (ref 39.0–52.0)
Hemoglobin: 14.4 g/dL (ref 13.0–17.0)
Lymphocytes Relative: 26.7 % (ref 12.0–46.0)
Lymphs Abs: 1.3 10*3/uL (ref 0.7–4.0)
MCHC: 35.5 g/dL (ref 30.0–36.0)
MCV: 90.3 fl (ref 78.0–100.0)
MONO ABS: 0.4 10*3/uL (ref 0.1–1.0)
Monocytes Relative: 8.5 % (ref 3.0–12.0)
NEUTROS PCT: 59.1 % (ref 43.0–77.0)
Neutro Abs: 2.9 10*3/uL (ref 1.4–7.7)
PLATELETS: 173 10*3/uL (ref 150.0–400.0)
RBC: 4.5 Mil/uL (ref 4.22–5.81)
RDW: 13.6 % (ref 11.5–15.5)
WBC: 4.9 10*3/uL (ref 4.0–10.5)

## 2014-07-01 NOTE — Telephone Encounter (Signed)
That is fine. Please check CBC, ferritin, and LFTs in 6 months. Possible phlebotomy at that time. Thanks

## 2014-07-22 ENCOUNTER — Ambulatory Visit (INDEPENDENT_AMBULATORY_CARE_PROVIDER_SITE_OTHER): Payer: 59

## 2014-07-22 DIAGNOSIS — E538 Deficiency of other specified B group vitamins: Secondary | ICD-10-CM

## 2014-07-22 MED ORDER — CYANOCOBALAMIN 1000 MCG/ML IJ SOLN
1000.0000 ug | Freq: Once | INTRAMUSCULAR | Status: AC
  Administered 2014-07-22: 1000 ug via INTRAMUSCULAR

## 2014-07-29 ENCOUNTER — Ambulatory Visit: Payer: 59 | Admitting: Internal Medicine

## 2014-08-19 ENCOUNTER — Ambulatory Visit (INDEPENDENT_AMBULATORY_CARE_PROVIDER_SITE_OTHER): Payer: 59

## 2014-08-19 DIAGNOSIS — E538 Deficiency of other specified B group vitamins: Secondary | ICD-10-CM

## 2014-08-19 MED ORDER — CYANOCOBALAMIN 1000 MCG/ML IJ SOLN
1000.0000 ug | Freq: Once | INTRAMUSCULAR | Status: AC
  Administered 2014-08-19: 1000 ug via INTRAMUSCULAR

## 2014-09-13 ENCOUNTER — Ambulatory Visit (INDEPENDENT_AMBULATORY_CARE_PROVIDER_SITE_OTHER): Payer: 59 | Admitting: Family Medicine

## 2014-09-13 ENCOUNTER — Encounter: Payer: Self-pay | Admitting: Family Medicine

## 2014-09-13 VITALS — BP 108/68 | HR 74 | Temp 98.0°F | Wt 172.0 lb

## 2014-09-13 DIAGNOSIS — J069 Acute upper respiratory infection, unspecified: Secondary | ICD-10-CM

## 2014-09-13 MED ORDER — ALBUTEROL SULFATE HFA 108 (90 BASE) MCG/ACT IN AERS: 1.0000 | INHALATION_SPRAY | Freq: Four times a day (QID) | RESPIRATORY_TRACT | Status: DC | PRN

## 2014-09-13 MED ORDER — DOXYCYCLINE HYCLATE 100 MG PO TABS: 100.0000 mg | ORAL_TABLET | Freq: Two times a day (BID) | ORAL | Status: DC

## 2014-09-13 NOTE — Patient Instructions (Signed)
Use the inhaler, try to get some rest, drink plenty of fluids.  Start the antibiotics in a few days if not better.

## 2014-09-13 NOTE — Assessment & Plan Note (Signed)
Likely viral, nontoxic. Would use SABA for a few days.  If continues to have purulent sputum with prolonged sx then star doxy with routine cautions.  He agrees.  F/u prn.

## 2014-09-13 NOTE — Progress Notes (Signed)
Pre visit review using our clinic review tool, if applicable. No additional management support is needed unless otherwise documented below in the visit note.  Recently sick in the last week.  ST initially, now with postnasal gtt, cough, sputum.  No fevers, no ear pain.  Diffuse aches.  Not SOB.  Out of SABA, hasn't used recently.  No vomiting, no diarrhea.  No sick contacts.   Meds, vitals, and allergies reviewed.   ROS: See HPI.  Otherwise, noncontributory.  GEN: nad, alert and oriented HEENT: mucous membranes moist, tm w/o erythema, nasal exam w/o erythema, clear discharge noted,  OP with cobblestoning, sinuses not ttp NECK: supple w/o LA CV: rrr.   PULM: ctab, no inc wob, dry cough noted.  No focal dec in BS EXT: no edema SKIN: no acute rash

## 2014-09-21 ENCOUNTER — Ambulatory Visit (INDEPENDENT_AMBULATORY_CARE_PROVIDER_SITE_OTHER): Payer: 59

## 2014-09-21 DIAGNOSIS — E538 Deficiency of other specified B group vitamins: Secondary | ICD-10-CM

## 2014-09-21 MED ORDER — CYANOCOBALAMIN 1000 MCG/ML IJ SOLN
1000.0000 ug | Freq: Once | INTRAMUSCULAR | Status: AC
  Administered 2014-09-21: 1000 ug via INTRAMUSCULAR

## 2014-10-01 ENCOUNTER — Telehealth: Payer: Self-pay

## 2014-10-01 MED ORDER — BENZONATATE 200 MG PO CAPS: 200.0000 mg | ORAL_CAPSULE | Freq: Three times a day (TID) | ORAL | Status: DC | PRN

## 2014-10-01 NOTE — Telephone Encounter (Signed)
Pt was seen 09/13/14; pt is presently taking antibiotic but dry hacky cough lingering. No fever, wheezing or SOB.Pt request med for cough to [facility] [address]. Pt has not taken OTC cough med.Please advise.

## 2014-10-01 NOTE — Telephone Encounter (Signed)
Spoke to pt and informed him Rx sent to requested pharmacy. Pt states that he will contact office should s/s not subside

## 2014-10-01 NOTE — Telephone Encounter (Signed)
rx for tessalon sent.  That should help.  If not, then notify me.  Thanks.

## 2014-10-10 ENCOUNTER — Other Ambulatory Visit: Payer: Self-pay | Admitting: Family Medicine

## 2014-10-10 DIAGNOSIS — R739 Hyperglycemia, unspecified: Secondary | ICD-10-CM

## 2014-10-11 ENCOUNTER — Other Ambulatory Visit (INDEPENDENT_AMBULATORY_CARE_PROVIDER_SITE_OTHER): Payer: 59

## 2014-10-11 DIAGNOSIS — R739 Hyperglycemia, unspecified: Secondary | ICD-10-CM

## 2014-10-11 LAB — HEMOGLOBIN A1C: HEMOGLOBIN A1C: 6.2 % (ref 4.6–6.5)

## 2014-10-11 NOTE — Addendum Note (Signed)
Addended by: Ellamae Sia on: 10/11/2014 10:03 AM   Modules accepted: Orders

## 2014-10-18 ENCOUNTER — Encounter: Payer: Self-pay | Admitting: Family Medicine

## 2014-10-18 ENCOUNTER — Ambulatory Visit (INDEPENDENT_AMBULATORY_CARE_PROVIDER_SITE_OTHER): Payer: 59 | Admitting: Family Medicine

## 2014-10-18 VITALS — BP 102/68 | HR 60 | Temp 98.4°F | Wt 172.5 lb

## 2014-10-18 DIAGNOSIS — R739 Hyperglycemia, unspecified: Secondary | ICD-10-CM

## 2014-10-18 NOTE — Progress Notes (Signed)
Pre visit review using our clinic review tool, if applicable. No additional management support is needed unless otherwise documented below in the visit note.  Hyperglycemia.  Not DM2 now.  A1c <6.5 mult times now.  Cut out soda and sig weight loss noted.  Exercise d/w pt.  Walking a lot.  A1c d/w pt.   Work is going well.    Hemachromatosis.  Not yet due for labs.  D/w pt, will be due in a few months. He still has some occ twinges in his RUQ with leaning over, ie when tying his shoes.   Straightening up helps.  No jaundice, no vomiting.    Meds, vitals, and allergies reviewed.   ROS: See HPI.  Otherwise, noncontributory.  GEN: nad, alert and oriented, no juandice, no tremor HEENT: mucous membranes moist NECK: supple w/o LA CV: rrr PULM: ctab, no inc wob ABD: soft, +bs, abd not ttp, RUQ not ttp.  EXT: no edema SKIN: no acute rash

## 2014-10-18 NOTE — Patient Instructions (Signed)
Keep the physical in 03/2015.  Lab appointment (nonfasting) in 12/2014.  Take care.  Glad to see you.

## 2014-10-20 NOTE — Assessment & Plan Note (Signed)
Not yet due for labs.  D/w pt, will be due in a few months. He still has some occ twinges in his RUQ with leaning over, ie when tying his shoes.   Straightening up helps.  No jaundice, no vomiting.  The pain appears to be MSK and not ominous.  It is noted, but not intervened upon.  Will follow clinically.  He agrees.

## 2014-10-20 NOTE — Assessment & Plan Note (Signed)
Not DM2 now.  A1c <6.5 mult times now.  He cut out soda and sig weight loss noted.  Exercise d/w pt.  Walking a lot.  A1c d/w pt.  Continue as is.  We can recheck periodically.

## 2014-10-27 ENCOUNTER — Ambulatory Visit (INDEPENDENT_AMBULATORY_CARE_PROVIDER_SITE_OTHER): Payer: 59

## 2014-10-27 DIAGNOSIS — E538 Deficiency of other specified B group vitamins: Secondary | ICD-10-CM

## 2014-10-27 MED ORDER — CYANOCOBALAMIN 1000 MCG/ML IJ SOLN
1000.0000 ug | Freq: Once | INTRAMUSCULAR | Status: AC
  Administered 2014-10-27: 1000 ug via INTRAMUSCULAR

## 2014-11-18 ENCOUNTER — Encounter: Payer: Self-pay | Admitting: Internal Medicine

## 2014-11-18 ENCOUNTER — Ambulatory Visit (INDEPENDENT_AMBULATORY_CARE_PROVIDER_SITE_OTHER): Payer: 59 | Admitting: Internal Medicine

## 2014-11-18 VITALS — BP 108/68 | HR 69 | Temp 98.4°F | Wt 173.0 lb

## 2014-11-18 DIAGNOSIS — R0982 Postnasal drip: Secondary | ICD-10-CM

## 2014-11-18 DIAGNOSIS — J040 Acute laryngitis: Secondary | ICD-10-CM

## 2014-11-18 NOTE — Patient Instructions (Signed)
Laryngitis °At the top of your windpipe is your voice box. It is the source of your voice. Inside your voice box are 2 bands of muscles called vocal cords. When you breathe, your vocal cords are relaxed and open so that air can get into the lungs. When you decide to say something, these cords come together and vibrate. The sound from these vibrations goes into your throat and comes out through your mouth as sound.  °Laryngitis is an inflammation of the vocal cords that causes hoarseness, cough, loss of voice, sore throat, and dry throat. Laryngitis can be temporary (acute) or long-term (chronic). Most cases of acute laryngitis improve with time.Chronic laryngitis lasts for more than 3 weeks. °CAUSES °Laryngitis can often be related to excessive smoking, talking, or yelling, as well as inhalation of toxic fumes and allergies. Acute laryngitis is usually caused by a viral infection, vocal strain, measles or mumps, or bacterial infections. Chronic laryngitis is usually caused by vocal cord strain, vocal cord injury, postnasal drip, growths on the vocal cords, or acid reflux. °SYMPTOMS  °· Cough. °· Sore throat. °· Dry throat. °RISK FACTORS °· Respiratory infections. °· Exposure to irritating substances, such as cigarette smoke, excessive amounts of alcohol, stomach acids, and workplace chemicals. °· Voice trauma, such as vocal cord injury from shouting or speaking too loud. °DIAGNOSIS  °Your cargiver will perform a physical exam. During the physical exam, your caregiver will examine your throat. The most common sign of laryngitis is hoarseness. Laryngoscopy may be necessary to confirm the diagnosis of this condition. This procedure allows your caregiver to look into the larynx. °HOME CARE INSTRUCTIONS °· Drink enough fluids to keep your urine clear or pale yellow. °· Rest until you no longer have symptoms or as directed by your caregiver. °· Breathe in moist air. °· Take all medicine as directed by your  caregiver. °· Do not smoke. °· Talk as little as possible (this includes whispering). °· Write on paper instead of talking until your voice is back to normal. °· Follow up with your caregiver if your condition has not improved after 10 days. °SEEK MEDICAL CARE IF:  °· You have trouble breathing. °· You cough up blood. °· You have persistent fever. °· You have increasing pain. °· You have difficulty swallowing. °MAKE SURE YOU: °· Understand these instructions. °· Will watch your condition. °· Will get help right away if you are not doing well or get worse. °Document Released: 11/26/2005 Document Revised: 02/18/2012 Document Reviewed: 02/01/2011 °[employer]® Patient Information ©2015 [employer]. This information is not intended to replace advice given to you by your health care provider. Make sure you discuss any questions you have with your health care provider. ° °

## 2014-11-18 NOTE — Progress Notes (Signed)
HPI  Pt presents to the clinic today with c/o nasal congestion and hoarseness. He reports this started 4 days ago. He is blowing clear mucous from his nose. He denies fever, chills, facial pain, or cough. He has tried Vicks without much relief. He does have allergies but reports he has not taken his zyrtec in a while. He has had sick contacts.  Review of Systems      Past Medical History  Diagnosis Date  . Allergy   . Hypertension   . Arthritis   . GERD (gastroesophageal reflux disease)   . Hyperlipidemia   . Personal history of colonic polyps 03/31/2008    tubular adenoma  . History of chicken pox   . Family history of malignant neoplasm of gastrointestinal tract   . Hemochromatosis   . Kidney stones    . Graves disease     s/p ablation  . Right fibular fracture 2013  . Internal hemorrhoids     Family History  Problem Relation Age of Onset  . COPD Mother   . Arthritis Mother   . Diabetes Mother   . Hypertension Mother   . Lung cancer Father   . Colon cancer Maternal Grandmother 64  . Prostate cancer Neg Hx   . Esophageal cancer Neg Hx   . Rectal cancer Neg Hx   . Stomach cancer Neg Hx     History   Social History  . Marital Status: Married    Spouse Name: N/A    Number of Children: 1  . Years of Education: N/A   Occupational History  . PROJECT MANAGER   . PROJECT ENGINE    Social History Main Topics  . Smoking status: Never Smoker   . Smokeless tobacco: Never Used  . Alcohol Use: No  . Drug Use: No  . Sexual Activity: Yes   Other Topics Concern  . Not on file   Social History Narrative   2 years of college   Manager at [employer]   Married 1977   1 daughter    Allergies  Allergen Reactions  . Flomax [Tamsulosin Hcl]     nausea     Constitutional: Denies headache, fatigue and fever. HEENT:  Positive nasal congestion, sore throat. Denies eye redness, eye pain, pressure behind the eyes, facial pain, ear pain, ringing in the ears, wax buildup,  runny nose or bloody nose. Respiratory: Denies cough,  difficulty breathing or shortness of breath.  Cardiovascular: Denies chest pain, chest tightness, palpitations or swelling in the hands or feet.   No other specific complaints in a complete review of systems (except as listed in HPI above).  Objective:   BP 108/68 mmHg  Pulse 69  Temp(Src) 98.4 F (36.9 C) (Oral)  Wt 173 lb (78.472 kg)  SpO2 98% Wt Readings from Last 3 Encounters:  11/18/14 173 lb (78.472 kg)  10/18/14 172 lb 8 oz (78.245 kg)  09/13/14 172 lb (78.019 kg)     General: Appears his stated age, well developed, well nourished in NAD. HEENT: Head: normal shape and size, no sinus tenderness noted;  Ears: Tm's gray and intact, normal light reflex; Nose: mucosa pink and moist, septum midline; Throat/Mouth: + PND. Teeth present, mucosa erythematous and moist, no exudate noted, no lesions or ulcerations noted. No adenopathy noted. Cardiovascular: Normal rate and rhythm. S1,S2 noted.  No murmur, rubs or gallops noted.  Pulmonary/Chest: Normal effort and positive vesicular breath sounds. No respiratory distress. No wheezes, rales or ronchi noted.  Assessment & Plan:   Laryngitis secondary to post nasal drip:  Get some rest and drink plenty of water Do salt water gargles for the sore throat Try flonase 1 spray BID x 3 days Watch for fever, colored mucous or facial pressure  RTC as needed or if symptoms persist.

## 2014-11-18 NOTE — Progress Notes (Signed)
Pre visit review using our clinic review tool, if applicable. No additional management support is needed unless otherwise documented below in the visit note. 

## 2014-11-26 ENCOUNTER — Ambulatory Visit (INDEPENDENT_AMBULATORY_CARE_PROVIDER_SITE_OTHER): Payer: 59 | Admitting: *Deleted

## 2014-11-26 DIAGNOSIS — D519 Vitamin B12 deficiency anemia, unspecified: Secondary | ICD-10-CM

## 2014-11-26 MED ORDER — CYANOCOBALAMIN 1000 MCG/ML IJ SOLN
1000.0000 ug | Freq: Once | INTRAMUSCULAR | Status: AC
  Administered 2014-11-26: 1000 ug via INTRAMUSCULAR

## 2014-12-14 ENCOUNTER — Telehealth: Payer: Self-pay

## 2014-12-14 ENCOUNTER — Other Ambulatory Visit: Payer: Self-pay

## 2014-12-14 NOTE — Telephone Encounter (Signed)
-----   Message from Maury Dus, RN sent at 07/01/2014  9:33 AM EDT ----- Regarding: Labs Pt needs labs and possible phlebotomy in Jan, order for labs in.

## 2014-12-14 NOTE — Telephone Encounter (Signed)
Pt aware.

## 2014-12-15 ENCOUNTER — Other Ambulatory Visit: Payer: Self-pay

## 2014-12-15 ENCOUNTER — Other Ambulatory Visit (INDEPENDENT_AMBULATORY_CARE_PROVIDER_SITE_OTHER): Payer: 59

## 2014-12-15 LAB — CBC WITH DIFFERENTIAL/PLATELET
Basophils Absolute: 0 10*3/uL (ref 0.0–0.1)
Basophils Relative: 0.5 % (ref 0.0–3.0)
Eosinophils Absolute: 0.2 10*3/uL (ref 0.0–0.7)
Eosinophils Relative: 3.7 % (ref 0.0–5.0)
HCT: 44.7 % (ref 39.0–52.0)
Hemoglobin: 15.2 g/dL (ref 13.0–17.0)
LYMPHS PCT: 21.2 % (ref 12.0–46.0)
Lymphs Abs: 1.3 10*3/uL (ref 0.7–4.0)
MCHC: 34.1 g/dL (ref 30.0–36.0)
MCV: 91.5 fl (ref 78.0–100.0)
Monocytes Absolute: 0.5 10*3/uL (ref 0.1–1.0)
Monocytes Relative: 7.8 % (ref 3.0–12.0)
NEUTROS PCT: 66.8 % (ref 43.0–77.0)
Neutro Abs: 4 10*3/uL (ref 1.4–7.7)
Platelets: 191 10*3/uL (ref 150.0–400.0)
RBC: 4.89 Mil/uL (ref 4.22–5.81)
RDW: 14.2 % (ref 11.5–15.5)
WBC: 6 10*3/uL (ref 4.0–10.5)

## 2014-12-15 LAB — HEPATIC FUNCTION PANEL
ALBUMIN: 4.4 g/dL (ref 3.5–5.2)
ALT: 24 U/L (ref 0–53)
AST: 31 U/L (ref 0–37)
Alkaline Phosphatase: 115 U/L (ref 39–117)
BILIRUBIN TOTAL: 0.8 mg/dL (ref 0.2–1.2)
Bilirubin, Direct: 0.1 mg/dL (ref 0.0–0.3)
TOTAL PROTEIN: 7.5 g/dL (ref 6.0–8.3)

## 2014-12-15 LAB — FERRITIN: Ferritin: 21.1 ng/mL — ABNORMAL LOW (ref 22.0–322.0)

## 2014-12-28 ENCOUNTER — Other Ambulatory Visit (INDEPENDENT_AMBULATORY_CARE_PROVIDER_SITE_OTHER): Payer: 59

## 2014-12-28 ENCOUNTER — Ambulatory Visit (INDEPENDENT_AMBULATORY_CARE_PROVIDER_SITE_OTHER): Payer: 59

## 2014-12-28 DIAGNOSIS — E538 Deficiency of other specified B group vitamins: Secondary | ICD-10-CM

## 2014-12-28 LAB — CBC WITH DIFFERENTIAL/PLATELET
Basophils Absolute: 0 10*3/uL (ref 0.0–0.1)
Basophils Relative: 0.5 % (ref 0.0–3.0)
Eosinophils Absolute: 0.2 10*3/uL (ref 0.0–0.7)
Eosinophils Relative: 3.3 % (ref 0.0–5.0)
HCT: 45 % (ref 39.0–52.0)
Hemoglobin: 15.4 g/dL (ref 13.0–17.0)
Lymphocytes Relative: 22.9 % (ref 12.0–46.0)
Lymphs Abs: 1.6 10*3/uL (ref 0.7–4.0)
MCHC: 34.2 g/dL (ref 30.0–36.0)
MCV: 91.2 fl (ref 78.0–100.0)
Monocytes Absolute: 0.6 10*3/uL (ref 0.1–1.0)
Monocytes Relative: 8.5 % (ref 3.0–12.0)
Neutro Abs: 4.5 10*3/uL (ref 1.4–7.7)
Neutrophils Relative %: 64.8 % (ref 43.0–77.0)
Platelets: 194 10*3/uL (ref 150.0–400.0)
RBC: 4.94 Mil/uL (ref 4.22–5.81)
RDW: 14.6 % (ref 11.5–15.5)
WBC: 6.9 10*3/uL (ref 4.0–10.5)

## 2014-12-28 LAB — HEPATIC FUNCTION PANEL
ALK PHOS: 106 U/L (ref 39–117)
ALT: 27 U/L (ref 0–53)
AST: 32 U/L (ref 0–37)
Albumin: 4.4 g/dL (ref 3.5–5.2)
Bilirubin, Direct: 0.1 mg/dL (ref 0.0–0.3)
Total Bilirubin: 0.5 mg/dL (ref 0.2–1.2)
Total Protein: 7.1 g/dL (ref 6.0–8.3)

## 2014-12-28 LAB — FERRITIN: Ferritin: 22.8 ng/mL (ref 22.0–322.0)

## 2014-12-28 MED ORDER — CYANOCOBALAMIN 1000 MCG/ML IJ SOLN
1000.0000 ug | Freq: Once | INTRAMUSCULAR | Status: AC
  Administered 2014-12-28: 1000 ug via INTRAMUSCULAR

## 2015-02-01 ENCOUNTER — Ambulatory Visit (INDEPENDENT_AMBULATORY_CARE_PROVIDER_SITE_OTHER): Payer: 59

## 2015-02-01 DIAGNOSIS — E538 Deficiency of other specified B group vitamins: Secondary | ICD-10-CM

## 2015-02-01 MED ORDER — CYANOCOBALAMIN 1000 MCG/ML IJ SOLN
1000.0000 ug | Freq: Once | INTRAMUSCULAR | Status: AC
  Administered 2015-02-01: 1000 ug via INTRAMUSCULAR

## 2015-03-08 ENCOUNTER — Ambulatory Visit (INDEPENDENT_AMBULATORY_CARE_PROVIDER_SITE_OTHER): Payer: 59

## 2015-03-08 DIAGNOSIS — E538 Deficiency of other specified B group vitamins: Secondary | ICD-10-CM | POA: Diagnosis not present

## 2015-03-08 MED ORDER — CYANOCOBALAMIN 1000 MCG/ML IJ SOLN
1000.0000 ug | Freq: Once | INTRAMUSCULAR | Status: AC
  Administered 2015-03-08: 1000 ug via INTRAMUSCULAR

## 2015-03-13 ENCOUNTER — Other Ambulatory Visit: Payer: Self-pay | Admitting: Family Medicine

## 2015-03-13 DIAGNOSIS — I10 Essential (primary) hypertension: Secondary | ICD-10-CM

## 2015-03-13 DIAGNOSIS — E538 Deficiency of other specified B group vitamins: Secondary | ICD-10-CM

## 2015-03-14 ENCOUNTER — Other Ambulatory Visit: Payer: Self-pay | Admitting: Family Medicine

## 2015-03-14 ENCOUNTER — Other Ambulatory Visit (INDEPENDENT_AMBULATORY_CARE_PROVIDER_SITE_OTHER): Payer: 59

## 2015-03-14 DIAGNOSIS — E538 Deficiency of other specified B group vitamins: Secondary | ICD-10-CM | POA: Diagnosis not present

## 2015-03-14 DIAGNOSIS — R739 Hyperglycemia, unspecified: Secondary | ICD-10-CM

## 2015-03-14 DIAGNOSIS — I10 Essential (primary) hypertension: Secondary | ICD-10-CM | POA: Diagnosis not present

## 2015-03-14 LAB — COMPREHENSIVE METABOLIC PANEL
ALBUMIN: 4.3 g/dL (ref 3.5–5.2)
ALT: 25 U/L (ref 0–53)
AST: 25 U/L (ref 0–37)
Alkaline Phosphatase: 102 U/L (ref 39–117)
BUN: 22 mg/dL (ref 6–23)
CALCIUM: 9.6 mg/dL (ref 8.4–10.5)
CHLORIDE: 102 meq/L (ref 96–112)
CO2: 29 meq/L (ref 19–32)
CREATININE: 1.27 mg/dL (ref 0.40–1.50)
GFR: 61.38 mL/min (ref >60.00)
Glucose, Bld: 166 mg/dL — ABNORMAL HIGH (ref 70–99)
Potassium: 4.8 mEq/L (ref 3.5–5.1)
Sodium: 137 mEq/L (ref 135–145)
Total Bilirubin: 0.8 mg/dL (ref 0.2–1.2)
Total Protein: 7 g/dL (ref 6.0–8.3)

## 2015-03-14 LAB — CBC WITH DIFFERENTIAL/PLATELET
Basophils Absolute: 0 10*3/uL (ref 0.0–0.1)
Basophils Relative: 0.6 % (ref 0.0–3.0)
EOS PCT: 3.9 % (ref 0.0–5.0)
Eosinophils Absolute: 0.2 10*3/uL (ref 0.0–0.7)
HCT: 43.7 % (ref 39.0–52.0)
HEMOGLOBIN: 15.3 g/dL (ref 13.0–17.0)
LYMPHS ABS: 1.3 10*3/uL (ref 0.7–4.0)
Lymphocytes Relative: 27.8 % (ref 12.0–46.0)
MCHC: 35 g/dL (ref 30.0–36.0)
MCV: 92.7 fl (ref 78.0–100.0)
MONO ABS: 0.4 10*3/uL (ref 0.1–1.0)
Monocytes Relative: 9.7 % (ref 3.0–12.0)
Neutro Abs: 2.7 10*3/uL (ref 1.4–7.7)
Neutrophils Relative %: 58 % (ref 43.0–77.0)
Platelets: 169 10*3/uL (ref 150.0–400.0)
RBC: 4.71 Mil/uL (ref 4.22–5.81)
RDW: 12.9 % (ref 11.5–15.5)
WBC: 4.6 10*3/uL (ref 4.0–10.5)

## 2015-03-14 LAB — FERRITIN: Ferritin: 16 ng/mL — ABNORMAL LOW (ref 22.0–322.0)

## 2015-03-14 LAB — LIPID PANEL
CHOLESTEROL: 162 mg/dL (ref 0–200)
HDL: 31.3 mg/dL — ABNORMAL LOW (ref >39.00)
LDL Cholesterol: 97 mg/dL (ref 0–99)
NonHDL: 130.7
Total CHOL/HDL Ratio: 5
Triglycerides: 170 mg/dL — ABNORMAL HIGH (ref 0.0–149.0)
VLDL: 34 mg/dL (ref 0.0–40.0)

## 2015-03-14 LAB — VITAMIN B12: VITAMIN B 12: 697 pg/mL (ref 211–911)

## 2015-03-15 ENCOUNTER — Encounter: Payer: Self-pay | Admitting: Family Medicine

## 2015-03-15 ENCOUNTER — Other Ambulatory Visit (INDEPENDENT_AMBULATORY_CARE_PROVIDER_SITE_OTHER): Payer: 59

## 2015-03-15 DIAGNOSIS — R739 Hyperglycemia, unspecified: Secondary | ICD-10-CM

## 2015-03-15 LAB — HEMOGLOBIN A1C: Hgb A1c MFr Bld: 7.6 % — ABNORMAL HIGH (ref 4.6–6.5)

## 2015-03-21 ENCOUNTER — Encounter: Payer: Self-pay | Admitting: Family Medicine

## 2015-03-21 ENCOUNTER — Ambulatory Visit (INDEPENDENT_AMBULATORY_CARE_PROVIDER_SITE_OTHER): Payer: 59 | Admitting: Family Medicine

## 2015-03-21 ENCOUNTER — Other Ambulatory Visit: Payer: Self-pay | Admitting: Family Medicine

## 2015-03-21 VITALS — BP 122/72 | HR 60 | Temp 97.7°F | Ht 68.0 in | Wt 174.5 lb

## 2015-03-21 DIAGNOSIS — Z7189 Other specified counseling: Secondary | ICD-10-CM

## 2015-03-21 DIAGNOSIS — E119 Type 2 diabetes mellitus without complications: Secondary | ICD-10-CM

## 2015-03-21 DIAGNOSIS — N200 Calculus of kidney: Secondary | ICD-10-CM

## 2015-03-21 DIAGNOSIS — E785 Hyperlipidemia, unspecified: Secondary | ICD-10-CM

## 2015-03-21 DIAGNOSIS — I10 Essential (primary) hypertension: Secondary | ICD-10-CM

## 2015-03-21 DIAGNOSIS — E0500 Thyrotoxicosis with diffuse goiter without thyrotoxic crisis or storm: Secondary | ICD-10-CM

## 2015-03-21 DIAGNOSIS — Z0000 Encounter for general adult medical examination without abnormal findings: Secondary | ICD-10-CM

## 2015-03-21 MED ORDER — ALBUTEROL SULFATE HFA 108 (90 BASE) MCG/ACT IN AERS: 1.0000 | INHALATION_SPRAY | Freq: Four times a day (QID) | RESPIRATORY_TRACT | Status: DC | PRN

## 2015-03-21 MED ORDER — OXYCODONE-ACETAMINOPHEN 5-325 MG PO TABS: 2.0000 | ORAL_TABLET | Freq: Four times a day (QID) | ORAL | Status: DC | PRN

## 2015-03-21 MED ORDER — FENOFIBRATE 160 MG PO TABS: 160.0000 mg | ORAL_TABLET | Freq: Every day | ORAL | Status: DC

## 2015-03-21 MED ORDER — LISINOPRIL 20 MG PO TABS: 20.0000 mg | ORAL_TABLET | Freq: Every day | ORAL | Status: DC

## 2015-03-21 MED ORDER — LEVOTHYROXINE SODIUM 175 MCG PO TABS: 175.0000 ug | ORAL_TABLET | Freq: Every day | ORAL | Status: DC

## 2015-03-21 MED ORDER — OMEPRAZOLE 40 MG PO CPDR: 40.0000 mg | DELAYED_RELEASE_CAPSULE | Freq: Every day | ORAL | Status: DC

## 2015-03-21 NOTE — Patient Instructions (Addendum)
A little more exercise, cut out the white bread.  Recheck A1c in about 3 months.  We can decide about a follow up appointment at that point, when I see the labs.   Please check to see that you have a follow up with Dr. Henrene Pastor in ~05/2015.  Take care.  Glad to see you.

## 2015-03-21 NOTE — Progress Notes (Signed)
Pre visit review using our clinic review tool, if applicable. No additional management support is needed unless otherwise documented below in the visit note.  CPE- See plan.  Routine anticipatory guidance given to patient.  See health maintenance. Tetanus 2009  Flu done at work fall 2015 Shingles shot prev done.  PNA shot d/w pt. Doesn't appear to be due until age 59 except for his sugar situation, d/w pt.   Colonoscopy 2013  Prostate cancer screening and PSA options (with potential risks and benefits of testing vs not testing) were discussed along with recent recs/guidelines. He declined testing PSA at this point. Living will d/w pt. Would have his wife designated if pt were incapacitated.  Diet and exercise d/w pt. He's working on both, esp diet with hemochromatosis.   Hemochromatosis.  Ferritin still low.  I sent a note to GI asking about his follow up and patient will call about follow up.    Hypertension:    Using medication without problems or lightheadedness: yes Chest pain with exertion: no Edema: no Short of breath: no  Elevated Cholesterol: Using medications without problems:yes Muscle aches: no Diet compliance:yes Exercise:yes, walking some.  Encouraged more.    He needed refill on percocet for renal stones.  No recent use.  Old rx out of date. Old pills disposed of in approved container at clinic.  Witnessed by LF.    Sugar up.  D/w pt.  Dx'd DM2 based on A1c today.  Had been eating some white bread daily.   PMH and SH reviewed  Meds, vitals, and allergies reviewed.   ROS: See HPI.  Otherwise negative.    GEN: nad, alert and oriented HEENT: mucous membranes moist NECK: supple w/o LA CV: rrr. PULM: ctab, no inc wob ABD: soft, +bs EXT: no edema

## 2015-03-22 DIAGNOSIS — Z7189 Other specified counseling: Secondary | ICD-10-CM | POA: Insufficient documentation

## 2015-03-22 NOTE — Assessment & Plan Note (Signed)
Routine anticipatory guidance given to patient.  See health maintenance. Tetanus 2009  Flu done at work fall 2015 Shingles shot prev done.  PNA shot d/w pt. Doesn't appear to be due until age 59 except for his sugar situation, d/w pt.   Colonoscopy 2013  Prostate cancer screening and PSA options (with potential risks and benefits of testing vs not testing) were discussed along with recent recs/guidelines. He declined testing PSA at this point. Living will d/w pt. Would have his wife designated if pt were incapacitated.  Diet and exercise d/w pt. He's working on both, esp diet with hemochromatosis.

## 2015-03-22 NOTE — Assessment & Plan Note (Signed)
Continue as is, he has seen endo clinic.

## 2015-03-22 NOTE — Assessment & Plan Note (Signed)
Living will d/w pt. Would have his wife designated if pt were incapacitated. 

## 2015-03-22 NOTE — Assessment & Plan Note (Signed)
Controlled, continue as is.  Labs d/w pt.  

## 2015-03-22 NOTE — Assessment & Plan Note (Signed)
rx written for prn oxycodone, in case he has another flare.

## 2015-03-22 NOTE — Assessment & Plan Note (Signed)
New dx.  Recheck in 3 months, cut out white bread in the meantime.  This is a recent development based on A1c and I think he'll be able to get this controlled with diet and exercise. D/w pt.  He agrees.

## 2015-03-22 NOTE — Assessment & Plan Note (Signed)
Ferritin still low. He'll call about f/u with GI.

## 2015-03-22 NOTE — Assessment & Plan Note (Signed)
LDL okay for now, with TG elevation noted.  Continue as is with med, he'll work on diet, see DM2 dx.

## 2015-03-29 ENCOUNTER — Telehealth: Payer: Self-pay

## 2015-03-29 MED ORDER — LEVOTHYROXINE SODIUM 175 MCG PO TABS: 175.0000 ug | ORAL_TABLET | Freq: Every day | ORAL | Status: DC

## 2015-03-29 NOTE — Telephone Encounter (Signed)
Mrs Carmean said that Dr Wilson Singer wanted pt to take name brand Synthroid only; no refill of name brand needs to be sent to pharmacy because Dr  Wilson Singer has already done that. Mrs Azbill just wanted Dr Damita Dunnings to know as FYI. pts med list changed to name brand only.

## 2015-03-29 NOTE — Telephone Encounter (Signed)
Noted, thanks!

## 2015-04-08 ENCOUNTER — Ambulatory Visit (INDEPENDENT_AMBULATORY_CARE_PROVIDER_SITE_OTHER): Payer: 59 | Admitting: *Deleted

## 2015-04-08 DIAGNOSIS — E538 Deficiency of other specified B group vitamins: Secondary | ICD-10-CM

## 2015-04-08 MED ORDER — CYANOCOBALAMIN 1000 MCG/ML IJ SOLN
1000.0000 ug | Freq: Once | INTRAMUSCULAR | Status: AC
  Administered 2015-04-08: 1000 ug via INTRAMUSCULAR

## 2015-05-10 ENCOUNTER — Ambulatory Visit (INDEPENDENT_AMBULATORY_CARE_PROVIDER_SITE_OTHER): Payer: 59 | Admitting: *Deleted

## 2015-05-10 DIAGNOSIS — E538 Deficiency of other specified B group vitamins: Secondary | ICD-10-CM

## 2015-05-10 MED ORDER — CYANOCOBALAMIN 1000 MCG/ML IJ SOLN
1000.0000 ug | Freq: Once | INTRAMUSCULAR | Status: AC
  Administered 2015-05-10: 1000 ug via INTRAMUSCULAR

## 2015-05-27 ENCOUNTER — Telehealth: Payer: Self-pay | Admitting: Internal Medicine

## 2015-05-27 NOTE — Telephone Encounter (Signed)
Unable to reach patient. No mailbox set up. Will try again later.

## 2015-05-30 NOTE — Telephone Encounter (Signed)
Patient had CBC and ferritin done for Dr. Phillip Heal in April. He is asking when he needs to do labs again and have possible phlebotomy. Please, advise.

## 2015-05-31 ENCOUNTER — Other Ambulatory Visit: Payer: Self-pay | Admitting: *Deleted

## 2015-05-31 NOTE — Telephone Encounter (Signed)
Patient notified and labs in [place].

## 2015-05-31 NOTE — Telephone Encounter (Signed)
His ferritin is low (which is good). Thus, he does not need phlebotomy. Recommend repeating CBC and ferritin level in 1 year

## 2015-06-09 ENCOUNTER — Ambulatory Visit (INDEPENDENT_AMBULATORY_CARE_PROVIDER_SITE_OTHER): Payer: 59 | Admitting: *Deleted

## 2015-06-09 DIAGNOSIS — E538 Deficiency of other specified B group vitamins: Secondary | ICD-10-CM | POA: Diagnosis not present

## 2015-06-09 MED ORDER — CYANOCOBALAMIN 1000 MCG/ML IJ SOLN
1000.0000 ug | Freq: Once | INTRAMUSCULAR | Status: AC
  Administered 2015-06-09: 1000 ug via INTRAMUSCULAR

## 2015-06-20 ENCOUNTER — Other Ambulatory Visit (INDEPENDENT_AMBULATORY_CARE_PROVIDER_SITE_OTHER): Payer: 59

## 2015-06-20 ENCOUNTER — Other Ambulatory Visit: Payer: Self-pay | Admitting: Family Medicine

## 2015-06-20 DIAGNOSIS — E119 Type 2 diabetes mellitus without complications: Secondary | ICD-10-CM

## 2015-06-20 LAB — HEMOGLOBIN A1C: HEMOGLOBIN A1C: 6.8 % — AB (ref 4.6–6.5)

## 2015-06-22 ENCOUNTER — Telehealth: Payer: Self-pay

## 2015-06-22 NOTE — Telephone Encounter (Signed)
Pt aware.

## 2015-06-22 NOTE — Telephone Encounter (Signed)
-----   Message from Maury Dus, RN sent at 12/15/2014  4:50 PM EST ----- Regarding: Labs Pt needs CBC and Ferritin in July, orders in epic.

## 2015-06-24 ENCOUNTER — Other Ambulatory Visit: Payer: Self-pay

## 2015-06-24 ENCOUNTER — Other Ambulatory Visit (INDEPENDENT_AMBULATORY_CARE_PROVIDER_SITE_OTHER): Payer: 59

## 2015-06-24 LAB — CBC WITH DIFFERENTIAL/PLATELET
BASOS ABS: 0 10*3/uL (ref 0.0–0.1)
Basophils Relative: 0.4 % (ref 0.0–3.0)
EOS ABS: 0.2 10*3/uL (ref 0.0–0.7)
EOS PCT: 3.7 % (ref 0.0–5.0)
HCT: 41.6 % (ref 39.0–52.0)
HEMOGLOBIN: 14.4 g/dL (ref 13.0–17.0)
LYMPHS ABS: 1.4 10*3/uL (ref 0.7–4.0)
Lymphocytes Relative: 28 % (ref 12.0–46.0)
MCHC: 34.7 g/dL (ref 30.0–36.0)
MCV: 93.7 fl (ref 78.0–100.0)
Monocytes Absolute: 0.5 10*3/uL (ref 0.1–1.0)
Monocytes Relative: 9.3 % (ref 3.0–12.0)
Neutro Abs: 2.9 10*3/uL (ref 1.4–7.7)
Neutrophils Relative %: 58.6 % (ref 43.0–77.0)
PLATELETS: 170 10*3/uL (ref 150.0–400.0)
RBC: 4.44 Mil/uL (ref 4.22–5.81)
RDW: 12.9 % (ref 11.5–15.5)
WBC: 5 10*3/uL (ref 4.0–10.5)

## 2015-06-24 LAB — FERRITIN: Ferritin: 17.3 ng/mL — ABNORMAL LOW (ref 22.0–322.0)

## 2015-07-11 LAB — HM DIABETES EYE EXAM

## 2015-07-12 ENCOUNTER — Ambulatory Visit (INDEPENDENT_AMBULATORY_CARE_PROVIDER_SITE_OTHER): Payer: 59

## 2015-07-12 DIAGNOSIS — E538 Deficiency of other specified B group vitamins: Secondary | ICD-10-CM

## 2015-07-12 MED ORDER — CYANOCOBALAMIN 1000 MCG/ML IJ SOLN
1000.0000 ug | Freq: Once | INTRAMUSCULAR | Status: AC
  Administered 2015-07-12: 1000 ug via INTRAMUSCULAR

## 2015-08-16 ENCOUNTER — Ambulatory Visit (INDEPENDENT_AMBULATORY_CARE_PROVIDER_SITE_OTHER): Payer: 59

## 2015-08-16 DIAGNOSIS — E538 Deficiency of other specified B group vitamins: Secondary | ICD-10-CM | POA: Diagnosis not present

## 2015-08-16 MED ORDER — CYANOCOBALAMIN 1000 MCG/ML IJ SOLN
1000.0000 ug | Freq: Once | INTRAMUSCULAR | Status: AC
  Administered 2015-08-16: 1000 ug via INTRAMUSCULAR

## 2015-09-20 ENCOUNTER — Ambulatory Visit (INDEPENDENT_AMBULATORY_CARE_PROVIDER_SITE_OTHER): Payer: 59

## 2015-09-20 DIAGNOSIS — E538 Deficiency of other specified B group vitamins: Secondary | ICD-10-CM | POA: Diagnosis not present

## 2015-09-20 MED ORDER — CYANOCOBALAMIN 1000 MCG/ML IJ SOLN
1000.0000 ug | Freq: Once | INTRAMUSCULAR | Status: AC
  Administered 2015-09-20: 1000 ug via INTRAMUSCULAR

## 2015-10-24 ENCOUNTER — Other Ambulatory Visit: Payer: 59

## 2015-10-25 ENCOUNTER — Other Ambulatory Visit (INDEPENDENT_AMBULATORY_CARE_PROVIDER_SITE_OTHER): Payer: 59

## 2015-10-25 ENCOUNTER — Ambulatory Visit (INDEPENDENT_AMBULATORY_CARE_PROVIDER_SITE_OTHER): Payer: 59 | Admitting: *Deleted

## 2015-10-25 DIAGNOSIS — E119 Type 2 diabetes mellitus without complications: Secondary | ICD-10-CM | POA: Diagnosis not present

## 2015-10-25 DIAGNOSIS — E538 Deficiency of other specified B group vitamins: Secondary | ICD-10-CM | POA: Diagnosis not present

## 2015-10-25 LAB — HEMOGLOBIN A1C: Hgb A1c MFr Bld: 8 % — ABNORMAL HIGH (ref 4.6–6.5)

## 2015-10-25 MED ORDER — CYANOCOBALAMIN 1000 MCG/ML IJ SOLN
1000.0000 ug | Freq: Once | INTRAMUSCULAR | Status: AC
  Administered 2015-10-25: 1000 ug via INTRAMUSCULAR

## 2015-11-02 ENCOUNTER — Encounter: Payer: Self-pay | Admitting: Family Medicine

## 2015-11-02 ENCOUNTER — Ambulatory Visit (INDEPENDENT_AMBULATORY_CARE_PROVIDER_SITE_OTHER): Payer: 59 | Admitting: Family Medicine

## 2015-11-02 VITALS — BP 100/70 | HR 64 | Temp 97.5°F | Wt 167.2 lb

## 2015-11-02 DIAGNOSIS — E119 Type 2 diabetes mellitus without complications: Secondary | ICD-10-CM | POA: Diagnosis not present

## 2015-11-02 MED ORDER — METFORMIN HCL 500 MG PO TABS: 500.0000 mg | ORAL_TABLET | Freq: Every day | ORAL | Status: DC

## 2015-11-02 NOTE — Progress Notes (Signed)
Pre visit review using our clinic review tool, if applicable. No additional management support is needed unless otherwise documented below in the visit note.  DM2.  A1c up, d/w pt.  Diet complicated by hemachromatosis concerns/limitations.  He is working on diet, cut out breads already.  Still with some carbs in diet.   Eye exam 8/16, no retinopathy per patient.  Flu shot 09/20/15 per report.   Meds, vitals, and allergies reviewed.   ROS: See HPI.  Otherwise, noncontributory.  GEN: nad, alert and oriented HEENT: mucous membranes moist NECK: supple w/o LA CV: rrr.  PULM: ctab, no inc wob ABD: soft, +bs EXT: no edema

## 2015-11-02 NOTE — Assessment & Plan Note (Signed)
At this point, with dietary restriction from hemochromatosis, likely better to continue on low iron diet and add on metformin.   He agrees.  rx done for meter and lancets and strips.  He can start metformin 500mg  a day, check sugars and then update me as needed.  Recheck in about 3 months, A1c.  He agrees.  Okay for outpatient f/u.

## 2015-11-02 NOTE — Patient Instructions (Addendum)
Check your sugar at variable times- fasting, 2 hours after meals.  Recheck A1c in about 3 months.   Goal AM sugar ~120s.  Start metformin once a day.  Update me as needed.  Take care.  Glad to see you.

## 2015-11-29 ENCOUNTER — Ambulatory Visit (INDEPENDENT_AMBULATORY_CARE_PROVIDER_SITE_OTHER): Payer: 59

## 2015-11-29 DIAGNOSIS — E538 Deficiency of other specified B group vitamins: Secondary | ICD-10-CM | POA: Diagnosis not present

## 2015-11-29 MED ORDER — CYANOCOBALAMIN 1000 MCG/ML IJ SOLN
1000.0000 ug | Freq: Once | INTRAMUSCULAR | Status: AC
  Administered 2015-11-29: 1000 ug via INTRAMUSCULAR

## 2015-12-22 ENCOUNTER — Telehealth: Payer: Self-pay | Admitting: Family Medicine

## 2015-12-22 NOTE — Telephone Encounter (Signed)
Left detailed message on voicemail.  

## 2015-12-22 NOTE — Telephone Encounter (Signed)
Patient says he sent them in thru [facility] last week and it appeared to transmit appropriately but if not, let him know and he will get [place] another copy.

## 2015-12-22 NOTE — Telephone Encounter (Signed)
I don't recall seeing them prev and I don't see them now.  Can he please resend? Thanks.

## 2015-12-22 NOTE — Telephone Encounter (Signed)
Please check on patient.   I thought he was going to send in some sugar readings, but I don't recall seeing them.  Please let me know.  Thanks.

## 2015-12-27 ENCOUNTER — Other Ambulatory Visit: Payer: Self-pay | Admitting: Family Medicine

## 2015-12-27 MED ORDER — METFORMIN HCL 500 MG PO TABS
500.0000 mg | ORAL_TABLET | Freq: Two times a day (BID) | ORAL | Status: DC
Start: 2015-12-27 — End: 2016-01-12

## 2015-12-27 NOTE — Progress Notes (Signed)
Patient advised.

## 2015-12-27 NOTE — Progress Notes (Signed)
Please call pt.  Thanks for the sugar report.  I would try going up to BID on the metformin to see if he can tolerate it.  See how his sugars run and let me know in about 2 weeks.  Thanks.

## 2016-01-03 ENCOUNTER — Ambulatory Visit (INDEPENDENT_AMBULATORY_CARE_PROVIDER_SITE_OTHER): Payer: 59 | Admitting: *Deleted

## 2016-01-03 DIAGNOSIS — E538 Deficiency of other specified B group vitamins: Secondary | ICD-10-CM | POA: Diagnosis not present

## 2016-01-03 MED ORDER — CYANOCOBALAMIN 1000 MCG/ML IJ SOLN
1000.0000 ug | Freq: Once | INTRAMUSCULAR | Status: AC
  Administered 2016-01-03: 1000 ug via INTRAMUSCULAR

## 2016-01-11 NOTE — Progress Notes (Signed)
Pt brought in list of blood sugars (that list in is Dr Josefine Class in box and will need to be sent for scanning after review please); if pt is to continue taking metformin 500 mg bid pt will need new rx sent to [facility] [address]. Pt request cb at [phone]. Pt will be out of metformin on 01/15/16.

## 2016-01-11 NOTE — Progress Notes (Signed)
I'll check the log. Thanks.

## 2016-01-12 ENCOUNTER — Encounter: Payer: Self-pay | Admitting: Family Medicine

## 2016-01-12 ENCOUNTER — Ambulatory Visit (INDEPENDENT_AMBULATORY_CARE_PROVIDER_SITE_OTHER): Payer: 59 | Admitting: Family Medicine

## 2016-01-12 VITALS — BP 102/58 | HR 77 | Temp 97.8°F | Wt 164.8 lb

## 2016-01-12 DIAGNOSIS — Z119 Encounter for screening for infectious and parasitic diseases, unspecified: Secondary | ICD-10-CM

## 2016-01-12 DIAGNOSIS — E119 Type 2 diabetes mellitus without complications: Secondary | ICD-10-CM | POA: Diagnosis not present

## 2016-01-12 DIAGNOSIS — E538 Deficiency of other specified B group vitamins: Secondary | ICD-10-CM | POA: Diagnosis not present

## 2016-01-12 DIAGNOSIS — H6122 Impacted cerumen, left ear: Secondary | ICD-10-CM

## 2016-01-12 MED ORDER — METFORMIN HCL 500 MG PO TABS
500.0000 mg | ORAL_TABLET | Freq: Two times a day (BID) | ORAL | Status: DC
Start: 2016-01-12 — End: 2016-11-28

## 2016-01-12 NOTE — Addendum Note (Signed)
Addended by: Tonia Ghent on: 01/12/2016 11:16 AM   Modules accepted: Orders

## 2016-01-12 NOTE — Progress Notes (Signed)
Patient advised.

## 2016-01-12 NOTE — Patient Instructions (Signed)
Take care.  Glad to see you. Update me as needed.  

## 2016-01-12 NOTE — Progress Notes (Addendum)
Those sugars are lower, look better.  Would continue as is and recheck A1c later on.  Thanks.  rx sent.

## 2016-01-12 NOTE — Progress Notes (Signed)
Pre visit review using our clinic review tool, if applicable. No additional management support is needed unless otherwise documented below in the visit note.  L ears sx.  Started about 4 days ago.  Can't hear well.  Tried OTC ear flush.  Some wax came out but then no more relief. Has to use ear plugs at work.  No FCNVAD.  No R ear sx.    DM2.  Sugars improved with metformin, no ADE.  D/w pt about health maintenance and we can address at CPE.  He agrees.  Not due for A1c now.    nad ncat L canal with ear wax noted, removed with curette and irrigation.  Recheck wnl.  Hearing normal on testing afterward.  No complication.  He feels back to baseline afterward.

## 2016-01-13 ENCOUNTER — Encounter: Payer: Self-pay | Admitting: Family Medicine

## 2016-01-13 DIAGNOSIS — H6120 Impacted cerumen, unspecified ear: Secondary | ICD-10-CM | POA: Insufficient documentation

## 2016-01-13 NOTE — Assessment & Plan Note (Signed)
Now resolved. No complications.

## 2016-01-13 NOTE — Assessment & Plan Note (Signed)
Improved based on home sugars, continue metformin as is.

## 2016-01-20 ENCOUNTER — Encounter: Payer: Self-pay | Admitting: Family Medicine

## 2016-01-20 ENCOUNTER — Ambulatory Visit (INDEPENDENT_AMBULATORY_CARE_PROVIDER_SITE_OTHER): Payer: 59 | Admitting: Family Medicine

## 2016-01-20 VITALS — BP 124/68 | HR 97 | Temp 99.3°F | Wt 165.5 lb

## 2016-01-20 DIAGNOSIS — R509 Fever, unspecified: Secondary | ICD-10-CM | POA: Diagnosis not present

## 2016-01-20 DIAGNOSIS — J111 Influenza due to unidentified influenza virus with other respiratory manifestations: Secondary | ICD-10-CM

## 2016-01-20 LAB — POCT INFLUENZA A/B
INFLUENZA A, POC: POSITIVE — AB
Influenza B, POC: POSITIVE — AB

## 2016-01-20 MED ORDER — OSELTAMIVIR PHOSPHATE 75 MG PO CAPS: 75.0000 mg | ORAL_CAPSULE | Freq: Two times a day (BID) | ORAL | Status: DC

## 2016-01-20 MED ORDER — ONDANSETRON HCL 4 MG PO TABS: 4.0000 mg | ORAL_TABLET | Freq: Three times a day (TID) | ORAL | Status: DC | PRN

## 2016-01-20 MED ORDER — BENZONATATE 200 MG PO CAPS: 200.0000 mg | ORAL_CAPSULE | Freq: Three times a day (TID) | ORAL | Status: DC | PRN

## 2016-01-20 NOTE — Patient Instructions (Signed)
tamiflu twice a day.  Tessalon as needed for cough.  Rest and fluids.  zofran as needed for nausea.  Update [place] as needed.  Take care.  Glad to see you.

## 2016-01-20 NOTE — Progress Notes (Signed)
Pre visit review using our clinic review tool, if applicable. No additional management support is needed unless otherwise documented below in the visit note.  Sx started quickly about 2 days ago.  Woke up with chest congestion and cough. SABA isn't helping the cough.  Tried flonase, that helped head congestion some.  Fevers likely, tmax in the 99s.  Inc temp at night. Diffuse aches.  Some sputum, yellowish.  No ear pain.  No ST.  Some wheeze.  No pain with a deep breath, but pain with a cough.   Meds, vitals, and allergies reviewed.   ROS: See HPI.  Otherwise, noncontributory.  GEN: nad, alert and oriented HEENT: mucous membranes moist, tm w/o erythema, nasal exam w/o erythema, clear discharge noted,  OP with cobblestoning NECK: supple w/o LA CV: rrr.   PULM: ctab, no inc wob EXT: no edema SKIN: no acute rash

## 2016-01-22 DIAGNOSIS — J111 Influenza due to unidentified influenza virus with other respiratory manifestations: Secondary | ICD-10-CM | POA: Insufficient documentation

## 2016-01-22 NOTE — Assessment & Plan Note (Signed)
Flu pos, start tamiflu.  Nontoxic.  Supportive care o/w.  Okay for outpatient f/u.  See AVS.  He agrees. Update me as needed.

## 2016-02-02 ENCOUNTER — Other Ambulatory Visit: Payer: 59

## 2016-02-07 ENCOUNTER — Ambulatory Visit (INDEPENDENT_AMBULATORY_CARE_PROVIDER_SITE_OTHER): Payer: 59

## 2016-02-07 ENCOUNTER — Other Ambulatory Visit (INDEPENDENT_AMBULATORY_CARE_PROVIDER_SITE_OTHER): Payer: 59

## 2016-02-07 DIAGNOSIS — E119 Type 2 diabetes mellitus without complications: Secondary | ICD-10-CM

## 2016-02-07 DIAGNOSIS — E538 Deficiency of other specified B group vitamins: Secondary | ICD-10-CM

## 2016-02-07 LAB — HEMOGLOBIN A1C: HEMOGLOBIN A1C: 7.4 % — AB (ref 4.6–6.5)

## 2016-02-07 MED ORDER — CYANOCOBALAMIN 1000 MCG/ML IJ SOLN
1000.0000 ug | Freq: Once | INTRAMUSCULAR | Status: AC
  Administered 2016-02-07: 1000 ug via INTRAMUSCULAR

## 2016-03-07 ENCOUNTER — Ambulatory Visit (INDEPENDENT_AMBULATORY_CARE_PROVIDER_SITE_OTHER): Payer: 59 | Admitting: *Deleted

## 2016-03-07 DIAGNOSIS — E538 Deficiency of other specified B group vitamins: Secondary | ICD-10-CM | POA: Diagnosis not present

## 2016-03-07 MED ORDER — CYANOCOBALAMIN 1000 MCG/ML IJ SOLN
1000.0000 ug | Freq: Once | INTRAMUSCULAR | Status: AC
  Administered 2016-03-07: 1000 ug via INTRAMUSCULAR

## 2016-03-19 ENCOUNTER — Other Ambulatory Visit (INDEPENDENT_AMBULATORY_CARE_PROVIDER_SITE_OTHER): Payer: 59

## 2016-03-19 DIAGNOSIS — E538 Deficiency of other specified B group vitamins: Secondary | ICD-10-CM

## 2016-03-19 DIAGNOSIS — E119 Type 2 diabetes mellitus without complications: Secondary | ICD-10-CM

## 2016-03-19 DIAGNOSIS — Z119 Encounter for screening for infectious and parasitic diseases, unspecified: Secondary | ICD-10-CM

## 2016-03-19 LAB — CBC WITH DIFFERENTIAL/PLATELET
BASOS ABS: 0 10*3/uL (ref 0.0–0.1)
Basophils Relative: 0.5 % (ref 0.0–3.0)
Eosinophils Absolute: 0.1 10*3/uL (ref 0.0–0.7)
Eosinophils Relative: 2.5 % (ref 0.0–5.0)
HCT: 43.2 % (ref 39.0–52.0)
Hemoglobin: 15.1 g/dL (ref 13.0–17.0)
LYMPHS ABS: 1.3 10*3/uL (ref 0.7–4.0)
Lymphocytes Relative: 27.1 % (ref 12.0–46.0)
MCHC: 35 g/dL (ref 30.0–36.0)
MCV: 94.5 fl (ref 78.0–100.0)
MONOS PCT: 11.2 % (ref 3.0–12.0)
Monocytes Absolute: 0.5 10*3/uL (ref 0.1–1.0)
NEUTROS PCT: 58.7 % (ref 43.0–77.0)
Neutro Abs: 2.7 10*3/uL (ref 1.4–7.7)
Platelets: 189 10*3/uL (ref 150.0–400.0)
RBC: 4.57 Mil/uL (ref 4.22–5.81)
RDW: 12.9 % (ref 11.5–15.5)
WBC: 4.6 10*3/uL (ref 4.0–10.5)

## 2016-03-19 LAB — COMPREHENSIVE METABOLIC PANEL
ALT: 24 U/L (ref 0–53)
AST: 31 U/L (ref 0–37)
Albumin: 4.5 g/dL (ref 3.5–5.2)
Alkaline Phosphatase: 103 U/L (ref 39–117)
BUN: 21 mg/dL (ref 6–23)
CALCIUM: 9.8 mg/dL (ref 8.4–10.5)
CO2: 29 meq/L (ref 19–32)
CREATININE: 1.16 mg/dL (ref 0.40–1.50)
Chloride: 102 mEq/L (ref 96–112)
GFR: 67.91 mL/min (ref >60.00)
Glucose, Bld: 133 mg/dL — ABNORMAL HIGH (ref 70–99)
Potassium: 4.6 mEq/L (ref 3.5–5.1)
Sodium: 139 mEq/L (ref 135–145)
TOTAL PROTEIN: 7.2 g/dL (ref 6.0–8.3)
Total Bilirubin: 0.7 mg/dL (ref 0.2–1.2)

## 2016-03-19 LAB — LIPID PANEL
CHOL/HDL RATIO: 4
CHOLESTEROL: 158 mg/dL (ref 0–200)
HDL: 41.1 mg/dL (ref >39.00)
LDL Cholesterol: 99 mg/dL (ref 0–99)
NonHDL: 116.47
TRIGLYCERIDES: 87 mg/dL (ref 0.0–149.0)
VLDL: 17.4 mg/dL (ref 0.0–40.0)

## 2016-03-19 LAB — FERRITIN: Ferritin: 22.9 ng/mL (ref 22.0–322.0)

## 2016-03-19 LAB — HEMOGLOBIN A1C: Hgb A1c MFr Bld: 6.8 % — ABNORMAL HIGH (ref 4.6–6.5)

## 2016-03-19 LAB — VITAMIN B12: VITAMIN B 12: 531 pg/mL (ref 211–911)

## 2016-03-19 LAB — HIV ANTIBODY (ROUTINE TESTING W REFLEX): HIV: NONREACTIVE

## 2016-03-19 LAB — TSH: TSH: 2.37 u[IU]/mL (ref 0.35–4.50)

## 2016-03-26 ENCOUNTER — Encounter: Payer: Self-pay | Admitting: Family Medicine

## 2016-03-26 ENCOUNTER — Ambulatory Visit (INDEPENDENT_AMBULATORY_CARE_PROVIDER_SITE_OTHER): Payer: 59 | Admitting: Family Medicine

## 2016-03-26 VITALS — BP 100/60 | HR 65 | Temp 97.7°F | Ht 65.0 in | Wt 164.2 lb

## 2016-03-26 DIAGNOSIS — E538 Deficiency of other specified B group vitamins: Secondary | ICD-10-CM

## 2016-03-26 DIAGNOSIS — I10 Essential (primary) hypertension: Secondary | ICD-10-CM

## 2016-03-26 DIAGNOSIS — Z23 Encounter for immunization: Secondary | ICD-10-CM | POA: Diagnosis not present

## 2016-03-26 DIAGNOSIS — Z0000 Encounter for general adult medical examination without abnormal findings: Secondary | ICD-10-CM | POA: Diagnosis not present

## 2016-03-26 DIAGNOSIS — Z125 Encounter for screening for malignant neoplasm of prostate: Secondary | ICD-10-CM

## 2016-03-26 DIAGNOSIS — E0500 Thyrotoxicosis with diffuse goiter without thyrotoxic crisis or storm: Secondary | ICD-10-CM

## 2016-03-26 DIAGNOSIS — E785 Hyperlipidemia, unspecified: Secondary | ICD-10-CM

## 2016-03-26 DIAGNOSIS — Z7189 Other specified counseling: Secondary | ICD-10-CM

## 2016-03-26 DIAGNOSIS — E119 Type 2 diabetes mellitus without complications: Secondary | ICD-10-CM

## 2016-03-26 NOTE — Patient Instructions (Addendum)
Recheck labs before a visit in about 6 months.  Take care.  Glad to see you.   Don't change your meds for now.   If you are getting lightheaded, then cut the lisinopril in half and update me.   Take care.  Glad to see you.

## 2016-03-26 NOTE — Progress Notes (Signed)
Pre visit review using our clinic review tool, if applicable. No additional management support is needed unless otherwise documented below in the visit note.  CPE- See plan.  Routine anticipatory guidance given to patient.  See health maintenance. Tetanus 2009  Flu done at work fall 2016 Shingles shot prev done.  PNA shot d/w pt. Colonoscopy 2013  Prostate cancer screening and PSA options (with potential risks and benefits of testing vs not testing) were discussed along with recent recs/guidelines. He elected for testing with next set of labs.  Living will d/w pt. Would have his wife designated if pt were incapacitated.  Diet and exercise d/w pt. He's working on both, esp diet with hemochromatosis.  Hx of hemochromatosis, LFTs wnl, working on diet.  Ferritin at goal.  Labs d/w pt.   Hypertension:    Using medication without problems or lightheadedness: yes Chest pain with exertion:no Edema:no Short of breath:no He has some occ brief L sided CP w/o exertion.  Last a few seconds.  Has been longstanding, episodically, going on for years, irregularly.  Unclear if GERD related.    Elevated Cholesterol: Using medications without problems:yes Muscle aches: no Diet compliance:yes Exercise:yes  B12 def, repleted on IM tx monthly, labs d/w pt.  Reasonable to continue.    Diabetes:  Using medications without difficulties:yes Hypoglycemic episodes:no Hyperglycemic episodes:no Feet problems:no Blood Sugars averaging:  130-140s eye exam within last year:yes  Hypothyroidism.  TSH wnl.  Weight stable recently but somewhat down from prev highs, with better diet noted.  He doesn't have alarming, "B" sx.  He feels well.    PMH and SH reviewed  Meds, vitals, and allergies reviewed.   ROS: See HPI.  Otherwise negative.    GEN: nad, alert and oriented HEENT: mucous membranes moist NECK: supple w/o LA, no tmg on exam CV: rrr. PULM: ctab, no inc wob ABD: soft, +bs EXT: no edema SKIN:  no acute rash  Diabetic foot exam: Normal inspection No skin breakdown No calluses  Normal DP pulses Normal sensation to light touch and monofilament Nails normal

## 2016-03-27 NOTE — Assessment & Plan Note (Signed)
Controlled continue as is.  D/w pt re: labs and plan.  He agrees.

## 2016-03-27 NOTE — Assessment & Plan Note (Signed)
Controlled continue as is.  D/w pt re: labs and plan.  He agrees. He can cut back med if lightheaded and update me. He has no exertional CP.  He may have occ GERD sx vs chest wall pain, he can monitor and update me as needed.

## 2016-03-27 NOTE — Assessment & Plan Note (Signed)
Tetanus 2009  Flu done at work fall 2016  Shingles shot prev done.  PNA shot d/w pt.  Colonoscopy 2013  Prostate cancer screening and PSA options (with potential risks and benefits of testing vs not testing) were discussed along with recent recs/guidelines. He elected for testing with next set of labs.  Living will d/w pt. Would have his wife designated if pt were incapacitated.  Diet and exercise d/w pt. He's working on both, esp diet with hemochromatosis.

## 2016-03-27 NOTE — Assessment & Plan Note (Signed)
TSH wnl. Controlled continue as is.  D/w pt re: labs and plan.  He agrees.

## 2016-03-27 NOTE — Assessment & Plan Note (Signed)
Controlled continue as is.  D/w pt re: labs and plan.  He agrees. Sugar improved on home checks with med as is.

## 2016-04-08 ENCOUNTER — Other Ambulatory Visit: Payer: Self-pay | Admitting: Family Medicine

## 2016-04-10 ENCOUNTER — Ambulatory Visit (INDEPENDENT_AMBULATORY_CARE_PROVIDER_SITE_OTHER): Payer: 59

## 2016-04-10 DIAGNOSIS — E538 Deficiency of other specified B group vitamins: Secondary | ICD-10-CM

## 2016-04-10 MED ORDER — CYANOCOBALAMIN 1000 MCG/ML IJ SOLN
1000.0000 ug | Freq: Once | INTRAMUSCULAR | Status: AC
  Administered 2016-04-10: 1000 ug via INTRAMUSCULAR

## 2016-05-05 ENCOUNTER — Other Ambulatory Visit: Payer: Self-pay | Admitting: Family Medicine

## 2016-05-15 ENCOUNTER — Ambulatory Visit (INDEPENDENT_AMBULATORY_CARE_PROVIDER_SITE_OTHER): Payer: 59 | Admitting: *Deleted

## 2016-05-15 DIAGNOSIS — E538 Deficiency of other specified B group vitamins: Secondary | ICD-10-CM

## 2016-05-15 MED ORDER — CYANOCOBALAMIN 1000 MCG/ML IJ SOLN
1000.0000 ug | Freq: Once | INTRAMUSCULAR | Status: AC
  Administered 2016-05-15: 1000 ug via INTRAMUSCULAR

## 2016-05-17 ENCOUNTER — Ambulatory Visit: Payer: 59

## 2016-05-24 ENCOUNTER — Telehealth: Payer: Self-pay

## 2016-05-24 NOTE — Telephone Encounter (Signed)
Pt aware.

## 2016-05-24 NOTE — Telephone Encounter (Signed)
-----   Message from Hulan Saas, RN sent at 05/31/2015 10:32 AM EDT ----- Patient needs CBC, ferritin for Ryan Crane 05/28/16. Labs in EPIC.

## 2016-05-29 ENCOUNTER — Other Ambulatory Visit: Payer: Self-pay

## 2016-05-29 ENCOUNTER — Other Ambulatory Visit (INDEPENDENT_AMBULATORY_CARE_PROVIDER_SITE_OTHER): Payer: 59

## 2016-05-29 LAB — CBC WITH DIFFERENTIAL/PLATELET
Basophils Absolute: 0 10*3/uL (ref 0.0–0.1)
Basophils Relative: 0.4 % (ref 0.0–3.0)
EOS PCT: 4 % (ref 0.0–5.0)
Eosinophils Absolute: 0.2 10*3/uL (ref 0.0–0.7)
HCT: 42.2 % (ref 39.0–52.0)
Hemoglobin: 14.5 g/dL (ref 13.0–17.0)
LYMPHS ABS: 1.5 10*3/uL (ref 0.7–4.0)
LYMPHS PCT: 27.9 % (ref 12.0–46.0)
MCHC: 34.5 g/dL (ref 30.0–36.0)
MCV: 93 fl (ref 78.0–100.0)
MONO ABS: 0.5 10*3/uL (ref 0.1–1.0)
Monocytes Relative: 9.6 % (ref 3.0–12.0)
NEUTROS PCT: 58.1 % (ref 43.0–77.0)
Neutro Abs: 3.1 10*3/uL (ref 1.4–7.7)
Platelets: 176 10*3/uL (ref 150.0–400.0)
RBC: 4.53 Mil/uL (ref 4.22–5.81)
RDW: 12.7 % (ref 11.5–15.5)
WBC: 5.4 10*3/uL (ref 4.0–10.5)

## 2016-05-29 LAB — FERRITIN: FERRITIN: 21.9 ng/mL — AB (ref 22.0–322.0)

## 2016-06-14 ENCOUNTER — Ambulatory Visit: Payer: 59

## 2016-06-19 ENCOUNTER — Ambulatory Visit (INDEPENDENT_AMBULATORY_CARE_PROVIDER_SITE_OTHER): Payer: 59

## 2016-06-19 DIAGNOSIS — E538 Deficiency of other specified B group vitamins: Secondary | ICD-10-CM | POA: Diagnosis not present

## 2016-06-19 MED ORDER — CYANOCOBALAMIN 1000 MCG/ML IJ SOLN
1000.0000 ug | Freq: Once | INTRAMUSCULAR | Status: AC
  Administered 2016-06-19: 1000 ug via INTRAMUSCULAR

## 2016-07-12 ENCOUNTER — Ambulatory Visit: Payer: 59

## 2016-07-24 ENCOUNTER — Ambulatory Visit (INDEPENDENT_AMBULATORY_CARE_PROVIDER_SITE_OTHER): Payer: 59 | Admitting: *Deleted

## 2016-07-24 DIAGNOSIS — E538 Deficiency of other specified B group vitamins: Secondary | ICD-10-CM | POA: Diagnosis not present

## 2016-07-24 MED ORDER — CYANOCOBALAMIN 1000 MCG/ML IJ SOLN
1000.0000 ug | Freq: Once | INTRAMUSCULAR | Status: AC
  Administered 2016-07-24: 1000 ug via INTRAMUSCULAR

## 2016-08-09 LAB — HM DIABETES EYE EXAM

## 2016-08-16 ENCOUNTER — Ambulatory Visit: Payer: 59

## 2016-08-27 ENCOUNTER — Other Ambulatory Visit: Payer: Self-pay

## 2016-08-27 MED ORDER — OXYCODONE-ACETAMINOPHEN 5-325 MG PO TABS: 2.0000 | ORAL_TABLET | Freq: Four times a day (QID) | ORAL | 0 refills | Status: DC | PRN

## 2016-08-27 NOTE — Telephone Encounter (Signed)
Printed.  Thanks.  

## 2016-08-27 NOTE — Telephone Encounter (Signed)
[facility] pts wife (DPR signed) left v/m requesting rx oxycodone apap. Call when ready for pick up. Last printed # 30 on 03/21/15. Last annual 03/26/16.

## 2016-08-27 NOTE — Telephone Encounter (Signed)
Patient advised.  Rx left at front desk for pick up. 

## 2016-08-28 ENCOUNTER — Ambulatory Visit (INDEPENDENT_AMBULATORY_CARE_PROVIDER_SITE_OTHER): Payer: 59

## 2016-08-28 DIAGNOSIS — E538 Deficiency of other specified B group vitamins: Secondary | ICD-10-CM | POA: Diagnosis not present

## 2016-08-28 MED ORDER — CYANOCOBALAMIN 1000 MCG/ML IJ SOLN
1000.0000 ug | Freq: Once | INTRAMUSCULAR | Status: AC
  Administered 2016-08-28: 1000 ug via INTRAMUSCULAR

## 2016-09-13 ENCOUNTER — Ambulatory Visit: Payer: 59

## 2016-09-17 ENCOUNTER — Other Ambulatory Visit (INDEPENDENT_AMBULATORY_CARE_PROVIDER_SITE_OTHER): Payer: 59

## 2016-09-17 DIAGNOSIS — Z125 Encounter for screening for malignant neoplasm of prostate: Secondary | ICD-10-CM

## 2016-09-17 DIAGNOSIS — E119 Type 2 diabetes mellitus without complications: Secondary | ICD-10-CM

## 2016-09-17 LAB — HEPATIC FUNCTION PANEL
ALT: 26 U/L (ref 0–53)
AST: 30 U/L (ref 0–37)
Albumin: 4.2 g/dL (ref 3.5–5.2)
Alkaline Phosphatase: 99 U/L (ref 39–117)
BILIRUBIN DIRECT: 0.2 mg/dL (ref 0.0–0.3)
BILIRUBIN TOTAL: 0.7 mg/dL (ref 0.2–1.2)
Total Protein: 6.4 g/dL (ref 6.0–8.3)

## 2016-09-17 LAB — HEMOGLOBIN: HEMOGLOBIN: 14.9 g/dL (ref 13.0–17.0)

## 2016-09-17 LAB — FERRITIN: FERRITIN: 20.3 ng/mL — AB (ref 22.0–322.0)

## 2016-09-17 LAB — HEMOGLOBIN A1C: HEMOGLOBIN A1C: 6.8 % — AB (ref 4.6–6.5)

## 2016-09-17 LAB — PSA: PSA: 0.9 ng/mL (ref 0.10–4.00)

## 2016-09-24 ENCOUNTER — Encounter: Payer: Self-pay | Admitting: Family Medicine

## 2016-09-24 ENCOUNTER — Ambulatory Visit (INDEPENDENT_AMBULATORY_CARE_PROVIDER_SITE_OTHER): Payer: 59 | Admitting: Family Medicine

## 2016-09-24 VITALS — BP 102/68 | HR 68 | Temp 97.8°F | Wt 167.0 lb

## 2016-09-24 DIAGNOSIS — J3489 Other specified disorders of nose and nasal sinuses: Secondary | ICD-10-CM | POA: Insufficient documentation

## 2016-09-24 DIAGNOSIS — E119 Type 2 diabetes mellitus without complications: Secondary | ICD-10-CM | POA: Diagnosis not present

## 2016-09-24 NOTE — Assessment & Plan Note (Signed)
Continue diet. Ferritin at goal. Labs discussed with patient.

## 2016-09-24 NOTE — Progress Notes (Signed)
Pre visit review using our clinic review tool, if applicable. No additional management support is needed unless otherwise documented below in the visit note. 

## 2016-09-24 NOTE — Patient Instructions (Signed)
Recheck labs in about 6 months before a physical.  Ryan Crane will call about your referral. Take care.  Glad to see you.  Update me as needed.

## 2016-09-24 NOTE — Progress Notes (Signed)
Diabetes:  Using medications without difficulties: yes Hypoglycemic episodes:no Hyperglycemic episodes:no Feet problems:no Blood Sugars averaging: ~140 in the AM eye exam within last year: end of 07/2016.   A1c d/w pt.  Sugar has been greatly improved on higher dose of metformin.  Hemachromatosis.  Ferritin at goal.  He is avoiding red meat. He is compliant with his diet. Labs discussed with patient.  PSA wnl.  D/w pt.    L nostril tight at night.  Using vicks with temporary relief.  Going on for years, getting worse.  H/o facial bone fx years ago, in youth.  He can occ get his L nostril fully obstructed.  Clearly worse supine.    Meds, vitals, and allergies reviewed.   ROS: Per HPI unless specifically indicated in ROS section   GEN: nad, alert and oriented HEENT: mucous membranes moist, nasal exam slightly stuffy, no erythema. NECK: supple w/o LA CV: rrr. PULM: ctab, no inc wob ABD: soft, +bs EXT: no edema SKIN: no acute rash

## 2016-09-24 NOTE — Assessment & Plan Note (Addendum)
A1c controlled. Continue metformin as is. Continue work on diet. Recheck in about 6 months. He agrees.>25 minutes spent in face to face time with patient, >50% spent in counselling or coordination of care

## 2016-09-24 NOTE — Assessment & Plan Note (Signed)
Dissecting going on for years, getting worse. He has transient obstructive symptoms. I can only examine the anterior portion the nose. Discussed with patient. Refer to ENT. It would be worth getting ENT input for further evaluation of the nasal passages and input about possible intervention. I did not start him on a nasal steroid due to his diabetes.

## 2016-09-25 ENCOUNTER — Ambulatory Visit: Payer: 59 | Admitting: Family Medicine

## 2016-10-02 ENCOUNTER — Ambulatory Visit (INDEPENDENT_AMBULATORY_CARE_PROVIDER_SITE_OTHER): Payer: 59

## 2016-10-02 DIAGNOSIS — E538 Deficiency of other specified B group vitamins: Secondary | ICD-10-CM | POA: Diagnosis not present

## 2016-10-02 MED ORDER — CYANOCOBALAMIN 1000 MCG/ML IJ SOLN
1000.0000 ug | Freq: Once | INTRAMUSCULAR | Status: AC
  Administered 2016-10-02: 1000 ug via INTRAMUSCULAR

## 2016-10-09 DIAGNOSIS — J342 Deviated nasal septum: Secondary | ICD-10-CM | POA: Insufficient documentation

## 2016-10-11 ENCOUNTER — Ambulatory Visit: Payer: 59

## 2016-11-06 ENCOUNTER — Ambulatory Visit (INDEPENDENT_AMBULATORY_CARE_PROVIDER_SITE_OTHER): Payer: 59 | Admitting: *Deleted

## 2016-11-06 DIAGNOSIS — E538 Deficiency of other specified B group vitamins: Secondary | ICD-10-CM | POA: Diagnosis not present

## 2016-11-06 MED ORDER — CYANOCOBALAMIN 1000 MCG/ML IJ SOLN
1000.0000 ug | Freq: Once | INTRAMUSCULAR | Status: AC
  Administered 2016-11-06: 1000 ug via INTRAMUSCULAR

## 2016-11-12 ENCOUNTER — Other Ambulatory Visit: Payer: Self-pay | Admitting: Family Medicine

## 2016-11-28 ENCOUNTER — Telehealth: Payer: Self-pay | Admitting: Family Medicine

## 2016-11-28 MED ORDER — METFORMIN HCL 500 MG PO TABS: ORAL_TABLET | ORAL | Status: DC

## 2016-11-28 NOTE — Telephone Encounter (Signed)
Patient notified as instructed by telephone and verbalized understanding. 

## 2016-11-28 NOTE — Telephone Encounter (Signed)
Pt dropped off readings  In rx tower

## 2016-11-28 NOTE — Telephone Encounter (Signed)
Log reviewed.  I would have him try taking higher dose of metformin, 2 tabs in the AM and 1 tab in the PM.   See if he can tolerate that and update me as needed.   If sugar isn't better or if he can't tolerate med, then let me know.  Thanks.   Med list updated.

## 2016-11-28 NOTE — Telephone Encounter (Signed)
Readings are on your desk

## 2016-12-06 ENCOUNTER — Telehealth: Payer: Self-pay

## 2016-12-06 MED ORDER — METFORMIN HCL 500 MG PO TABS: 1000.0000 mg | ORAL_TABLET | Freq: Two times a day (BID) | ORAL | 99 refills | Status: DC

## 2016-12-06 NOTE — Telephone Encounter (Signed)
Mrs Arminio called with update on pts FBS. Pt has been taking metformin 500 mg taking 2 in AM and 1 tab in PM.  12/02/16 FBS 158 12/03/16 FBS 104 12/04/16 FBS 128 12/05/16 FBS 166 12/06/16 FBS 162 Mrs Tkachenko wants to know if pt should continue this dosage and if so will need # 90 refill to [facility] [address]. Mrs Krzywicki request cb.

## 2016-12-06 NOTE — Telephone Encounter (Signed)
I would try going up to 2 tabs BID and see if he can tolerate that.  That is the max dose.  If not tolerated due to GI side effects or low sugars, then cut back to 2+1 daily.  Thanks.  rx sent.

## 2016-12-07 NOTE — Telephone Encounter (Signed)
Patient states he understand instructions from voicemail.

## 2016-12-07 NOTE — Telephone Encounter (Signed)
Left detailed message on voicemail.  

## 2016-12-09 ENCOUNTER — Other Ambulatory Visit: Payer: Self-pay | Admitting: Family Medicine

## 2016-12-11 ENCOUNTER — Ambulatory Visit (INDEPENDENT_AMBULATORY_CARE_PROVIDER_SITE_OTHER): Payer: 59

## 2016-12-11 DIAGNOSIS — E538 Deficiency of other specified B group vitamins: Secondary | ICD-10-CM | POA: Diagnosis not present

## 2016-12-11 MED ORDER — CYANOCOBALAMIN 1000 MCG/ML IJ SOLN
1000.0000 ug | Freq: Once | INTRAMUSCULAR | Status: AC
  Administered 2016-12-11: 1000 ug via INTRAMUSCULAR

## 2017-01-15 ENCOUNTER — Ambulatory Visit (INDEPENDENT_AMBULATORY_CARE_PROVIDER_SITE_OTHER): Payer: 59

## 2017-01-15 DIAGNOSIS — E538 Deficiency of other specified B group vitamins: Secondary | ICD-10-CM

## 2017-01-15 MED ORDER — CYANOCOBALAMIN 1000 MCG/ML IJ SOLN
1000.0000 ug | Freq: Once | INTRAMUSCULAR | Status: AC
  Administered 2017-01-15: 1000 ug via INTRAMUSCULAR

## 2017-02-18 DIAGNOSIS — E0789 Other specified disorders of thyroid: Secondary | ICD-10-CM | POA: Diagnosis not present

## 2017-02-19 ENCOUNTER — Ambulatory Visit (INDEPENDENT_AMBULATORY_CARE_PROVIDER_SITE_OTHER): Payer: 59

## 2017-02-19 DIAGNOSIS — E538 Deficiency of other specified B group vitamins: Secondary | ICD-10-CM | POA: Diagnosis not present

## 2017-02-19 MED ORDER — CYANOCOBALAMIN 1000 MCG/ML IJ SOLN
1000.0000 ug | Freq: Once | INTRAMUSCULAR | Status: AC
  Administered 2017-02-19: 1000 ug via INTRAMUSCULAR

## 2017-02-26 DIAGNOSIS — E118 Type 2 diabetes mellitus with unspecified complications: Secondary | ICD-10-CM | POA: Diagnosis not present

## 2017-02-26 DIAGNOSIS — E032 Hypothyroidism due to medicaments and other exogenous substances: Secondary | ICD-10-CM | POA: Diagnosis not present

## 2017-03-01 ENCOUNTER — Other Ambulatory Visit: Payer: Self-pay | Admitting: Family Medicine

## 2017-03-04 ENCOUNTER — Other Ambulatory Visit: Payer: Self-pay | Admitting: Family Medicine

## 2017-03-04 NOTE — Telephone Encounter (Signed)
Ryan Crane request status of meds requested refills on today from pharmacy ; advised already refilled and pt wife will ck with pharmacy.

## 2017-03-17 ENCOUNTER — Other Ambulatory Visit: Payer: Self-pay | Admitting: Family Medicine

## 2017-03-17 DIAGNOSIS — E538 Deficiency of other specified B group vitamins: Secondary | ICD-10-CM

## 2017-03-17 DIAGNOSIS — E119 Type 2 diabetes mellitus without complications: Secondary | ICD-10-CM

## 2017-03-25 ENCOUNTER — Other Ambulatory Visit (INDEPENDENT_AMBULATORY_CARE_PROVIDER_SITE_OTHER): Payer: 59

## 2017-03-25 ENCOUNTER — Other Ambulatory Visit: Payer: 59

## 2017-03-25 DIAGNOSIS — E119 Type 2 diabetes mellitus without complications: Secondary | ICD-10-CM

## 2017-03-25 DIAGNOSIS — E538 Deficiency of other specified B group vitamins: Secondary | ICD-10-CM

## 2017-03-25 LAB — CBC WITH DIFFERENTIAL/PLATELET
BASOS PCT: 0.5 % (ref 0.0–3.0)
Basophils Absolute: 0 10*3/uL (ref 0.0–0.1)
EOS PCT: 3.5 % (ref 0.0–5.0)
Eosinophils Absolute: 0.2 10*3/uL (ref 0.0–0.7)
HEMATOCRIT: 44.4 % (ref 39.0–52.0)
HEMOGLOBIN: 15.3 g/dL (ref 13.0–17.0)
LYMPHS PCT: 23.2 % (ref 12.0–46.0)
Lymphs Abs: 1.1 10*3/uL (ref 0.7–4.0)
MCHC: 34.5 g/dL (ref 30.0–36.0)
MCV: 93.5 fl (ref 78.0–100.0)
MONO ABS: 0.5 10*3/uL (ref 0.1–1.0)
Monocytes Relative: 11.2 % (ref 3.0–12.0)
Neutro Abs: 2.8 10*3/uL (ref 1.4–7.7)
Neutrophils Relative %: 61.6 % (ref 43.0–77.0)
Platelets: 192 10*3/uL (ref 150.0–400.0)
RBC: 4.75 Mil/uL (ref 4.22–5.81)
RDW: 13 % (ref 11.5–15.5)
WBC: 4.6 10*3/uL (ref 4.0–10.5)

## 2017-03-25 LAB — TSH: TSH: 5.29 u[IU]/mL — ABNORMAL HIGH (ref 0.35–4.50)

## 2017-03-25 LAB — FERRITIN: Ferritin: 23.2 ng/mL (ref 22.0–322.0)

## 2017-03-25 LAB — LIPID PANEL
CHOL/HDL RATIO: 5
Cholesterol: 172 mg/dL (ref 0–200)
HDL: 38 mg/dL — AB (ref >39.00)
LDL Cholesterol: 104 mg/dL — ABNORMAL HIGH (ref 0–99)
NonHDL: 134.38
TRIGLYCERIDES: 150 mg/dL — AB (ref 0.0–149.0)
VLDL: 30 mg/dL (ref 0.0–40.0)

## 2017-03-25 LAB — COMPREHENSIVE METABOLIC PANEL
ALT: 32 U/L (ref 0–53)
AST: 29 U/L (ref 0–37)
Albumin: 4.5 g/dL (ref 3.5–5.2)
Alkaline Phosphatase: 82 U/L (ref 39–117)
BILIRUBIN TOTAL: 0.6 mg/dL (ref 0.2–1.2)
BUN: 21 mg/dL (ref 6–23)
CALCIUM: 9.5 mg/dL (ref 8.4–10.5)
CO2: 28 meq/L (ref 19–32)
Chloride: 101 mEq/L (ref 96–112)
Creatinine, Ser: 1.27 mg/dL (ref 0.40–1.50)
GFR: 60.97 mL/min (ref >60.00)
Glucose, Bld: 141 mg/dL — ABNORMAL HIGH (ref 70–99)
Potassium: 4.7 mEq/L (ref 3.5–5.1)
Sodium: 137 mEq/L (ref 135–145)
Total Protein: 7.1 g/dL (ref 6.0–8.3)

## 2017-03-25 LAB — VITAMIN B12: Vitamin B-12: 384 pg/mL (ref 211–911)

## 2017-03-25 LAB — HEMOGLOBIN A1C: Hgb A1c MFr Bld: 7.5 % — ABNORMAL HIGH (ref 4.6–6.5)

## 2017-03-26 ENCOUNTER — Ambulatory Visit (INDEPENDENT_AMBULATORY_CARE_PROVIDER_SITE_OTHER): Payer: 59

## 2017-03-26 DIAGNOSIS — E538 Deficiency of other specified B group vitamins: Secondary | ICD-10-CM | POA: Diagnosis not present

## 2017-03-26 MED ORDER — CYANOCOBALAMIN 1000 MCG/ML IJ SOLN
1000.0000 ug | Freq: Once | INTRAMUSCULAR | Status: AC
  Administered 2017-03-26: 1000 ug via INTRAMUSCULAR

## 2017-03-28 ENCOUNTER — Encounter: Payer: 59 | Admitting: Family Medicine

## 2017-03-31 ENCOUNTER — Other Ambulatory Visit: Payer: Self-pay | Admitting: Family Medicine

## 2017-04-01 ENCOUNTER — Ambulatory Visit (INDEPENDENT_AMBULATORY_CARE_PROVIDER_SITE_OTHER): Payer: 59 | Admitting: Family Medicine

## 2017-04-01 ENCOUNTER — Encounter: Payer: Self-pay | Admitting: Family Medicine

## 2017-04-01 VITALS — BP 116/74 | HR 71 | Temp 97.9°F | Ht 65.0 in | Wt 167.8 lb

## 2017-04-01 DIAGNOSIS — E0500 Thyrotoxicosis with diffuse goiter without thyrotoxic crisis or storm: Secondary | ICD-10-CM

## 2017-04-01 DIAGNOSIS — I10 Essential (primary) hypertension: Secondary | ICD-10-CM

## 2017-04-01 DIAGNOSIS — Z0000 Encounter for general adult medical examination without abnormal findings: Secondary | ICD-10-CM

## 2017-04-01 DIAGNOSIS — N200 Calculus of kidney: Secondary | ICD-10-CM

## 2017-04-01 DIAGNOSIS — E538 Deficiency of other specified B group vitamins: Secondary | ICD-10-CM

## 2017-04-01 DIAGNOSIS — J3489 Other specified disorders of nose and nasal sinuses: Secondary | ICD-10-CM

## 2017-04-01 DIAGNOSIS — E119 Type 2 diabetes mellitus without complications: Secondary | ICD-10-CM

## 2017-04-01 DIAGNOSIS — Z7189 Other specified counseling: Secondary | ICD-10-CM

## 2017-04-01 DIAGNOSIS — E785 Hyperlipidemia, unspecified: Secondary | ICD-10-CM

## 2017-04-01 DIAGNOSIS — R1011 Right upper quadrant pain: Secondary | ICD-10-CM

## 2017-04-01 MED ORDER — LISINOPRIL 20 MG PO TABS: ORAL_TABLET | ORAL | 12 refills | Status: DC

## 2017-04-01 MED ORDER — FLUTICASONE PROPIONATE 50 MCG/ACT NA SUSP: 1.0000 | Freq: Every day | NASAL | Status: AC

## 2017-04-01 MED ORDER — METFORMIN HCL 500 MG PO TABS: 1000.0000 mg | ORAL_TABLET | Freq: Two times a day (BID) | ORAL | 12 refills | Status: DC

## 2017-04-01 MED ORDER — FENOFIBRATE 160 MG PO TABS: ORAL_TABLET | ORAL | 12 refills | Status: DC

## 2017-04-01 MED ORDER — OMEPRAZOLE 40 MG PO CPDR: DELAYED_RELEASE_CAPSULE | ORAL | 12 refills | Status: DC

## 2017-04-01 NOTE — Progress Notes (Signed)
CPE- See plan.  Routine anticipatory guidance given to patient.  See health maintenance.  The possibility exists that previously documented standard health maintenance information may have been brought forward from a previous encounter into this note.  If needed, that same information has been updated to reflect the current situation based on today's encounter.    Tetanus 2009  Flu done 2017 Shingles shot prev done.  PNA shot 2017 Colonoscopy 2013  PSA prev wnl.  D/w pt about options.  Would be okay to check with physicals in the future.    Living will d/w pt. Would have his wife designated if pt were incapacitated.  Diet and exercise d/w pt. He's working on both, esp diet with hemochromatosis.  He still has episodic RUQ discomfort.  Can happen at rest, doesn't happen with exertion.  Slightly worse recently.  Stretching helps some.  Per patient, it feels deeper than the abd wall.  No stool changes, no bloody stools.  No vomiting.  No nausea.    Hypothyroidism.  No ADE on med.  TSH minimally elevated.  He has endo f/u about once a year.  Likely not beneficial to change thyroid replacement dose, d/w pt.    He is having less nasal sx recently.  He decided against intervention.    Hypertension:    Using medication without problems or lightheadedness: yes Chest pain with exertion:no Edema:no Short of breath:no  Elevated Cholesterol: Using medications without problems:yes Muscle aches: no Diet compliance: encouraged Exercise: encouraged  B12 def.  On replacement w/o ADE.  Labs d/w pt.    h/o renal stones.  He has some oxycodone to use if needed.  He rarely has an episode since he has been drinking more water.    Diabetes:  Using medications without difficulties: Hypoglycemic episodes: rare, not unless prolonged fasting.  Hyperglycemic episodes:no Feet problems:no Blood Sugars averaging: 130-170s eye exam within last year: yes His sugar had been creeping up slowly over the last few  months but he noted sig lower sugar the day after more exercise.  D/w pt.   A1c d/w pt.    Hemochromatosis.  No abd pain, diet d/w pt.  Labs d/w pt.    PMH and SH reviewed  Meds, vitals, and allergies reviewed.   ROS: Per HPI.  Unless specifically indicated otherwise in HPI, the patient denies:  General: fever. Eyes: acute vision changes ENT: sore throat Cardiovascular: chest pain Respiratory: SOB GI: vomiting GU: dysuria Musculoskeletal: acute back pain Derm: acute rash Neuro: acute motor dysfunction Psych: worsening mood Endocrine: polydipsia Heme: bleeding Allergy: hayfever  GEN: nad, alert and oriented HEENT: mucous membranes moist NECK: supple w/o LA CV: rrr. PULM: ctab, no inc wob ABD: soft, +bs, not tender to palpation. No rebound. EXT: no edema SKIN: no acute rash  Diabetic foot exam: Normal inspection No skin breakdown No calluses  Normal DP pulses Normal sensation to light touch and monofilament Nails normal

## 2017-04-01 NOTE — Patient Instructions (Signed)
Rosaria Ferries will call about your referral. Recheck labs in about 3-4 months, prior to the visit.  Take care.  Glad to see you.  Keep exercising.  Thanks for your effort.

## 2017-04-01 NOTE — Progress Notes (Signed)
Pre visit review using our clinic review tool, if applicable. No additional management support is needed unless otherwise documented below in the visit note. 

## 2017-04-03 DIAGNOSIS — R1011 Right upper quadrant pain: Secondary | ICD-10-CM | POA: Insufficient documentation

## 2017-04-03 NOTE — Assessment & Plan Note (Signed)
Tetanus 2009  Flu done 2017 Shingles shot prev done.  PNA shot 2017 Colonoscopy 2013  PSA prev wnl.  D/w pt about options.  Would be okay to check with physicals.   Living will d/w pt. Would have his wife designated if pt were incapacitated.  Diet and exercise d/w pt. He's working on both, esp diet with hemochromatosis.

## 2017-04-03 NOTE — Assessment & Plan Note (Signed)
He is controlling this with diet. LFTs still normal. We can recheck periodically. He agrees.

## 2017-04-03 NOTE — Assessment & Plan Note (Signed)
Fortunately having rare symptoms. Use oxycodone as needed

## 2017-04-03 NOTE — Assessment & Plan Note (Signed)
Controlled. Continue work on diet and exercise. He agrees.

## 2017-04-03 NOTE — Assessment & Plan Note (Signed)
A1c up some. He is to work more on exercise and we will recheck in 3 months. He agrees.

## 2017-04-03 NOTE — Assessment & Plan Note (Signed)
He is having less nasal symptoms so he has decided against intervention at this point.

## 2017-04-03 NOTE — Assessment & Plan Note (Signed)
No jaundice. No vomiting. LFTs still normal. Given the recurrent symptoms check ultrasound. He may end up needing HIDA scan. Discussed with patient. He agrees. Not an acute abdomen. Okay for outpatient follow-up.

## 2017-04-03 NOTE — Assessment & Plan Note (Signed)
TSH minimally elevated. Likely not beneficial to change thyroid replacement dose at this point. He agrees.

## 2017-04-03 NOTE — Assessment & Plan Note (Signed)
Slightly lower. Continue as is. Recheck in 3 months. He agrees.

## 2017-04-03 NOTE — Assessment & Plan Note (Signed)
Reasonable control.  Continue as is.  He agrees. 

## 2017-04-03 NOTE — Assessment & Plan Note (Signed)
Living will d/w pt. Would have his wife designated if pt were incapacitated. 

## 2017-04-09 ENCOUNTER — Other Ambulatory Visit: Payer: Self-pay | Admitting: Family Medicine

## 2017-04-09 ENCOUNTER — Ambulatory Visit
Admission: RE | Admit: 2017-04-09 | Discharge: 2017-04-09 | Disposition: A | Discharge status: Home / Self Care | Payer: 59 | Source: Ambulatory Visit | Attending: Family Medicine | Admitting: Family Medicine

## 2017-04-09 DIAGNOSIS — K8020 Calculus of gallbladder without cholecystitis without obstruction: Secondary | ICD-10-CM | POA: Diagnosis not present

## 2017-04-09 DIAGNOSIS — R1011 Right upper quadrant pain: Secondary | ICD-10-CM

## 2017-04-09 DIAGNOSIS — R932 Abnormal findings on diagnostic imaging of liver and biliary tract: Secondary | ICD-10-CM

## 2017-04-10 ENCOUNTER — Other Ambulatory Visit: Payer: Self-pay | Admitting: Family Medicine

## 2017-04-10 DIAGNOSIS — Z77018 Contact with and (suspected) exposure to other hazardous metals: Secondary | ICD-10-CM

## 2017-04-10 DIAGNOSIS — R932 Abnormal findings on diagnostic imaging of liver and biliary tract: Secondary | ICD-10-CM

## 2017-04-23 ENCOUNTER — Ambulatory Visit
Admission: RE | Admit: 2017-04-23 | Discharge: 2017-04-23 | Disposition: A | Discharge status: Home / Self Care | Payer: 59 | Source: Ambulatory Visit | Attending: Family Medicine | Admitting: Family Medicine

## 2017-04-23 DIAGNOSIS — Z77018 Contact with and (suspected) exposure to other hazardous metals: Secondary | ICD-10-CM

## 2017-04-23 DIAGNOSIS — Z135 Encounter for screening for eye and ear disorders: Secondary | ICD-10-CM | POA: Diagnosis not present

## 2017-04-23 DIAGNOSIS — K769 Liver disease, unspecified: Secondary | ICD-10-CM | POA: Diagnosis not present

## 2017-04-23 DIAGNOSIS — R932 Abnormal findings on diagnostic imaging of liver and biliary tract: Secondary | ICD-10-CM

## 2017-04-23 MED ORDER — GADOXETATE DISODIUM 0.25 MMOL/ML IV SOLN
8.0000 mL | Freq: Once | INTRAVENOUS | Status: AC | PRN
  Administered 2017-04-23: 8 mL via INTRAVENOUS

## 2017-04-25 DIAGNOSIS — K8020 Calculus of gallbladder without cholecystitis without obstruction: Secondary | ICD-10-CM | POA: Diagnosis not present

## 2017-04-26 ENCOUNTER — Ambulatory Visit: Payer: Self-pay | Admitting: Surgery

## 2017-04-26 NOTE — H&P (Signed)
Ryan Crane 04/25/2017 10:27 AM Location: [facility] Patient #: [id number] DOB: 1956/10/11 Married / Language: English / Race: White Male   History of Present Illness (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 His symptoms are unusual-about 5 years ago he began experiencing intermittent right upper quadrant abdominal pain which she describes as feeling like a blunt object is poking into his abdominal wall. This happens when he bends over and is very positional. It increased in frequency about 2-3 years ago and ultimately he underwent a right upper quadrant ultrasound last month that did show gallstones largest of which was 8 mm. It did describe wall thickening portions of the fundus of the gallbladder but no other signs of cholecystitis. His common bile duct was 4 mm. It also noted subtle abnormality in texture of the liver concerning for cirrhosis and a 1 x 2 cm lesions concerning for tumor. He does have a history of hemochromatosis for which she is on a strict diet but has not had have any phlebotomy in recent years. Subsequent follow-up MRI image treated no morphologic findings of cirrhosis, a 13 mm benign hemangioma in segment 5, and no suspicious or enhancing hepatic lesions, as well as a 5 mm uncinate pancreatic cyst which had no clear to medication with pancreatic duct.Marland Kitchen He continues to have intermittent right upper quadrant pain but it is not associated anyway with eating. There is no associated nausea or other GI symptoms. It does seem to be very positional and almost musculoskeletal. He also has a history of gallstones with for which he periodically takes oxycodone.  The patient is a 61 year old male.   Past Surgical History [name], [name]; 04/25/2017 10:28 AM) Colon Polyp Removal - Colonoscopy   Diagnostic  Studies History [name], CMA; 04/25/2017 10:28 AM) Colonoscopy  1-5 years ago  Allergies [name], [name]; 04/25/2017 10:30 AM) Flomax *GENITOURINARY AGENTS - MISCELLANEOUS*   Medication History [name], CMA; 04/25/2017 10:31 AM) Fenofibrate (160MG  Tablet, Oral) Active. Lisinopril (20MG  Tablet, Oral) Active. MetFORMIN HCl (500MG  Tablet, Oral) Active. Omeprazole (40MG  Capsule DR, Oral) Active. Synthroid (175MCG Tablet, Oral) Active. Medications Reconciled  Social History [name], CMA; 04/25/2017 10:28 AM) No alcohol use  No caffeine use  No drug use  Tobacco use  Never smoker.  Family History [name], [place]; 04/25/2017 10:28 AM) Arthritis  Mother. Diabetes Mellitus  Father. Hypertension  Father, Mother. Respiratory Condition  Mother.  Other Problems [name], CMA; 04/25/2017 10:28 AM) Arthritis  Diabetes Mellitus  Gastroesophageal Reflux Disease  High blood pressure  Kidney Stone  Other disease, cancer, significant illness  Thyroid Disease     Review of Systems ([name]; 04/25/2017 10:28 AM) General Not Present- Appetite Loss, Chills, Fatigue, Fever, Night Sweats, Weight Gain and Weight Loss. Skin Not Present- Change in Wart/Mole, Dryness, Hives, Jaundice, New Lesions, Non-Healing Wounds, Rash and Ulcer. HEENT Present- Wears glasses/contact lenses. Not Present- Earache, Hearing Loss, Hoarseness, Nose Bleed, Oral Ulcers, Ringing in the Ears, Seasonal Allergies, Sinus Pain, Sore Throat, Visual Disturbances and Yellow Eyes. Respiratory Not Present- Bloody sputum, Chronic Cough, Difficulty Breathing, Snoring and Wheezing. Breast Not Present- Breast Mass, Breast Pain, Nipple Discharge and Skin Changes. Cardiovascular Present- Leg Cramps. Not Present- Chest Pain, Difficulty Breathing Lying Down, Palpitations, Rapid Heart Rate, Shortness of Breath and Swelling of Extremities. Gastrointestinal Present- Excessive gas. Not Present-  Abdominal Pain, Bloating, Bloody Stool, Change in Bowel Habits, Chronic diarrhea, Constipation, Difficulty Swallowing, Gets full quickly at meals, Hemorrhoids, Indigestion, Nausea, Rectal Pain and Vomiting. Male Genitourinary Not Present- Blood in Urine, Change in Urinary Stream, Frequency, Impotence, Nocturia, Painful Urination, Urgency and Urine Leakage. Musculoskeletal Not Present- Back Pain, Joint Pain, Joint Stiffness, Muscle Pain, Muscle Weakness and Swelling of Extremities. Neurological Not Present- Decreased Memory, Fainting, Headaches, Numbness, Seizures, Tingling, Tremor, Trouble walking and Weakness. Psychiatric Not Present- Anxiety, Bipolar, Change in Sleep Pattern, Depression, Fearful and Frequent crying.  Vitals ([name]; 04/25/2017 10:29 AM) 04/25/2017 10:28 AM Weight: 164.25 lb Height: 68in Body Surface Area: 1.88 m Body Mass Index: 24.97 kg/m  Temp.: 98.59F  Pulse: 83 (Regular)  P.OX: 97% (Room air) BP: 98/62 (Sitting, Left Arm, Standard)       Physical Exam ([name]; 04/25/2017 12:02 PM) The physical exam findings are as follows: Note:He is alert and oriented, well-appearing Anicteric, extra ocular motions intact Neck without mass or thyromegaly Moist mucous membranes, good dentition Unlabored respirations, symmetrical air entry Regular rate and rhythm, no pedal edema Abdomen is soft, nontender nondistended. There is no hernia or mass. No right upper quadrant tenderness but he can point with one finger to the location of the pain and it is in the right upper quadrant subcostal area. Extremities are warm and well perfused, no deformity Neuro grossly normal, normal gait Psych normal mood and affect, appropriate insight Skin no lesions or rashes on limited skin exam    Assessment & Plan ([name]; 04/25/2017 12:04 PM) CHOLELITHIASIS (K80.20) Story: He has cholelithiasis based on both recent MRI and ultrasound. His symptoms  are unusual for gallbladder pain. I discussed with him that I'm not 100% confident that removing his gallbladder will alleviate the right upper quadrant pain that he has been having based on the circumstances in which it occurs. We discussed laparoscopic cholecystectomy, the risks of surgery including bleeding, infection, pain, scarring, intra-abdominal injury specifically the common bile duct and sequelae of that, as well as the possibility that gallbladder surgery will not relieve his symptoms. We also discussed the natural history of gallstones including but the vast majority of people will never have any symptoms from gallstones and that even if you were people with gallstones will experience a serious Condition such as cholecystitis, cholangitis or pancreatitis. I describe each of those syndromes to him in detail and educated in his wife on signs and symptoms that should prompt concerning visit to the ER. They will think about surgery, and will call us if and when he is ready to schedule.

## 2017-04-30 ENCOUNTER — Ambulatory Visit (INDEPENDENT_AMBULATORY_CARE_PROVIDER_SITE_OTHER): Payer: 59 | Admitting: *Deleted

## 2017-04-30 DIAGNOSIS — E538 Deficiency of other specified B group vitamins: Secondary | ICD-10-CM

## 2017-04-30 MED ORDER — CYANOCOBALAMIN 1000 MCG/ML IJ SOLN
1000.0000 ug | Freq: Once | INTRAMUSCULAR | Status: AC
  Administered 2017-04-30: 1000 ug via INTRAMUSCULAR

## 2017-05-13 ENCOUNTER — Telehealth: Payer: Self-pay

## 2017-05-13 NOTE — Telephone Encounter (Signed)
Letter mailed to pt.  

## 2017-05-13 NOTE — Telephone Encounter (Signed)
-----   Message from Algernon Huxley, RN sent at 05/29/2016  3:21 PM EDT ----- Regarding: Labs Pt needs labs in 1 year

## 2017-05-20 ENCOUNTER — Other Ambulatory Visit (INDEPENDENT_AMBULATORY_CARE_PROVIDER_SITE_OTHER): Payer: 59

## 2017-05-20 LAB — CBC WITH DIFFERENTIAL/PLATELET
BASOS ABS: 0 10*3/uL (ref 0.0–0.1)
Basophils Relative: 0.6 % (ref 0.0–3.0)
Eosinophils Absolute: 0.2 10*3/uL (ref 0.0–0.7)
Eosinophils Relative: 3.2 % (ref 0.0–5.0)
HCT: 41.7 % (ref 39.0–52.0)
Hemoglobin: 14.4 g/dL (ref 13.0–17.0)
LYMPHS ABS: 1.4 10*3/uL (ref 0.7–4.0)
Lymphocytes Relative: 26.3 % (ref 12.0–46.0)
MCHC: 34.5 g/dL (ref 30.0–36.0)
MCV: 93.5 fl (ref 78.0–100.0)
MONO ABS: 0.5 10*3/uL (ref 0.1–1.0)
Monocytes Relative: 8.7 % (ref 3.0–12.0)
NEUTROS PCT: 61.2 % (ref 43.0–77.0)
Neutro Abs: 3.2 10*3/uL (ref 1.4–7.7)
Platelets: 192 10*3/uL (ref 150.0–400.0)
RBC: 4.46 Mil/uL (ref 4.22–5.81)
RDW: 13.3 % (ref 11.5–15.5)
WBC: 5.3 10*3/uL (ref 4.0–10.5)

## 2017-05-20 LAB — FERRITIN: FERRITIN: 18.1 ng/mL — AB (ref 22.0–322.0)

## 2017-05-28 ENCOUNTER — Encounter (HOSPITAL_COMMUNITY): Payer: Self-pay | Admitting: *Deleted

## 2017-05-28 MED ORDER — ACETAMINOPHEN 500 MG PO TABS
1000.0000 mg | ORAL_TABLET | ORAL | Status: AC
  Administered 2017-05-29: 1000 mg via ORAL
  Filled 2017-05-28: qty 2

## 2017-05-28 MED ORDER — CEFAZOLIN SODIUM-DEXTROSE 2-4 GM/100ML-% IV SOLN
2.0000 g | INTRAVENOUS | Status: AC
  Administered 2017-05-29: 2 g via INTRAVENOUS

## 2017-05-28 MED ORDER — GABAPENTIN 300 MG PO CAPS
300.0000 mg | ORAL_CAPSULE | ORAL | Status: AC
  Administered 2017-05-29: 300 mg via ORAL
  Filled 2017-05-28: qty 1

## 2017-05-28 NOTE — Progress Notes (Signed)
Pt denies SOB, chest pain, and being under the care of a cardiologist. Pt denies having a stress test, echo and cardiac cath. Pt denies having an EKG and chest x ray within the last year. Pt denies having an A1c within the last 2 months. Pt made aware to stp taking Aspirin, vitamins, fish oil and herbal medications. Do not take any NSAIDs ie: Ibuprofen, Advil, Naproxen, BC and Goody Powder or any medication containing Aspirin. Pt made aware to not take Metformin on DOS. Pt made aware to check BG every 2 hours prior to arrival to hospital on DOS. Pt made aware to treat a BG < 70 with 4 glucose tabs or glucose gel or 4 ounces of apple or cranberry juice, wait 15 minutes after intervention to recheck BG, if BG remains < 70, call Short Stay unit to speak with a nurse. Pt verbalized understanding of all pre-op instructions.

## 2017-05-29 ENCOUNTER — Encounter (HOSPITAL_COMMUNITY)
Admission: RE | Disposition: A | Discharge status: Home / Self Care | Payer: Self-pay | Source: Ambulatory Visit | Attending: Surgery

## 2017-05-29 ENCOUNTER — Ambulatory Visit (HOSPITAL_COMMUNITY): Payer: 59 | Admitting: Anesthesiology

## 2017-05-29 ENCOUNTER — Encounter (HOSPITAL_COMMUNITY): Payer: Self-pay | Admitting: *Deleted

## 2017-05-29 ENCOUNTER — Ambulatory Visit (HOSPITAL_COMMUNITY)
Admission: RE | Admit: 2017-05-29 | Discharge: 2017-05-29 | Disposition: A | Discharge status: Home / Self Care | Payer: 59 | Source: Ambulatory Visit | Attending: Surgery | Admitting: Surgery

## 2017-05-29 DIAGNOSIS — Z8249 Family history of ischemic heart disease and other diseases of the circulatory system: Secondary | ICD-10-CM | POA: Insufficient documentation

## 2017-05-29 DIAGNOSIS — Z87442 Personal history of urinary calculi: Secondary | ICD-10-CM | POA: Diagnosis not present

## 2017-05-29 DIAGNOSIS — K8020 Calculus of gallbladder without cholecystitis without obstruction: Secondary | ICD-10-CM | POA: Diagnosis not present

## 2017-05-29 DIAGNOSIS — Z7982 Long term (current) use of aspirin: Secondary | ICD-10-CM | POA: Insufficient documentation

## 2017-05-29 DIAGNOSIS — Z7984 Long term (current) use of oral hypoglycemic drugs: Secondary | ICD-10-CM | POA: Diagnosis not present

## 2017-05-29 DIAGNOSIS — E119 Type 2 diabetes mellitus without complications: Secondary | ICD-10-CM | POA: Insufficient documentation

## 2017-05-29 DIAGNOSIS — I10 Essential (primary) hypertension: Secondary | ICD-10-CM | POA: Insufficient documentation

## 2017-05-29 DIAGNOSIS — K862 Cyst of pancreas: Secondary | ICD-10-CM | POA: Diagnosis not present

## 2017-05-29 DIAGNOSIS — E785 Hyperlipidemia, unspecified: Secondary | ICD-10-CM | POA: Diagnosis not present

## 2017-05-29 DIAGNOSIS — Z8261 Family history of arthritis: Secondary | ICD-10-CM | POA: Insufficient documentation

## 2017-05-29 DIAGNOSIS — M1990 Unspecified osteoarthritis, unspecified site: Secondary | ICD-10-CM | POA: Insufficient documentation

## 2017-05-29 DIAGNOSIS — K219 Gastro-esophageal reflux disease without esophagitis: Secondary | ICD-10-CM | POA: Insufficient documentation

## 2017-05-29 DIAGNOSIS — Z888 Allergy status to other drugs, medicaments and biological substances status: Secondary | ICD-10-CM | POA: Insufficient documentation

## 2017-05-29 DIAGNOSIS — Z86010 Personal history of colonic polyps: Secondary | ICD-10-CM | POA: Diagnosis not present

## 2017-05-29 DIAGNOSIS — Z79899 Other long term (current) drug therapy: Secondary | ICD-10-CM | POA: Diagnosis not present

## 2017-05-29 DIAGNOSIS — K8010 Calculus of gallbladder with chronic cholecystitis without obstruction: Secondary | ICD-10-CM | POA: Insufficient documentation

## 2017-05-29 DIAGNOSIS — Z833 Family history of diabetes mellitus: Secondary | ICD-10-CM | POA: Diagnosis not present

## 2017-05-29 HISTORY — DX: Fatty (change of) liver, not elsewhere classified: K76.0

## 2017-05-29 HISTORY — DX: Personal history of urinary calculi: Z87.442

## 2017-05-29 HISTORY — DX: Calculus of gallbladder without cholecystitis without obstruction: K80.20

## 2017-05-29 HISTORY — PX: CHOLECYSTECTOMY: SHX55

## 2017-05-29 HISTORY — DX: Personal history of other diseases of the digestive system: Z87.19

## 2017-05-29 LAB — BASIC METABOLIC PANEL
Anion gap: 7 (ref 5–15)
BUN: 22 mg/dL — AB (ref 6–20)
CALCIUM: 9.3 mg/dL (ref 8.9–10.3)
CO2: 25 mmol/L (ref 22–32)
CREATININE: 1.18 mg/dL (ref 0.61–1.24)
Chloride: 103 mmol/L (ref 101–111)
GFR calc Af Amer: >60 mL/min (ref >60)
Glucose, Bld: 135 mg/dL — ABNORMAL HIGH (ref 65–99)
Potassium: 4.6 mmol/L (ref 3.5–5.1)
SODIUM: 135 mmol/L (ref 135–145)

## 2017-05-29 LAB — GLUCOSE, CAPILLARY
GLUCOSE-CAPILLARY: 133 mg/dL — AB (ref 65–99)
GLUCOSE-CAPILLARY: 160 mg/dL — AB (ref 65–99)

## 2017-05-29 SURGERY — LAPAROSCOPIC CHOLECYSTECTOMY
Anesthesia: General

## 2017-05-29 MED ORDER — NEOSTIGMINE METHYLSULFATE 10 MG/10ML IV SOLN
INTRAVENOUS | Status: DC | PRN
  Administered 2017-05-29: 3 mg via INTRAVENOUS

## 2017-05-29 MED ORDER — LACTATED RINGERS IV SOLN
INTRAVENOUS | Status: DC
  Administered 2017-05-29 (×2): via INTRAVENOUS

## 2017-05-29 MED ORDER — FENTANYL CITRATE (PF) 100 MCG/2ML IJ SOLN
INTRAMUSCULAR | Status: DC | PRN
  Administered 2017-05-29 (×3): 50 ug via INTRAVENOUS

## 2017-05-29 MED ORDER — PROPOFOL 10 MG/ML IV BOLUS
INTRAVENOUS | Status: AC
  Filled 2017-05-29: qty 20

## 2017-05-29 MED ORDER — SODIUM CHLORIDE 0.9 % IR SOLN
Status: DC | PRN
  Administered 2017-05-29: 1000 mL

## 2017-05-29 MED ORDER — CHLORHEXIDINE GLUCONATE 4 % EX LIQD: 60.0000 mL | Freq: Once | CUTANEOUS | Status: DC

## 2017-05-29 MED ORDER — DOCUSATE SODIUM 100 MG PO CAPS: 100.0000 mg | ORAL_CAPSULE | Freq: Two times a day (BID) | ORAL | 0 refills | Status: AC

## 2017-05-29 MED ORDER — OXYCODONE HCL 5 MG/5ML PO SOLN: 5.0000 mg | Freq: Once | ORAL | Status: DC | PRN

## 2017-05-29 MED ORDER — HYDROCODONE-ACETAMINOPHEN 5-325 MG PO TABS: 1.0000 | ORAL_TABLET | Freq: Four times a day (QID) | ORAL | 0 refills | Status: DC | PRN

## 2017-05-29 MED ORDER — BUPIVACAINE-EPINEPHRINE 0.25% -1:200000 IJ SOLN
INTRAMUSCULAR | Status: DC | PRN
  Administered 2017-05-29: 10 mL

## 2017-05-29 MED ORDER — GLYCOPYRROLATE 0.2 MG/ML IJ SOLN
INTRAMUSCULAR | Status: DC | PRN
  Administered 2017-05-29: 0.6 mg via INTRAVENOUS
  Administered 2017-05-29: 0.2 mg via INTRAVENOUS

## 2017-05-29 MED ORDER — HYDROMORPHONE HCL 1 MG/ML IJ SOLN: 0.2500 mg | INTRAMUSCULAR | Status: DC | PRN

## 2017-05-29 MED ORDER — PROPOFOL 10 MG/ML IV BOLUS
INTRAVENOUS | Status: DC | PRN
  Administered 2017-05-29: 140 mg via INTRAVENOUS

## 2017-05-29 MED ORDER — PHENYLEPHRINE HCL 10 MG/ML IJ SOLN
INTRAMUSCULAR | Status: DC | PRN
  Administered 2017-05-29: 80 ug via INTRAVENOUS

## 2017-05-29 MED ORDER — ROCURONIUM BROMIDE 100 MG/10ML IV SOLN
INTRAVENOUS | Status: DC | PRN
  Administered 2017-05-29: 40 mg via INTRAVENOUS

## 2017-05-29 MED ORDER — ROCURONIUM BROMIDE 10 MG/ML (PF) SYRINGE
PREFILLED_SYRINGE | INTRAVENOUS | Status: AC
  Filled 2017-05-29: qty 5

## 2017-05-29 MED ORDER — MIDAZOLAM HCL 2 MG/2ML IJ SOLN
INTRAMUSCULAR | Status: AC
  Filled 2017-05-29: qty 2

## 2017-05-29 MED ORDER — OXYCODONE HCL 5 MG PO TABS: 5.0000 mg | ORAL_TABLET | Freq: Once | ORAL | Status: DC | PRN

## 2017-05-29 MED ORDER — 0.9 % SODIUM CHLORIDE (POUR BTL) OPTIME
TOPICAL | Status: DC | PRN
  Administered 2017-05-29: 1000 mL

## 2017-05-29 MED ORDER — PROMETHAZINE HCL 25 MG/ML IJ SOLN: 6.2500 mg | INTRAMUSCULAR | Status: DC | PRN

## 2017-05-29 MED ORDER — SUCCINYLCHOLINE CHLORIDE 200 MG/10ML IV SOSY
PREFILLED_SYRINGE | INTRAVENOUS | Status: AC
  Filled 2017-05-29: qty 10

## 2017-05-29 MED ORDER — LIDOCAINE HCL (CARDIAC) 20 MG/ML IV SOLN
INTRAVENOUS | Status: DC | PRN
  Administered 2017-05-29: 40 mg via INTRAVENOUS

## 2017-05-29 MED ORDER — DOCUSATE SODIUM 100 MG PO CAPS: 100.0000 mg | ORAL_CAPSULE | Freq: Two times a day (BID) | ORAL | 0 refills | Status: DC

## 2017-05-29 MED ORDER — BUPIVACAINE-EPINEPHRINE (PF) 0.25% -1:200000 IJ SOLN
INTRAMUSCULAR | Status: AC
  Filled 2017-05-29: qty 30

## 2017-05-29 MED ORDER — MIDAZOLAM HCL 5 MG/5ML IJ SOLN
INTRAMUSCULAR | Status: DC | PRN
  Administered 2017-05-29: 2 mg via INTRAVENOUS

## 2017-05-29 MED ORDER — ONDANSETRON HCL 4 MG/2ML IJ SOLN
INTRAMUSCULAR | Status: DC | PRN
  Administered 2017-05-29: 4 mg via INTRAVENOUS

## 2017-05-29 MED ORDER — LIDOCAINE 2% (20 MG/ML) 5 ML SYRINGE
INTRAMUSCULAR | Status: AC
  Filled 2017-05-29: qty 5

## 2017-05-29 MED ORDER — FENTANYL CITRATE (PF) 250 MCG/5ML IJ SOLN
INTRAMUSCULAR | Status: AC
  Filled 2017-05-29: qty 5

## 2017-05-29 SURGICAL SUPPLY — 37 items
ADH SKN CLS APL DERMABOND .7 (GAUZE/BANDAGES/DRESSINGS) ×1
APPLIER CLIP 5 13 M/L LIGAMAX5 (MISCELLANEOUS) ×3
APR CLP MED LRG 5 ANG JAW (MISCELLANEOUS) ×1
BAG SPEC RTRVL LRG 6X4 10 (ENDOMECHANICALS) ×1
BLADE CLIPPER SURG (BLADE) IMPLANT
CANISTER SUCT 3000ML PPV (MISCELLANEOUS) ×3 IMPLANT
CHLORAPREP W/TINT 26ML (MISCELLANEOUS) ×3 IMPLANT
CLIP APPLIE 5 13 M/L LIGAMAX5 (MISCELLANEOUS) ×1 IMPLANT
COVER SURGICAL LIGHT HANDLE (MISCELLANEOUS) ×3 IMPLANT
DERMABOND ADVANCED (GAUZE/BANDAGES/DRESSINGS) ×2
DERMABOND ADVANCED .7 DNX12 (GAUZE/BANDAGES/DRESSINGS) ×1 IMPLANT
ELECT REM PT RETURN 9FT ADLT (ELECTROSURGICAL) ×3
ELECTRODE REM PT RTRN 9FT ADLT (ELECTROSURGICAL) ×1 IMPLANT
GLOVE BIO SURGEON STRL SZ 6 (GLOVE) ×3 IMPLANT
GLOVE BIOGEL PI IND STRL 6.5 (GLOVE) ×1 IMPLANT
GLOVE BIOGEL PI INDICATOR 6.5 (GLOVE) ×2
GOWN STRL REUS W/ TWL LRG LVL3 (GOWN DISPOSABLE) ×3 IMPLANT
GOWN STRL REUS W/TWL LRG LVL3 (GOWN DISPOSABLE) ×9
GRASPER SUT TROCAR 14GX15 (MISCELLANEOUS) ×3 IMPLANT
KIT BASIN OR (CUSTOM PROCEDURE TRAY) ×3 IMPLANT
KIT ROOM TURNOVER OR (KITS) ×3 IMPLANT
NDL INSUFFLATION 14GA 120MM (NEEDLE) ×1 IMPLANT
NEEDLE INSUFFLATION 14GA 120MM (NEEDLE) ×3 IMPLANT
NS IRRIG 1000ML POUR BTL (IV SOLUTION) ×3 IMPLANT
PAD ARMBOARD 7.5X6 YLW CONV (MISCELLANEOUS) ×3 IMPLANT
POUCH SPECIMEN RETRIEVAL 10MM (ENDOMECHANICALS) ×3 IMPLANT
SCISSORS LAP 5X35 DISP (ENDOMECHANICALS) ×3 IMPLANT
SET IRRIG TUBING LAPAROSCOPIC (IRRIGATION / IRRIGATOR) ×3 IMPLANT
SLEEVE ENDOPATH XCEL 5M (ENDOMECHANICALS) ×6 IMPLANT
SPECIMEN JAR SMALL (MISCELLANEOUS) ×3 IMPLANT
SUT MNCRL AB 4-0 PS2 18 (SUTURE) ×3 IMPLANT
TOWEL OR 17X24 6PK STRL BLUE (TOWEL DISPOSABLE) ×3 IMPLANT
TOWEL OR 17X26 10 PK STRL BLUE (TOWEL DISPOSABLE) IMPLANT
TRAY LAPAROSCOPIC MC (CUSTOM PROCEDURE TRAY) ×3 IMPLANT
TROCAR XCEL NON-BLD 11X100MML (ENDOMECHANICALS) ×3 IMPLANT
TROCAR XCEL NON-BLD 5MMX100MML (ENDOMECHANICALS) ×3 IMPLANT
TUBING INSUFFLATION (TUBING) ×3 IMPLANT

## 2017-05-29 NOTE — Anesthesia Preprocedure Evaluation (Signed)
Anesthesia Evaluation  Patient identified by MRN, date of birth, ID band Patient awake    Reviewed: Allergy & Precautions, NPO status , Patient's Chart, lab work & pertinent test results  Airway Mallampati: II  TM Distance: >3 FB Neck ROM: Full    Dental no notable dental hx.    Pulmonary neg pulmonary ROS,    Pulmonary exam normal breath sounds clear to auscultation       Cardiovascular hypertension, Pt. on medications negative cardio ROS Normal cardiovascular exam Rhythm:Regular Rate:Normal     Neuro/Psych negative neurological ROS  negative psych ROS   GI/Hepatic negative GI ROS, Neg liver ROS, GERD  ,  Endo/Other  negative endocrine ROSdiabetes, Type 2, Oral Hypoglycemic Agents  Renal/GU negative Renal ROS  negative genitourinary   Musculoskeletal negative musculoskeletal ROS (+) Arthritis ,   Abdominal   Peds negative pediatric ROS (+)  Hematology negative hematology ROS (+)   Anesthesia Other Findings   Reproductive/Obstetrics negative OB ROS                             Anesthesia Physical Anesthesia Plan  ASA: III  Anesthesia Plan: General   Post-op Pain Management:    Induction: Intravenous  PONV Risk Score and Plan: 4 or greater and Ondansetron, Propofol, Midazolam and Treatment may vary due to age or medical condition  Airway Management Planned: Oral ETT  Additional Equipment:   Intra-op Plan:   Post-operative Plan: Extubation in OR  Informed Consent: I have reviewed the patients History and Physical, chart, labs and discussed the procedure including the risks, benefits and alternatives for the proposed anesthesia with the patient or authorized representative who has indicated his/her understanding and acceptance.   Dental advisory given  Plan Discussed with: CRNA  Anesthesia Plan Comments:         Anesthesia Quick Evaluation

## 2017-05-29 NOTE — Transfer of Care (Signed)
Immediate Anesthesia Transfer of Care Note  Patient: Ryan Crane  Procedure(s) Performed: Procedure(s): LAPAROSCOPIC CHOLECYSTECTOMY (N/A)  Patient Location: PACU  Anesthesia Type:General  Level of Consciousness: awake, alert  and oriented  Airway & Oxygen Therapy: Patient Spontanous Breathing and Patient connected to nasal cannula oxygen  Post-op Assessment: Report given to RN, Post -op Vital signs reviewed and stable and Patient moving all extremities X 4  Post vital signs: Reviewed and stable  Last Vitals:  Vitals:   05/29/17 0907 05/29/17 1205  BP: 121/74 (P) 115/78  Pulse: (!) 58   Resp: 20 (P) 12  Temp: 36.6 C (P) 36.4 C    Last Pain:  Vitals:   05/29/17 0907  TempSrc: Oral      Patients Stated Pain Goal: 2 (82/88/33 7445)  Complications: No apparent anesthesia complications

## 2017-05-29 NOTE — Discharge Instructions (Signed)
LAPAROSCOPIC SURGERY: POST OP INSTRUCTIONS  ######################################################################  EAT Gradually transition to a high fiber diet with a fiber supplement over the next few weeks after discharge.  Start with a pureed / full liquid diet (see below)  WALK Walk an hour a day.  Control your pain to do that.    CONTROL PAIN Control pain so that you can walk, sleep, tolerate sneezing/coughing, go up/down stairs.  HAVE A BOWEL MOVEMENT DAILY Keep your bowels regular to avoid problems.  OK to try a laxative to override constipation.  OK to use an antidairrheal to slow down diarrhea.  Call if not better after 2 tries  CALL IF YOU HAVE PROBLEMS/CONCERNS Call if you are still struggling despite following these instructions. Call if you have concerns not answered by these instructions  ######################################################################    1. DIET: Follow a light bland diet the first 24 hours after arrival home, such as soup, liquids, crackers, etc.  Be sure to include lots of fluids daily.  Avoid fast food or heavy meals as your are more likely to get nauseated.  Eat a low fat the next few days after surgery.   2. Take your usually prescribed home medications unless otherwise directed. 3. PAIN CONTROL: a. Pain is best controlled by a usual combination of three different methods TOGETHER: i. Ice/Heat ii. Over the counter pain medication iii. Prescription pain medication b. Most patients will experience some swelling and bruising around the incisions.  Ice packs or heating pads (30-60 minutes up to 6 times a day) will help. Use ice for the first few days to help decrease swelling and bruising, then switch to heat to help relax tight/sore spots and speed recovery.  Some people prefer to use ice alone, heat alone, alternating between ice & heat.  Experiment to what works for you.  Swelling and bruising can take several weeks to resolve.   c. It is  helpful to take an over-the-counter pain medication regularly for the first few weeks.  Choose one of the following that works best for you: i. Naproxen (Aleve, etc)  Two 251m tabs twice a day ii. Ibuprofen (Advil, etc) Three 2053mtabs four times a day (every meal & bedtime) iii. Acetaminophen (Tylenol, etc) 500-65044mour times a day (every meal & bedtime) d. A  prescription for pain medication (such as oxycodone, hydrocodone, etc) should be given to you upon discharge.  Take your pain medication as prescribed.  i. If you are having problems/concerns with the prescription medicine (does not control pain, nausea, vomiting, rash, itching, etc), please call us [phone] see if we need to switch you to a different pain medicine that will work better for you and/or control your side effect better. ii. If you need a refill on your pain medication, please contact your pharmacy.  They will contact our office to request authorization. Prescriptions will not be filled after 5 pm or on week-ends. 4. Avoid getting constipated.  Between the surgery and the pain medications, it is common to experience some constipation.  Increasing fluid intake and taking a fiber supplement (such as Metamucil, Citrucel, FiberCon, MiraLax, etc) 1-2 times a day regularly will usually help prevent this problem from occurring.  A mild laxative (prune juice, Milk of Magnesia, MiraLax, etc) should be taken according to package directions if there are no bowel movements after 48 hours.   5. Watch out for diarrhea.  If you have many loose bowel movements, simplify your diet to bland foods & liquids for  a few days.  Stop any stool softeners and decrease your fiber supplement.  Switching to mild anti-diarrheal medications (Kayopectate, Pepto Bismol) can help.  If this worsens or does not improve, please call us. 6. Wash / shower every day.  You may shower over the dressings as they are waterproof.  Continue to shower over incision(s)  after the dressing is off. 7. The skin glue will peel off after about 2 weeks.  You may leave the incision open to air.  You may replace a dressing/Band-Aid to cover the incision for comfort if you wish.  8. ACTIVITIES as tolerated:   a. You may resume regular (light) daily activities beginning the next day--such as daily self-care, walking, climbing stairs--gradually increasing activities as tolerated.  If you can walk 30 minutes without difficulty, it is safe to try more intense activity such as jogging, treadmill, bicycling, low-impact aerobics, swimming, etc. b. Save the most intensive and strenuous activity for last such as sit-ups, heavy lifting, contact sports, etc  Refrain from any heavy lifting or straining until you are off narcotics for pain control and at least 4 weeks after surgery.   c. DO NOT PUSH THROUGH PAIN.  Let pain be your guide: If it hurts to do something, don't do it.  Pain is your body warning you to avoid that activity for another week until the pain goes down. d. You may drive when you are no longer taking prescription pain medication, you can comfortably wear a seatbelt, and you can safely maneuver your car and apply brakes. e. Ryan Crane may have sexual intercourse when it is comfortable.  9. FOLLOW UP in our office a. Please call [facility] at [phone] to set up an appointment to see your surgeon in the office for a follow-up appointment approximately 2-3 weeks after your surgery. b. Make sure that you call for this appointment the day you arrive home to insure a convenient appointment time. 10. IF YOU HAVE DISABILITY OR FAMILY LEAVE FORMS, BRING THEM TO THE OFFICE FOR PROCESSING.  DO NOT GIVE THEM TO YOUR DOCTOR.   WHEN TO CALL us [phone]: 1. Poor pain control 2. Reactions / problems with new medications (rash/itching, nausea, etc)  3. Fever over 101.5 F (38.5 C) 4. Inability to urinate 5. Nausea and/or vomiting 6. Worsening swelling or bruising 7. Continued  bleeding from incision. 8. Increased pain, redness, or drainage from the incision   The clinic staff is available to answer your questions during regular business hours (8:30am-5pm).  Please dont hesitate to call and ask to speak to one of our nurses for clinical concerns.   If you have a medical emergency, go to the nearest emergency room or call 911.  A surgeon from [facility] is always on call at the [facility], [place], [place], [place], [place]  [address] ? MAIN: [phone] ? TOLL FREE: [phone] ?  FAX [phone] [email or web address]

## 2017-05-29 NOTE — Op Note (Signed)
Operative Note  Ryan Crane 61 y.o. male [id number]  05/29/2017  Surgeon: Clovis Riley   Assistant: OR staff  Procedure performed: Laparoscopic Cholecystectomy  Preop diagnosis: symptomatic cholelithiasis Post-op diagnosis/intraop findings: same  Specimens: gallbladder  EBL: minimal  Complications: none  Description of procedure: After obtaining informed consent the patient was brought to the operating room. Prophylactic antibiotics and subcutaneous heparin were administered. SCD's were applied. General endotracheal anesthesia was initiated and a formal time-out was performed. The abdomen was prepped and draped in the usual sterile fashion and the abdomen was entered using an infraumbilical veress needle after instilling the site with local. Insufflation to 43mmHg was obtained, 23mm trocar and camera introduced and gross inspection revealed no evidence of injury from our entry or other intraabdominal abnormalities. Two 70mm trocars were introduced in the right midclavicular and right anterior axillary lines under direct visualization and following infiltration with local. An 70mm trocar was placed in the epigastrium. The liver was mildly nodular in appearance and stiff. The gallbladder was retracted cephalad and the infundibulum was retracted laterally. There was fatty infiltration of the peritoneum overlying the cystic triangle. A combination of hook electrocautery and blunt dissection was utilized to clear the peritoneum from the neck and cystic duct, circumferentially isolating the cystic artery and cystic duct and lifting the gallbladder from the cystic plate. The critical view of safety was achieved with the cystic artery, cystic duct, and liver bed visualized between them with no other structures. The artery was clipped with a two clips proximally and one distally and divided as was the cystic duct with two clips on the proximal end. The gallbladder was dissected from the liver plate  using electrocautery. Once freed the gallbladder was placed in an endocatch bag and removed intact through the epigastric trocar site. Hemostasis was once again confirmed, and reinspection of the abdomen revealed no injuries. The clips were well opposed without any bile leak from the duct or the liver bed. The 60mm trocar site in the epigastrium was closed with a 0 vicryl in the fascia under direct visualization using a PMI device. The abdomen was desufflated and all trocars removed. The skin incisions were closed with running subcuticular monocryl and Dermabond. The patient was awakened, extubated and transported to the recovery room in stable condition.   All counts were correct at the completion of the case.

## 2017-05-29 NOTE — H&P (Signed)
Ryan Crane Patient #: [id number] DOB: Dec 25, 1955 Married / Language: Cleophus Molt / Race: White Male   History of Present Illness  Patient words: This is a very nice 61 year old gentleman who works as a Government social research officer He presents to discuss cholecystectomy. His symptoms are unusual-about 5 years ago he began experiencing intermittent right upper quadrant abdominal pain which she describes as feeling like a blunt object is poking into his abdominal wall. This happens when he bends over and is very positional. It increased in frequency about 2-3 years ago and ultimately he underwent a right upper quadrant ultrasound last month that did show gallstones largest of which was 8 mm. It did describe wall thickening portions of the fundus of the gallbladder but no other signs of cholecystitis. His common bile duct was 4 mm. It also noted subtle abnormality in texture of the liver concerning for cirrhosis and a 1 x 2 cm lesions concerning for tumor. He does have a history of hemochromatosis for which she is on a strict diet but has not had have any phlebotomy in recent years. Subsequent follow-up MRI image treated no morphologic findings of cirrhosis, a 13 mm benign hemangioma in segment 5, and no suspicious or enhancing hepatic lesions, as well as a 5 mm uncinate pancreatic cyst which had no clear to medication with pancreatic duct.Marland Kitchen He continues to have intermittent right upper quadrant pain but it is not associated anyway with eating. There is no associated nausea or other GI symptoms. It does seem to be very positional and almost musculoskeletal. He also has a history of gallstones with for which he periodically takes oxycodone.   Past Surgical History  Colon Polyp Removal - Colonoscopy   Diagnostic Studies History  Colonoscopy  1-5 years ago  Allergies  Flomax *GENITOURINARY AGENTS - MISCELLANEOUS*   Medication History  Fenofibrate (160MG  Tablet, Oral) Active. Lisinopril (20MG   Tablet, Oral) Active. MetFORMIN HCl (500MG  Tablet, Oral) Active. Omeprazole (40MG  Capsule DR, Oral) Active. Synthroid (175MCG Tablet, Oral) Active. Medications Reconciled  Social History  No alcohol use  No caffeine use  No drug use  Tobacco use  Never smoker.  Family History  Arthritis  Mother. Diabetes Mellitus  Father. Hypertension  Father, Mother. Respiratory Condition  Mother.  Other Problems  Arthritis  Diabetes Mellitus  Gastroesophageal Reflux Disease  High blood pressure  Kidney Stone  Other disease, cancer, significant illness  Thyroid Disease     Review of Systems  General Not Present- Appetite Loss, Chills, Fatigue, Fever, Night Sweats, Weight Gain and Weight Loss. Skin Not Present- Change in Wart/Mole, Dryness, Hives, Jaundice, New Lesions, Non-Healing Wounds, Rash and Ulcer. HEENT Present- Wears glasses/contact lenses. Not Present- Earache, Hearing Loss, Hoarseness, Nose Bleed, Oral Ulcers, Ringing in the Ears, Seasonal Allergies, Sinus Pain, Sore Throat, Visual Disturbances and Yellow Eyes. Respiratory Not Present- Bloody sputum, Chronic Cough, Difficulty Breathing, Snoring and Wheezing. Breast Not Present- Breast Mass, Breast Pain, Nipple Discharge and Skin Changes. Cardiovascular Present- Leg Cramps. Not Present- Chest Pain, Difficulty Breathing Lying Down, Palpitations, Rapid Heart Rate, Shortness of Breath and Swelling of Extremities. Gastrointestinal Present- Excessive gas. Not Present- Abdominal Pain, Bloating, Bloody Stool, Change in Bowel Habits, Chronic diarrhea, Constipation, Difficulty Swallowing, Gets full quickly at meals, Hemorrhoids, Indigestion, Nausea, Rectal Pain and Vomiting. Male Genitourinary Not Present- Blood in Urine, Change in Urinary Stream, Frequency, Impotence, Nocturia, Painful Urination, Urgency and Urine Leakage. Musculoskeletal Not Present- Back Pain, Joint Pain, Joint Stiffness, Muscle Pain, Muscle Weakness  and Swelling of Extremities. Neurological Not  Present- Decreased Memory, Fainting, Headaches, Numbness, Seizures, Tingling, Tremor, Trouble walking and Weakness. Psychiatric Not Present- Anxiety, Bipolar, Change in Sleep Pattern, Depression, Fearful and Frequent crying.  There were no vitals filed for this visit.    Physical Exam  The physical exam findings are as follows: Note:He is alert and oriented, well-appearing Anicteric, extra ocular motions intact Neck without mass or thyromegaly Moist mucous membranes, good dentition Unlabored respirations, symmetrical air entry Regular rate and rhythm, no pedal edema Abdomen is soft, nontender nondistended. There is no hernia or mass. No right upper quadrant tenderness but he can point with one finger to the location of the pain and it is in the right upper quadrant subcostal area. Extremities are warm and well perfused, no deformity Neuro grossly normal, normal gait Psych normal mood and affect, appropriate insight Skin no lesions or rashes on limited skin exam    Assessment & Plan CHOLELITHIASIS (K80.20) Story: He has cholelithiasis based on both recent MRI and ultrasound. His symptoms are unusual for gallbladder pain. I discussed with him that I'm not 100% confident that removing his gallbladder will alleviate the right upper quadrant pain that he has been having based on the circumstances in which it occurs. We discussed laparoscopic cholecystectomy, the risks of surgery including bleeding, infection, pain, scarring, intra-abdominal injury specifically the common bile duct and sequelae of that, as well as the possibility that gallbladder surgery will not relieve his symptoms. We also discussed the natural history of gallstones including but the vast majority of people will never have any symptoms from gallstones and that even fewer people with gallstones will experience a serious Condition such as cholecystitis, cholangitis or  pancreatitis. I describe each of those syndromes to him in detail and educated in his wife on signs and symptoms that should prompt concerning visit to the ER. Ultimately he decided to pursue surgery. OR today.

## 2017-05-29 NOTE — Anesthesia Procedure Notes (Signed)
Procedure Name: Intubation Date/Time: 05/29/2017 11:02 AM Performed by: Kyung Rudd Pre-anesthesia Checklist: Patient identified, Emergency Drugs available, Suction available and Patient being monitored Patient Re-evaluated:Patient Re-evaluated prior to inductionOxygen Delivery Method: Circle system utilized Preoxygenation: Pre-oxygenation with 100% oxygen Intubation Type: IV induction Ventilation: Mask ventilation without difficulty Laryngoscope Size: Mac and 3 Grade View: Grade II Tube type: Oral Tube size: 7.5 mm Number of attempts: 1 Airway Equipment and Method: Stylet Placement Confirmation: ETT inserted through vocal cords under direct vision,  positive ETCO2 and breath sounds checked- equal and bilateral Secured at: 22 cm Tube secured with: Tape Dental Injury: Teeth and Oropharynx as per pre-operative assessment

## 2017-05-29 NOTE — Anesthesia Postprocedure Evaluation (Signed)
Anesthesia Post Note  Patient: Ryan Crane  Procedure(s) Performed: Procedure(s) (LRB): LAPAROSCOPIC CHOLECYSTECTOMY (N/A)     Patient location during evaluation: PACU Anesthesia Type: General Level of consciousness: awake and alert Pain management: pain level controlled Vital Signs Assessment: post-procedure vital signs reviewed and stable Respiratory status: spontaneous breathing, nonlabored ventilation and respiratory function stable Cardiovascular status: blood pressure returned to baseline and stable Postop Assessment: no signs of nausea or vomiting Anesthetic complications: no    Last Vitals:  Vitals:   05/29/17 1215 05/29/17 1230  BP: 121/80 127/87  Pulse: (!) 57 (!) 57  Resp: 11 20  Temp:      Last Pain:  Vitals:   05/29/17 1205  TempSrc:   PainSc: 0-No pain                 [name]

## 2017-05-30 ENCOUNTER — Encounter (HOSPITAL_COMMUNITY): Payer: Self-pay | Admitting: Surgery

## 2017-06-04 ENCOUNTER — Ambulatory Visit (INDEPENDENT_AMBULATORY_CARE_PROVIDER_SITE_OTHER): Payer: 59 | Admitting: *Deleted

## 2017-06-04 DIAGNOSIS — E538 Deficiency of other specified B group vitamins: Secondary | ICD-10-CM | POA: Diagnosis not present

## 2017-06-04 MED ORDER — CYANOCOBALAMIN 1000 MCG/ML IJ SOLN
1000.0000 ug | Freq: Once | INTRAMUSCULAR | Status: AC
  Administered 2017-06-04: 1000 ug via INTRAMUSCULAR

## 2017-07-01 ENCOUNTER — Other Ambulatory Visit (INDEPENDENT_AMBULATORY_CARE_PROVIDER_SITE_OTHER): Payer: 59

## 2017-07-01 DIAGNOSIS — E119 Type 2 diabetes mellitus without complications: Secondary | ICD-10-CM | POA: Diagnosis not present

## 2017-07-01 DIAGNOSIS — E538 Deficiency of other specified B group vitamins: Secondary | ICD-10-CM

## 2017-07-01 LAB — HEMOGLOBIN A1C: Hgb A1c MFr Bld: 7 % — ABNORMAL HIGH (ref 4.6–6.5)

## 2017-07-01 LAB — VITAMIN B12: Vitamin B-12: 462 pg/mL (ref 211–911)

## 2017-07-09 ENCOUNTER — Encounter: Payer: Self-pay | Admitting: Family Medicine

## 2017-07-09 ENCOUNTER — Ambulatory Visit: Payer: 59

## 2017-07-09 ENCOUNTER — Ambulatory Visit (INDEPENDENT_AMBULATORY_CARE_PROVIDER_SITE_OTHER): Payer: 59 | Admitting: Family Medicine

## 2017-07-09 VITALS — BP 116/70 | HR 72 | Temp 97.5°F | Wt 162.2 lb

## 2017-07-09 DIAGNOSIS — R1011 Right upper quadrant pain: Secondary | ICD-10-CM | POA: Diagnosis not present

## 2017-07-09 DIAGNOSIS — E538 Deficiency of other specified B group vitamins: Secondary | ICD-10-CM

## 2017-07-09 DIAGNOSIS — E119 Type 2 diabetes mellitus without complications: Secondary | ICD-10-CM

## 2017-07-09 MED ORDER — CYANOCOBALAMIN 1000 MCG/ML IJ SOLN
1000.0000 ug | Freq: Once | INTRAMUSCULAR | Status: AC
  Administered 2017-07-09: 1000 ug via INTRAMUSCULAR

## 2017-07-09 NOTE — Progress Notes (Signed)
Diabetes:  Using medications without difficulties:yes Hypoglycemic episodes:no Hyperglycemic episodes:no Feet problems: see below.   Blood Sugars averaging: ~140 recently.   eye exam within last year:yes Some weight loss noted, more active this summer.  He feels well o/w.   Labs d/w pt. A1c at goal.   B12 wnl.  monthly injection.  Labs d/w pt.  Change in sensation on the tip of the tip of the toes on the L foot.  Present for about 1.5 months.  Intermittent, not consistent.  No R foot sx.  D/w pt about monitoring and foot care.    Still with some occ RUQ pain.  No sx in the last 3 weeks.  Was putting on his shoes when it happened, unclear if this was a muscle spasm, ie compression of the abd wall with leaning forward.  No blood in stool, no vomiting.  Appetite is good.  No jaundice.   Meds, vitals, and allergies reviewed.   ROS: Per HPI unless specifically indicated in ROS section   GEN: nad, alert and oriented HEENT: mucous membranes moist NECK: supple w/o LA CV: rrr. PULM: ctab, no inc wob ABD: soft, +bs EXT: no edema SKIN: no acute rash  Diabetic foot exam: Normal inspection No skin breakdown No calluses  Normal DP pulses Normal sensation to light touch and monofilament Nails normal

## 2017-07-09 NOTE — Patient Instructions (Signed)
Recheck labs in about 4 months, prior to a visit.   Keep going as is.  Update me as needed.  Take care.  Glad to see you.

## 2017-07-11 NOTE — Assessment & Plan Note (Signed)
No symptoms in the last 3 weeks. Would observe. He agrees. Abdomen benign today.

## 2017-07-11 NOTE — Assessment & Plan Note (Addendum)
A1c controlled. Sugar log reviewed. CT scan records. Continue as is. Recheck in a few months. Labs discussed with patient. Discussed footcare with patient. He will monitor episodic tingling he is noted in the left toes.

## 2017-07-11 NOTE — Assessment & Plan Note (Signed)
Controlled. Continue as is. Labs discussed with patient.

## 2017-07-15 ENCOUNTER — Telehealth: Payer: Self-pay

## 2017-07-15 NOTE — Telephone Encounter (Signed)
Ryan Crane wants the [facility] removed. Done as requested and Ryan Cristobal voiced understanding.

## 2017-08-05 DIAGNOSIS — E119 Type 2 diabetes mellitus without complications: Secondary | ICD-10-CM | POA: Diagnosis not present

## 2017-08-05 LAB — HM DIABETES EYE EXAM

## 2017-08-13 ENCOUNTER — Ambulatory Visit (INDEPENDENT_AMBULATORY_CARE_PROVIDER_SITE_OTHER): Payer: 59

## 2017-08-13 DIAGNOSIS — E538 Deficiency of other specified B group vitamins: Secondary | ICD-10-CM

## 2017-08-13 MED ORDER — CYANOCOBALAMIN 1000 MCG/ML IJ SOLN
1000.0000 ug | Freq: Once | INTRAMUSCULAR | Status: AC
  Administered 2017-08-13: 1000 ug via INTRAMUSCULAR

## 2017-08-15 ENCOUNTER — Encounter: Payer: Self-pay | Admitting: Family Medicine

## 2017-08-19 ENCOUNTER — Other Ambulatory Visit: Payer: Self-pay

## 2017-08-19 NOTE — Telephone Encounter (Signed)
pts wife (DPR signed) said pt has been taking hydrocodone apap after GB surgery and is no longer taking the hydrocodone apap and pt request rx oxycodone apap in case gets kidney stone will have on hand. Call when ready for pick up. Last refilled # 30 on 08/27/16. Pt last seen 07/09/17.

## 2017-08-20 MED ORDER — OXYCODONE-ACETAMINOPHEN 5-325 MG PO TABS: 2.0000 | ORAL_TABLET | Freq: Four times a day (QID) | ORAL | 0 refills | Status: DC | PRN

## 2017-08-20 NOTE — Telephone Encounter (Signed)
Left detailed message on voicemail. Rx left at front desk for pick up.  

## 2017-08-20 NOTE — Telephone Encounter (Signed)
Printed.  Thanks.  

## 2017-09-17 ENCOUNTER — Ambulatory Visit (INDEPENDENT_AMBULATORY_CARE_PROVIDER_SITE_OTHER): Payer: 59

## 2017-09-17 DIAGNOSIS — E538 Deficiency of other specified B group vitamins: Secondary | ICD-10-CM

## 2017-09-17 MED ORDER — CYANOCOBALAMIN 1000 MCG/ML IJ SOLN
1000.0000 ug | Freq: Once | INTRAMUSCULAR | Status: AC
  Administered 2017-09-17: 1000 ug via INTRAMUSCULAR

## 2017-09-22 NOTE — Progress Notes (Signed)
Agree. Thanks

## 2017-09-27 ENCOUNTER — Ambulatory Visit (INDEPENDENT_AMBULATORY_CARE_PROVIDER_SITE_OTHER): Payer: 59 | Admitting: Family Medicine

## 2017-09-27 ENCOUNTER — Encounter: Payer: Self-pay | Admitting: Family Medicine

## 2017-09-27 VITALS — BP 126/82 | HR 65 | Temp 98.0°F | Ht 65.0 in | Wt 161.4 lb

## 2017-09-27 DIAGNOSIS — R634 Abnormal weight loss: Secondary | ICD-10-CM | POA: Diagnosis not present

## 2017-09-27 DIAGNOSIS — E119 Type 2 diabetes mellitus without complications: Secondary | ICD-10-CM

## 2017-09-27 NOTE — Progress Notes (Signed)
Weight recently 165-167.  Lower this summer and today.  No FCNAVD.  He doesn't feel unwell or sick.  No night sweats.  Sleeping well.  No blood in stool.  He had some dark but not black/tarry stools, and that resolved after a few days, back to normal.    Flu shot done 09/13/17.   Diabetes:  Using medications without difficulties:yes Hypoglycemic episodes:rare, if prolonged fasting.  Hyperglycemic episodes:no Feet problems:no Blood Sugars averaging:  Slightly higher recently, ~150s which is atypical.  eye exam within last year: yes  Meds, vitals, and allergies reviewed.   ROS: Per HPI unless specifically indicated in ROS section   GEN: nad, alert and oriented HEENT: mucous membranes moist NECK: supple w/o LA CV: rrr. PULM: ctab, no inc wob ABD: soft, +bs EXT: no edema

## 2017-09-27 NOTE — Patient Instructions (Signed)
Go to the lab on the way out.  We'll contact you with your lab report. Take care.  Glad to see you.  We'll go from there.

## 2017-09-28 LAB — CBC WITH DIFFERENTIAL/PLATELET
BASOS ABS: 38 {cells}/uL (ref 0–200)
Basophils Relative: 0.7 %
EOS ABS: 221 {cells}/uL (ref 15–500)
Eosinophils Relative: 4.1 %
HCT: 41.7 % (ref 38.5–50.0)
Hemoglobin: 14.1 g/dL (ref 13.2–17.1)
Lymphs Abs: 1426 cells/uL (ref 850–3900)
MCH: 31.1 pg (ref 27.0–33.0)
MCHC: 33.8 g/dL (ref 32.0–36.0)
MCV: 92.1 fL (ref 80.0–100.0)
MONOS PCT: 9.7 %
MPV: 11.1 fL (ref 7.5–12.5)
NEUTROS PCT: 59.1 %
Neutro Abs: 3191 cells/uL (ref 1500–7800)
PLATELETS: 199 10*3/uL (ref 140–400)
RBC: 4.53 10*6/uL (ref 4.20–5.80)
RDW: 12.4 % (ref 11.0–15.0)
TOTAL LYMPHOCYTE: 26.4 %
WBC: 5.4 10*3/uL (ref 3.8–10.8)
WBCMIX: 524 {cells}/uL (ref 200–950)

## 2017-09-28 LAB — COMPREHENSIVE METABOLIC PANEL
AG Ratio: 1.9 (calc) (ref 1.0–2.5)
ALKALINE PHOSPHATASE (APISO): 89 U/L (ref 40–115)
ALT: 22 U/L (ref 9–46)
AST: 26 U/L (ref 10–35)
Albumin: 4.5 g/dL (ref 3.6–5.1)
BUN: 18 mg/dL (ref 7–25)
CHLORIDE: 102 mmol/L (ref 98–110)
CO2: 28 mmol/L (ref 20–32)
CREATININE: 0.99 mg/dL (ref 0.70–1.25)
Calcium: 9.8 mg/dL (ref 8.6–10.3)
GLOBULIN: 2.4 g/dL (ref 1.9–3.7)
GLUCOSE: 124 mg/dL — AB (ref 65–99)
Potassium: 4.6 mmol/L (ref 3.5–5.3)
Sodium: 139 mmol/L (ref 135–146)
Total Bilirubin: 0.6 mg/dL (ref 0.2–1.2)
Total Protein: 6.9 g/dL (ref 6.1–8.1)

## 2017-09-28 LAB — HEMOGLOBIN A1C
EAG (MMOL/L): 8.9 (calc)
HEMOGLOBIN A1C: 7.2 %{Hb} — AB (ref <5.7)
MEAN PLASMA GLUCOSE: 160 (calc)

## 2017-09-28 LAB — FERRITIN: FERRITIN: 20 ng/mL (ref 20–380)

## 2017-09-28 LAB — TSH: TSH: 0.51 m[IU]/L (ref 0.40–4.50)

## 2017-09-29 NOTE — Assessment & Plan Note (Signed)
With recent weight loss noted but with some higher sugars paradoxically noted. Discussed with patient. Reasonable to recheck routine labs today. No ominous symptoms. Okay for outpatient follow-up. He did have some dark stools prior they were not black or tarry. Those symptoms have resolved in the meantime.  See notes on labs.

## 2017-09-30 ENCOUNTER — Encounter: Payer: Self-pay | Admitting: *Deleted

## 2017-10-21 ENCOUNTER — Other Ambulatory Visit: Payer: Self-pay | Admitting: Family Medicine

## 2017-10-21 DIAGNOSIS — E538 Deficiency of other specified B group vitamins: Secondary | ICD-10-CM

## 2017-10-22 ENCOUNTER — Other Ambulatory Visit (INDEPENDENT_AMBULATORY_CARE_PROVIDER_SITE_OTHER): Payer: 59

## 2017-10-22 ENCOUNTER — Ambulatory Visit (INDEPENDENT_AMBULATORY_CARE_PROVIDER_SITE_OTHER): Payer: 59

## 2017-10-22 DIAGNOSIS — E538 Deficiency of other specified B group vitamins: Secondary | ICD-10-CM

## 2017-10-22 LAB — VITAMIN B12: Vitamin B-12: 472 pg/mL (ref 211–911)

## 2017-10-22 MED ORDER — CYANOCOBALAMIN 1000 MCG/ML IJ SOLN
1000.0000 ug | Freq: Once | INTRAMUSCULAR | Status: AC
  Administered 2017-10-22: 1000 ug via INTRAMUSCULAR

## 2017-10-25 ENCOUNTER — Ambulatory Visit: Payer: 59 | Admitting: Family Medicine

## 2017-10-25 ENCOUNTER — Encounter: Payer: Self-pay | Admitting: Family Medicine

## 2017-10-25 VITALS — BP 132/76 | HR 67 | Temp 97.9°F | Wt 161.0 lb

## 2017-10-25 DIAGNOSIS — J209 Acute bronchitis, unspecified: Secondary | ICD-10-CM | POA: Diagnosis not present

## 2017-10-25 MED ORDER — GUAIFENESIN-CODEINE 100-10 MG/5ML PO SYRP: 5.0000 mL | ORAL_SOLUTION | Freq: Two times a day (BID) | ORAL | 0 refills | Status: DC | PRN

## 2017-10-25 NOTE — Progress Notes (Signed)
BP 132/76 (BP Location: Left Arm, Patient Position: Sitting, Cuff Size: Normal)   Pulse 67   Temp 97.9 F (36.6 C) (Oral)   Wt 161 lb (73 kg)   SpO2 98%   BMI 26.79 kg/m    CC: cough/sinus drainage Subjective:    Patient ID: Ryan Crane, male    DOB: 1956/08/27, 60 y.o.   MRN: [id number]  HPI: Ryan Crane is a 61 y.o. male presenting on 10/25/2017 for Cough (Started 10/19/17. Used inhaler and Flonase) and Sinus Problem (having some drainage. Denies sore throat)   1 wk h/o sinus drainage, chest>head congestion, productive cough. + wheezing.   No fevers/chills, ear or tooth pain, ST, HA, dyspnea.   Has tried albuterol inhaler and nasal spray, throat lozenges and tylenol.   No sick contacts at home No h/o asthma Non smoker. Known diabetic Lab Results  Component Value Date   HGBA1C 7.2 (H) 09/27/2017     Relevant past medical, surgical, family and social history reviewed and updated as indicated. Interim medical history since our last visit reviewed. Allergies and medications reviewed and updated. Outpatient Medications Prior to Visit  Medication Sig Dispense Refill  . aspirin 81 MG tablet Take 81 mg by mouth daily.      . cyanocobalamin (,VITAMIN B-12,) 1000 MCG/ML injection Inject 1,000 mcg into the muscle every 30 (thirty) days.    . fenofibrate 160 MG tablet TAKE 1 TABLET (160 MG TOTAL) BY MOUTH DAILY. 30 tablet 12  . fluticasone (FLONASE) 50 MCG/ACT nasal spray Place 1 spray into both nostrils daily.    Marland Kitchen lisinopril (PRINIVIL,ZESTRIL) 20 MG tablet TAKE 1 TABLET (20 MG TOTAL) BY MOUTH DAILY. 30 tablet 12  . metFORMIN (GLUCOPHAGE) 500 MG tablet Take 2 tablets (1,000 mg total) by mouth 2 (two) times daily with a meal. 120 tablet 12  . omeprazole (PRILOSEC) 40 MG capsule TAKE 1 CAPSULE (40 MG TOTAL) BY MOUTH DAILY. 30 capsule 12  . ONETOUCH DELICA LANCETS 41Y MISC USE TO TEST BLOOD SUGARS AS DIRECTED 100 each 2  . ONETOUCH VERIO test strip USE TO TEST BLOOD SUGARS  AS DIRECTED 100 each 2  . oxyCODONE-acetaminophen (PERCOCET/ROXICET) 5-325 MG tablet Take 2 tablets by mouth every 6 (six) hours as needed (for kidney stones). 30 tablet 0  . SYNTHROID 175 MCG tablet TAKE 1 TABLET (175 MCG TOTAL) BY MOUTH DAILY. 30 tablet 0  . VENTOLIN HFA 108 (90 Base) MCG/ACT inhaler INHALE 1-2 PUFFS INTO THE LUNGS EVERY 6 (SIX) HOURS AS NEEDED FOR WHEEZING OR SHORTNESS OF BREATH. 18 Inhaler 1   No facility-administered medications prior to visit.      Per HPI unless specifically indicated in ROS section below Review of Systems     Objective:    BP 132/76 (BP Location: Left Arm, Patient Position: Sitting, Cuff Size: Normal)   Pulse 67   Temp 97.9 F (36.6 C) (Oral)   Wt 161 lb (73 kg)   SpO2 98%   BMI 26.79 kg/m   Wt Readings from Last 3 Encounters:  10/25/17 161 lb (73 kg)  09/27/17 161 lb 6.4 oz (73.2 kg)  07/09/17 162 lb 4 oz (73.6 kg)    Physical Exam  Constitutional: He appears well-developed and well-nourished. No distress.  Hoarse voice  HENT:  Head: Normocephalic and atraumatic.  Right Ear: Hearing, tympanic membrane, external ear and ear canal normal.  Left Ear: Hearing, tympanic membrane, external ear and ear canal normal.  Nose: Mucosal edema (nasal mucosal inflammation  and congestion) present. No rhinorrhea. Right sinus exhibits no maxillary sinus tenderness and no frontal sinus tenderness. Left sinus exhibits no maxillary sinus tenderness and no frontal sinus tenderness.  Mouth/Throat: Uvula is midline, oropharynx is clear and moist and mucous membranes are normal. No oropharyngeal exudate, posterior oropharyngeal edema, posterior oropharyngeal erythema or tonsillar abscesses.  Eyes: Conjunctivae and EOM are normal. Pupils are equal, round, and reactive to light. No scleral icterus.  Neck: Normal range of motion. Neck supple.  Cardiovascular: Normal rate, regular rhythm, normal heart sounds and intact distal pulses.  No murmur  heard. Pulmonary/Chest: Effort normal and breath sounds normal. No respiratory distress. He has no wheezes. He has no rales.  Lungs clear  Lymphadenopathy:    He has no cervical adenopathy.  Skin: Skin is warm and dry. No rash noted.  Nursing note and vitals reviewed.  Lab Results  Component Value Date   CREATININE 0.99 09/27/2017       Assessment & Plan:   Problem List Items Addressed This Visit    Acute bronchitis - Primary    Anticipate viral given short duration. Supportive care discussed. cheratussin for cough suppression at night time. Discussed ibuprofen use.  Discussed red flags suggestive of bacterial infection.  Has f/u with PCP next week.           Follow up plan: Return if symptoms worsen or fail to improve.  Ria Bush, MD

## 2017-10-25 NOTE — Assessment & Plan Note (Addendum)
Anticipate viral given short duration. Supportive care discussed. cheratussin for cough suppression at night time. Discussed ibuprofen use.  Discussed red flags suggestive of bacterial infection.  Has f/u with PCP next week.

## 2017-10-25 NOTE — Patient Instructions (Addendum)
I think you have bronchitis, likely viral.  Antibiotics are not needed for this.  Viral infections usually take 7-10 days to resolve.  The cough can last a few weeks to go away. Use medication as prescribed: cheratussin cough syrup for night time Push fluids and plenty of rest. Please return if you are not improving as expected, or if you have high fevers (>101.5) or difficulty swallowing or worsening productive cough.  Call clinic with questions.  Good to see you today. I hope you start feeling better soon.

## 2017-10-28 ENCOUNTER — Encounter: Payer: Self-pay | Admitting: Family Medicine

## 2017-10-28 ENCOUNTER — Ambulatory Visit (INDEPENDENT_AMBULATORY_CARE_PROVIDER_SITE_OTHER): Payer: 59 | Admitting: Family Medicine

## 2017-10-28 DIAGNOSIS — J209 Acute bronchitis, unspecified: Secondary | ICD-10-CM | POA: Diagnosis not present

## 2017-10-28 DIAGNOSIS — E119 Type 2 diabetes mellitus without complications: Secondary | ICD-10-CM

## 2017-10-28 DIAGNOSIS — M25521 Pain in right elbow: Secondary | ICD-10-CM | POA: Diagnosis not present

## 2017-10-28 MED ORDER — OMEPRAZOLE 40 MG PO CPDR: DELAYED_RELEASE_CAPSULE | ORAL | Status: DC

## 2017-10-28 MED ORDER — ALBUTEROL SULFATE HFA 108 (90 BASE) MCG/ACT IN AERS: INHALATION_SPRAY | RESPIRATORY_TRACT | 3 refills | Status: DC

## 2017-10-28 NOTE — Progress Notes (Signed)
URI sx.  duration of symptoms: ~10 days rhinorrhea:yes congestion:yes ear pain: no sore throat: no Cough: yes myalgias:no Voice is still altered with cough but "I'm on the tail end of it."  He is getting some better but some of the cough remains.    B12.  Monthly injection, no ADE on med.  Compliant.  B12 wnl.    DM.  Still on metformin 1000mg  BID.  No ADE on med.  His sugar has been ~150 recently, higher while sick but upward trending prior.    Weight is up some in the meantime, isn't continuing to drop.  His overall portion size is lower compared to historical levels.  He doesn't feel sick o/w, except for the recent URI. He felt well enough to change the hot water tank at his house.  He has worked on diet, had less GERD sx and cut back on PPI to 1-2 times per week (and healthier diet may explain some of the weight loss).  R lateral elbow pain with ROM but not puffy.  Happened years ago, resolved until recently.    Per HPI unless specifically indicated in ROS section   Meds, vitals, and allergies reviewed.   GEN: nad, alert and oriented HEENT: mucous membranes moist, TM w/o erythema, nasal epithelium mild injected, OP without cobblestoning NECK: supple w/o LA CV: rrr. PULM: ctab, no inc wob ABD: soft, +bs EXT: no edema R elbow tender near the lateral olecranon but not painful on testing for tennis elbow.  No redness.  No swelling.  Normal motor and sensation exam.

## 2017-10-28 NOTE — Patient Instructions (Addendum)
Keep monitoring your weight and sugar as you have been.  Ice your elbow- 5 min on and 5 min off.   Ibuprofen 200-400mg  up to 3 times a day with food.  Take care.  Glad to see you.  Update me as needed.

## 2017-10-31 NOTE — Assessment & Plan Note (Addendum)
No change in meds.  He'll monitor weight and sugar and update me as needed.  If next a1c sig higher the consider med change but not today, pt agrees.   Okay for outpatient f/u.

## 2017-10-31 NOTE — Assessment & Plan Note (Signed)
Looks to be improving.  Continue as is.  Nontoxic.

## 2017-10-31 NOTE — Assessment & Plan Note (Signed)
Ice the elbow- 5 min on and 5 min off.   Ibuprofen 200-400mg  up to 3 times a day with food.  Update me as needed.  He agrees.

## 2017-11-26 ENCOUNTER — Ambulatory Visit (INDEPENDENT_AMBULATORY_CARE_PROVIDER_SITE_OTHER): Payer: 59

## 2017-11-26 DIAGNOSIS — E538 Deficiency of other specified B group vitamins: Secondary | ICD-10-CM

## 2017-11-26 MED ORDER — CYANOCOBALAMIN 1000 MCG/ML IJ SOLN
1000.0000 ug | Freq: Once | INTRAMUSCULAR | Status: AC
  Administered 2017-11-26: 1000 ug via INTRAMUSCULAR

## 2017-11-28 ENCOUNTER — Other Ambulatory Visit: Payer: Self-pay | Admitting: Family Medicine

## 2017-11-29 NOTE — Progress Notes (Signed)
Agree. Thanks

## 2017-12-02 ENCOUNTER — Other Ambulatory Visit: Payer: Self-pay | Admitting: Family Medicine

## 2017-12-02 NOTE — Telephone Encounter (Signed)
Copied from [place]. Topic: Inquiry >> Dec 02, 2017  1:11 PM Pricilla Handler wrote: Reason for CRM: Patient's One Touch Verio has stopped working. Patient has requested a prescription for a new Blood Glucose device. Please call patient ASAP.       Thank You!!!

## 2017-12-02 NOTE — Telephone Encounter (Signed)
Pt requesting prescription for a new Blood Glucose device due to current one no longer working.

## 2017-12-04 ENCOUNTER — Telehealth: Payer: Self-pay | Admitting: *Deleted

## 2017-12-04 MED ORDER — ONETOUCH VERIO W/DEVICE KIT: 1.0000 | PACK | Freq: Every day | 0 refills | Status: DC

## 2017-12-04 NOTE — Telephone Encounter (Signed)
Glucose device was refilled as requested per Margarite Gouge CMA.

## 2017-12-04 NOTE — Telephone Encounter (Signed)
Spoke to pt and advised new kit sent to pharmacy

## 2017-12-04 NOTE — Telephone Encounter (Signed)
Copied from [place]. Topic: Inquiry >> Dec 02, 2017  1:11 PM Ryan Crane wrote: Reason for CRM: Patient's One Touch Verio has stopped working. Patient has requested a prescription for a new Blood Glucose device. Please call patient ASAP.       Thank You!!!

## 2017-12-20 ENCOUNTER — Telehealth: Payer: Self-pay | Admitting: Family Medicine

## 2017-12-20 NOTE — Telephone Encounter (Signed)
Placed in Dr. Duncan's In Box. 

## 2017-12-20 NOTE — Telephone Encounter (Signed)
Pt dropped off his A1C log stating Damita Dunnings needs to review the log pt states readings are high. Placed on provider cart at front checkin area.

## 2017-12-22 MED ORDER — GLIPIZIDE 5 MG PO TABS: 2.5000 mg | ORAL_TABLET | Freq: Every day | ORAL | 5 refills | Status: DC

## 2017-12-22 NOTE — Telephone Encounter (Signed)
Scanned the form.  With the recent high sugars and his baseline attention to diet, I would add on glipizide.  Start with 2.5mg  in the AM with breakfast- skip med if fasting and not eating breakfast.  I realize he had nausea with flomax but he may be able to tolerate glipizide.  If needed, can inc to 5mg  in the AM.  Update me as needed.  Thanks.  rx sent.

## 2017-12-23 NOTE — Telephone Encounter (Signed)
Patient advised.

## 2017-12-31 ENCOUNTER — Ambulatory Visit (INDEPENDENT_AMBULATORY_CARE_PROVIDER_SITE_OTHER): Payer: 59

## 2017-12-31 ENCOUNTER — Telehealth: Payer: Self-pay | Admitting: *Deleted

## 2017-12-31 DIAGNOSIS — E538 Deficiency of other specified B group vitamins: Secondary | ICD-10-CM

## 2017-12-31 MED ORDER — CYANOCOBALAMIN 1000 MCG/ML IJ SOLN
1000.0000 ug | Freq: Once | INTRAMUSCULAR | Status: AC
  Administered 2017-12-31: 1000 ug via INTRAMUSCULAR

## 2017-12-31 NOTE — Telephone Encounter (Signed)
Patient dropped off blood sugar readings, placed in Dr. Josefine [facility] [place].

## 2018-01-01 NOTE — Telephone Encounter (Signed)
I'll check the hard copy. Thanks.  

## 2018-01-02 MED ORDER — GLIPIZIDE 5 MG PO TABS
ORAL_TABLET | ORAL | Status: DC
Start: 2018-01-02 — End: 2018-01-10

## 2018-01-02 NOTE — Telephone Encounter (Signed)
Notify pt.  I saw the records on his sugars.  Thanks for the update.   I would try increasing the total dose of glipizide to 5mg  in the AM and 2.5 (and then 5mg  a few days later if needed) in the PM and update me in a few days.   Use the pills he has and we can send a new rx when his dose stabilizes.  Thanks.

## 2018-01-02 NOTE — Addendum Note (Signed)
Addended by: Tonia Ghent on: 01/02/2018 09:14 AM   Modules accepted: Orders

## 2018-01-02 NOTE — Telephone Encounter (Addendum)
Patient advised and understands instructions.  

## 2018-01-10 ENCOUNTER — Telehealth: Payer: Self-pay | Admitting: Family Medicine

## 2018-01-10 ENCOUNTER — Other Ambulatory Visit: Payer: Self-pay

## 2018-01-10 MED ORDER — GLIPIZIDE 5 MG PO TABS: ORAL_TABLET | ORAL | 0 refills | Status: DC

## 2018-01-10 NOTE — Telephone Encounter (Signed)
Copied from [facility]. Topic: Quick Communication - Rx Refill/Question >> Jan 10, 2018  9:21 AM Arletha Grippe wrote: Medication: glipiZIDE (GLUCOTROL) 5 MG tablet   Has the patient contacted their pharmacy? No.   (Agent: If no, request that the patient contact the pharmacy for the refill.)   Preferred Pharmacy (with phone number or street name): [facility] [address] [place]  Pt has had change in dosage    Agent: Please be advised that RX refills may take up to 3 business days. We ask that you follow-up with your pharmacy.

## 2018-01-13 ENCOUNTER — Telehealth: Payer: Self-pay | Admitting: Family Medicine

## 2018-01-13 NOTE — Telephone Encounter (Signed)
Placed in Dr. Duncan's In Box. 

## 2018-01-13 NOTE — Telephone Encounter (Signed)
Pt dropped off blood sugar numbers. Requesting to have them looked at and find a new thyroid doctor. Placed on cart

## 2018-01-16 ENCOUNTER — Other Ambulatory Visit: Payer: Self-pay | Admitting: Family Medicine

## 2018-01-16 DIAGNOSIS — E119 Type 2 diabetes mellitus without complications: Secondary | ICD-10-CM

## 2018-01-16 DIAGNOSIS — E0500 Thyrotoxicosis with diffuse goiter without thyrotoxic crisis or storm: Secondary | ICD-10-CM

## 2018-01-16 MED ORDER — SITAGLIPTIN PHOSPHATE 100 MG PO TABS: 50.0000 mg | ORAL_TABLET | Freq: Every day | ORAL | 1 refills | Status: DC

## 2018-01-16 NOTE — Progress Notes (Signed)
Call pt.  I saw his sugar log.  Would add on 50mg  januvia- that's 1/2 tab of the 100mg .  rx sent.  Continue other meds.  Please have him come in for labs when possible, I ordered extra labs re: his elevated sugars.   Have him check sugar in the AM and later in the day so we can get an idea about his readings later in the day.   I put in referral re: endo about his thyroid. We may need their input re: DM2, too.  Thanks.  [id number] [place], [place].   Patient advised.  01/16/2018 L. [place], CMA

## 2018-01-20 ENCOUNTER — Telehealth: Payer: Self-pay | Admitting: *Deleted

## 2018-01-20 ENCOUNTER — Other Ambulatory Visit (INDEPENDENT_AMBULATORY_CARE_PROVIDER_SITE_OTHER): Payer: 59

## 2018-01-20 DIAGNOSIS — E119 Type 2 diabetes mellitus without complications: Secondary | ICD-10-CM

## 2018-01-20 DIAGNOSIS — R7989 Other specified abnormal findings of blood chemistry: Secondary | ICD-10-CM

## 2018-01-20 DIAGNOSIS — E538 Deficiency of other specified B group vitamins: Secondary | ICD-10-CM

## 2018-01-20 LAB — CBC WITH DIFFERENTIAL/PLATELET
BASOS PCT: 0.6 % (ref 0.0–3.0)
Basophils Absolute: 0 10*3/uL (ref 0.0–0.1)
Eosinophils Absolute: 0.1 10*3/uL (ref 0.0–0.7)
Eosinophils Relative: 3.2 % (ref 0.0–5.0)
HCT: 44.3 % (ref 39.0–52.0)
Hemoglobin: 15.3 g/dL (ref 13.0–17.0)
LYMPHS ABS: 1 10*3/uL (ref 0.7–4.0)
Lymphocytes Relative: 22 % (ref 12.0–46.0)
MCHC: 34.5 g/dL (ref 30.0–36.0)
MCV: 90.9 fl (ref 78.0–100.0)
MONOS PCT: 12.3 % — AB (ref 3.0–12.0)
Monocytes Absolute: 0.6 10*3/uL (ref 0.1–1.0)
NEUTROS ABS: 2.9 10*3/uL (ref 1.4–7.7)
NEUTROS PCT: 61.9 % (ref 43.0–77.0)
PLATELETS: 204 10*3/uL (ref 150.0–400.0)
RBC: 4.87 Mil/uL (ref 4.22–5.81)
RDW: 13.5 % (ref 11.5–15.5)
WBC: 4.6 10*3/uL (ref 4.0–10.5)

## 2018-01-20 LAB — BASIC METABOLIC PANEL
BUN: 17 mg/dL (ref 6–23)
CO2: 27 meq/L (ref 19–32)
Calcium: 9.4 mg/dL (ref 8.4–10.5)
Chloride: 100 mEq/L (ref 96–112)
Creatinine, Ser: 1.06 mg/dL (ref 0.40–1.50)
GFR: 74.91 mL/min (ref >60.00)
Glucose, Bld: 219 mg/dL — ABNORMAL HIGH (ref 70–99)
Potassium: 4.2 mEq/L (ref 3.5–5.1)
Sodium: 137 mEq/L (ref 135–145)

## 2018-01-20 LAB — HEMOGLOBIN A1C: Hgb A1c MFr Bld: 10.1 % — ABNORMAL HIGH (ref 4.6–6.5)

## 2018-01-20 LAB — TSH: TSH: 0.12 u[IU]/mL — AB (ref 0.35–4.50)

## 2018-01-20 LAB — VITAMIN B12: Vitamin B-12: 875 pg/mL (ref 211–911)

## 2018-01-20 LAB — FERRITIN: Ferritin: 18.1 ng/mL — ABNORMAL LOW (ref 22.0–322.0)

## 2018-01-20 NOTE — Telephone Encounter (Signed)
Pt came in for labs today and dropped off BP record (placed in Rx tower) and pt has questions concerning med refills and recent weight loss.

## 2018-01-21 LAB — C-PEPTIDE: C PEPTIDE: 2.97 ng/mL (ref 0.80–3.85)

## 2018-01-22 ENCOUNTER — Other Ambulatory Visit: Payer: Self-pay | Admitting: Family Medicine

## 2018-01-22 DIAGNOSIS — E0500 Thyrotoxicosis with diffuse goiter without thyrotoxic crisis or storm: Secondary | ICD-10-CM

## 2018-01-22 MED ORDER — GLIPIZIDE 5 MG PO TABS: ORAL_TABLET | ORAL | 3 refills | Status: DC

## 2018-01-22 MED ORDER — LEVOTHYROXINE SODIUM 175 MCG PO TABS: ORAL_TABLET | ORAL | Status: DC

## 2018-01-27 ENCOUNTER — Encounter: Payer: Self-pay | Admitting: Endocrinology

## 2018-01-27 ENCOUNTER — Ambulatory Visit (INDEPENDENT_AMBULATORY_CARE_PROVIDER_SITE_OTHER): Payer: 59 | Admitting: Endocrinology

## 2018-01-27 VITALS — BP 108/78 | HR 73 | Wt 152.2 lb

## 2018-01-27 DIAGNOSIS — E039 Hypothyroidism, unspecified: Secondary | ICD-10-CM | POA: Diagnosis not present

## 2018-01-27 DIAGNOSIS — E119 Type 2 diabetes mellitus without complications: Secondary | ICD-10-CM

## 2018-01-27 MED ORDER — LEVOTHYROXINE SODIUM 150 MCG PO TABS: 150.0000 ug | ORAL_TABLET | Freq: Every day | ORAL | 11 refills | Status: DC

## 2018-01-27 NOTE — Patient Instructions (Addendum)
I have sent a prescription to your pharmacy, for levothyroxine, 150 mcg/day. Please come back for a follow-up appointment in 2 days.

## 2018-01-27 NOTE — Progress Notes (Signed)
Subjective:    Patient ID: Ryan Crane, male    DOB: 1956/02/19, 62 y.o.   MRN: [id number]  HPI Pt is referred by Dr Damita Dunnings, for hypothyroidism and DM.  Pt reports hyperthyroidism was dx'ed in 2012.  He had RAI rx in 2013.  has been on prescribed thyroid hormone therapy since then.  He has never taken kelp or any other type of non-prescribed thyroid product. He has never had thyroid surgery, or XRT to the neck.  He has never been on amiodarone or lithium.  He has been on 175 mcg/d x 3 years, until he was advised last week ago. to take just 1/2 tab per day on Sunday.  He has slight weight loss (10 lbs), x 3 months, and assoc slight diffuse muscle weakness.   Past Medical History:  Diagnosis Date  . Allergy   . Arthritis   . Cholelithiasis    symptomatic  . Diabetes mellitus ([place])   . Family history of malignant neoplasm of gastrointestinal tract   . Fatty liver   . GERD (gastroesophageal reflux disease)   . Graves disease    s/p ablation  . Hemochromatosis   . History of chicken pox   . History of hiatal hernia   . History of kidney stones   . Hyperlipidemia   . Hypertension   . Internal hemorrhoids   . Kidney stones    . Personal history of colonic polyps 03/31/2008   tubular adenoma  . Right fibular fracture 2013    Past Surgical History:  Procedure Laterality Date  . CHOLECYSTECTOMY N/A 05/29/2017   Procedure: LAPAROSCOPIC CHOLECYSTECTOMY;  Surgeon: Clovis Riley, MD;  Location: [place];  Service: General;  Laterality: N/A;  . COLONOSCOPY W/ BIOPSIES AND POLYPECTOMY    . LITHOTRIPSY    . URETERAL STENT PLACEMENT     left    Social History   Socioeconomic History  . Marital status: Married    Spouse name: Not on file  . Number of children: 1  . Years of education: Not on file  . Highest education level: Not on file  Social Needs  . Financial resource strain: Not on file  . Food insecurity - worry: Not on file  . Food insecurity - inability: Not on file  .  Transportation needs - medical: Not on file  . Transportation needs - non-medical: Not on file  Occupational History  . Occupation: Hydrologist  . Occupation: PROJECT Health visitor: [employer]  Tobacco Use  . Smoking status: Never Smoker  . Smokeless tobacco: Never Used  Substance and Sexual Activity  . Alcohol use: No    Alcohol/week: 0.0 oz  . Drug use: No  . Sexual activity: Yes  Other Topics Concern  . Not on file  Social History Narrative   2 years of college   Manager at [employer]   Married 1977   1 daughter    Current Outpatient Medications on File Prior to Visit  Medication Sig Dispense Refill  . albuterol (VENTOLIN HFA) 108 (90 Base) MCG/ACT inhaler INHALE 1-2 PUFFS INTO THE LUNGS EVERY 6 (SIX) HOURS AS NEEDED FOR WHEEZING OR SHORTNESS OF BREATH. 18 Inhaler 3  . aspirin 81 MG tablet Take 81 mg by mouth daily.      . Blood Glucose Monitoring Suppl (ONETOUCH VERIO) w/Device KIT 1 kit by Does not apply route daily. Use to test blood sugar once daily Dx E11.9  1 kit 0  . cyanocobalamin (,VITAMIN B-12,) 1000 MCG/ML injection Inject 1,000 mcg into the muscle every 30 (thirty) days.    . fenofibrate 160 MG tablet TAKE 1 TABLET (160 MG TOTAL) BY MOUTH DAILY. 30 tablet 12  . fluticasone (FLONASE) 50 MCG/ACT nasal spray Place 1 spray into both nostrils daily.    Marland Kitchen glipiZIDE (GLUCOTROL) 5 MG tablet 27m in the AM and 2.5-542min the PM 180 tablet 3  . guaiFENesin-codeine (CHERATUSSIN AC) 100-10 MG/5ML syrup Take 5 mLs 2 (two) times daily as needed by mouth for cough. 120 mL 0  . lisinopril (PRINIVIL,ZESTRIL) 20 MG tablet TAKE 1 TABLET (20 MG TOTAL) BY MOUTH DAILY. 30 tablet 12  . metFORMIN (GLUCOPHAGE) 500 MG tablet Take 2 tablets (1,000 mg total) by mouth 2 (two) times daily with a meal. 120 tablet 12  . omeprazole (PRILOSEC) 40 MG capsule TAKE 1 CAPSULE (40 MG TOTAL) BY MOUTH DAILY AS NEEDED    . ONETOUCH DELICA LANCETS 3342HISC USE TO TEST BLOOD SUGARS AS  DIRECTED 100 each 2  . ONETOUCH VERIO test strip USE TO TEST BLOOD SUGARS AS DIRECTED 100 each 2  . oxyCODONE-acetaminophen (PERCOCET/ROXICET) 5-325 MG tablet Take 2 tablets by mouth every 6 (six) hours as needed (for kidney stones). 30 tablet 0  . sitaGLIPtin (JANUVIA) 100 MG tablet Take 0.5-1 tablets (50-100 mg total) by mouth daily. 30 tablet 1   No current facility-administered medications on file prior to visit.     Allergies  Allergen Reactions  . Flomax [Tamsulosin Hcl]     nausea    Family History  Problem Relation Age of Onset  . COPD Mother   . Arthritis Mother   . Diabetes Mother   . Hypertension Mother   . Lung cancer Father   . Colon cancer Maternal Grandmother [id number]. Prostate cancer Neg Hx   . Esophageal cancer Neg Hx   . Rectal cancer Neg Hx   . Stomach cancer Neg Hx   . Thyroid disease Neg Hx     BP 108/78 (BP Location: Left Arm, Patient Position: Sitting, Cuff Size: Normal)   Pulse 73   Wt 152 lb 3.2 oz (69 kg)   SpO2 98%   BMI 25.33 kg/m   Review of Systems denies fever, headache, hoarseness, visual loss, palpitations, sob, diarrhea, myalgias, edema, excessive diaphoresis, tremor, anxiety, heat intolerance, and easy bruising.  He has rhinorrhea and urinary frequency.      Objective:   Physical Exam VS: see vs page GEN: no distress HEAD: head: no deformity eyes: no periorbital swelling, no proptosis external nose and ears are normal mouth: no lesion seen NECK: supple, thyroid is not enlarged CHEST WALL: no deformity LUNGS: clear to auscultation CV: reg rate and rhythm, no murmur ABD: abdomen is soft, nontender.  no hepatosplenomegaly.  not distended.  no hernia MUSCULOSKELETAL: muscle bulk and strength are grossly normal.  no obvious joint swelling.  gait is normal and steady EXTEMITIES: no deformity.  no edema PULSES: no carotid bruit NEURO:  cn 2-12 grossly intact.   readily moves all 4's.  sensation is intact to touch on all 4's SKIN:  Normal  texture and temperature.  No rash or suspicious lesion is visible.   NODES:  None palpable at the neck PSYCH: alert, well-oriented.  Does not appear anxious nor depressed.   Thyroid nuc med scan: consistent with Graves disease.  Lab Results  Component Value Date   HGBA1C 10.1 (H) 01/20/2018  I have reviewed outside records, and summarized: Pt was noted to have severely elevated a1c, and referred here.  He was first ref for goiter, but then was noted to have much worse A1c  I personally reviewed electrocardiogram tracing (05/29/17): Indication: preop Impression: SB.  No MI.  Low voltage Compared to 2012: these changes are new     Assessment & Plan:  Chronic primary hypothyroidism: we discussed options: he chooses to decrease to 150/d, rather than vary according to day of the week. type 2 DM: severe exacerbation: he is prob evolving type 1.  We discussed the fact that he'll need insulin, but DM educ is not here now.  Ret 2 days, to address further and see Vaughan Basta.   Patient Instructions  I have sent a prescription to your pharmacy, for levothyroxine, 150 mcg/day. Please come back for a follow-up appointment in 2 days.

## 2018-01-29 ENCOUNTER — Encounter: Payer: Self-pay | Admitting: Endocrinology

## 2018-01-29 ENCOUNTER — Encounter: Payer: 59 | Attending: Endocrinology | Admitting: Nutrition

## 2018-01-29 ENCOUNTER — Ambulatory Visit: Payer: 59 | Admitting: Endocrinology

## 2018-01-29 VITALS — BP 100/70 | HR 71 | Wt 152.6 lb

## 2018-01-29 DIAGNOSIS — Z713 Dietary counseling and surveillance: Secondary | ICD-10-CM | POA: Insufficient documentation

## 2018-01-29 DIAGNOSIS — E119 Type 2 diabetes mellitus without complications: Secondary | ICD-10-CM | POA: Insufficient documentation

## 2018-01-29 MED ORDER — INSULIN LISPRO 100 UNIT/ML (KWIKPEN): 3.0000 [IU] | PEN_INJECTOR | Freq: Three times a day (TID) | SUBCUTANEOUS | 11 refills | Status: DC

## 2018-01-29 NOTE — Progress Notes (Signed)
Subjective:    Patient ID: Ryan Crane, male    DOB: 1956-08-28, 62 y.o.   MRN: [id number]  HPI Pt returns for f/u of diabetes mellitus:  DM type: 1 Dx'ed: 1740 Complications: none Therapy: 3 oral meds DKA: never Severe hypoglycemia: never Pancreatitis: never Pancreatic imaging: no mention is made on 2018 abd [place] Other: he has never been on insulin.   Interval history: pt reports nocturia, but no n/v. He says cbg's are in the 200's.  Past Medical History:  Diagnosis Date  . Allergy   . Arthritis   . Cholelithiasis    symptomatic  . Diabetes mellitus ([place])   . Family history of malignant neoplasm of gastrointestinal tract   . Fatty liver   . GERD (gastroesophageal reflux disease)   . Graves disease    s/p ablation  . Hemochromatosis   . History of chicken pox   . History of hiatal hernia   . History of kidney stones   . Hyperlipidemia   . Hypertension   . Internal hemorrhoids   . Kidney stones    . Personal history of colonic polyps 03/31/2008   tubular adenoma  . Right fibular fracture 2013    Past Surgical History:  Procedure Laterality Date  . CHOLECYSTECTOMY N/A 05/29/2017   Procedure: LAPAROSCOPIC CHOLECYSTECTOMY;  Surgeon: Clovis Riley, MD;  Location: [place];  Service: General;  Laterality: N/A;  . COLONOSCOPY W/ BIOPSIES AND POLYPECTOMY    . LITHOTRIPSY    . URETERAL STENT PLACEMENT     left    Social History   Socioeconomic History  . Marital status: Married    Spouse name: Not on file  . Number of children: 1  . Years of education: Not on file  . Highest education level: Not on file  Social Needs  . Financial resource strain: Not on file  . Food insecurity - worry: Not on file  . Food insecurity - inability: Not on file  . Transportation needs - medical: Not on file  . Transportation needs - non-medical: Not on file  Occupational History  . Occupation: Hydrologist  . Occupation: PROJECT Health visitor: [employer]  Tobacco Use  . Smoking status: Never Smoker  . Smokeless tobacco: Never Used  Substance and Sexual Activity  . Alcohol use: No    Alcohol/week: 0.0 oz  . Drug use: No  . Sexual activity: Yes  Other Topics Concern  . Not on file  Social History Narrative   2 years of college   Manager at [employer]   Married 1977   1 daughter    Current Outpatient Medications on File Prior to Visit  Medication Sig Dispense Refill  . albuterol (VENTOLIN HFA) 108 (90 Base) MCG/ACT inhaler INHALE 1-2 PUFFS INTO THE LUNGS EVERY 6 (SIX) HOURS AS NEEDED FOR WHEEZING OR SHORTNESS OF BREATH. 18 Inhaler 3  . aspirin 81 MG tablet Take 81 mg by mouth daily.      . Blood Glucose Monitoring Suppl (ONETOUCH VERIO) w/Device KIT 1 kit by Does not apply route daily. Use to test blood sugar once daily Dx E11.9 1 kit 0  . cyanocobalamin (,VITAMIN B-12,) 1000 MCG/ML injection Inject 1,000 mcg into the muscle every 30 (thirty) days.    . fenofibrate 160 MG tablet TAKE 1 TABLET (160 MG TOTAL) BY MOUTH DAILY. 30 tablet 12  . fluticasone (FLONASE) 50 MCG/ACT nasal spray Place 1 spray into  both nostrils daily.    Marland Kitchen glipiZIDE (GLUCOTROL) 5 MG tablet '5mg'$  in the AM and 2.5-'5mg'$  in the PM 180 tablet 3  . guaiFENesin-codeine (CHERATUSSIN AC) 100-10 MG/5ML syrup Take 5 mLs 2 (two) times daily as needed by mouth for cough. 120 mL 0  . levothyroxine (SYNTHROID, LEVOTHROID) 150 MCG tablet Take 1 tablet (150 mcg total) by mouth daily before breakfast. 30 tablet 11  . lisinopril (PRINIVIL,ZESTRIL) 20 MG tablet TAKE 1 TABLET (20 MG TOTAL) BY MOUTH DAILY. 30 tablet 12  . omeprazole (PRILOSEC) 40 MG capsule TAKE 1 CAPSULE (40 MG TOTAL) BY MOUTH DAILY AS NEEDED    . ONETOUCH DELICA LANCETS 36U MISC USE TO TEST BLOOD SUGARS AS DIRECTED 100 each 2  . ONETOUCH VERIO test strip USE TO TEST BLOOD SUGARS AS DIRECTED 100 each 2  . oxyCODONE-acetaminophen (PERCOCET/ROXICET) 5-325 MG tablet Take 2 tablets by mouth every 6 (six) hours as needed  (for kidney stones). 30 tablet 0  . sitaGLIPtin (JANUVIA) 100 MG tablet Take 0.5-1 tablets (50-100 mg total) by mouth daily. 30 tablet 1   No current facility-administered medications on file prior to visit.     Allergies  Allergen Reactions  . Flomax [Tamsulosin Hcl]     nausea    Family History  Problem Relation Age of Onset  . COPD Mother   . Arthritis Mother   . Diabetes Mother   . Hypertension Mother   . Lung cancer Father   . Colon cancer Maternal Grandmother 32  . Prostate cancer Neg Hx   . Esophageal cancer Neg Hx   . Rectal cancer Neg Hx   . Stomach cancer Neg Hx   . Thyroid disease Neg Hx     BP 100/70 (BP Location: Right Arm, Patient Position: Sitting, Cuff Size: Normal)   Pulse 71   Wt 152 lb 9.6 oz (69.2 kg)   BMI 25.39 kg/m     Review of Systems He denies hypoglycemia.     Objective:   Physical Exam VITAL SIGNS:  See vs page GENERAL: no distress Pulses: dorsalis pedis intact bilat.   MSK: no deformity of the feet CV: no leg edema Skin:  no ulcer on the feet.  normal color and temp on the feet. Neuro: sensation is intact to touch on the feet  Lab Results  Component Value Date   HGBA1C 10.1 (H) 01/20/2018   Lab Results  Component Value Date   CREATININE 1.06 01/20/2018   BUN 17 01/20/2018   NA 137 01/20/2018   K 4.2 01/20/2018   CL 100 01/20/2018   CO2 27 01/20/2018        Assessment & Plan:  Type 1 DM: he needs insulin.  We discussed.  Patient Instructions  check your blood sugar 4 times a day: before the 3 meals, and at bedtime.  also check if you have symptoms of your blood sugar being too high or too low.  please keep a record of the readings and bring it to your next appointment here (or you can bring the meter itself).  You can write it on any piece of paper.  please call us sooner if your blood sugar goes below 70, or if you have a lot of readings over 200. I have sent a prescription to your pharmacy, to start 3 units 3 times a  day (just before each meal).  Please continue the same diabetes pills for now, but we'll phase these out with time.   Please call or  message [place] next week, to tell us how the blood sugar is doing.   Please come back for a follow-up appointment in 2 months.

## 2018-01-29 NOTE — Patient Instructions (Addendum)
check your blood sugar 4 times a day: before the 3 meals, and at bedtime.  also check if you have symptoms of your blood sugar being too high or too low.  please keep a record of the readings and bring it to your next appointment here (or you can bring the meter itself).  You can write it on any piece of paper.  please call us sooner if your blood sugar goes below 70, or if you have a lot of readings over 200. I have sent a prescription to your pharmacy, to start 3 units 3 times a day (just before each meal).  Please continue the same diabetes pills for now, but we'll phase these out with time.   Please call or message [place] next week, to tell us how the blood sugar is doing.   Please come back for a follow-up appointment in 2 months.

## 2018-01-29 NOTE — Progress Notes (Signed)
Pt. Was trained on how the insulin works, how to take the insulin and why he needs the insulin.  Questions were answered about timing and storage of insulin.   We also discussed low blood sugars--symptoms and treatments.  He had no final questions.  Brochure given on "starting insulin".

## 2018-01-31 NOTE — Patient Instructions (Signed)
Take insulin as directed by Dr. Loanne Drilling. Read over information given on taking insulin and call if questions.

## 2018-02-03 ENCOUNTER — Other Ambulatory Visit: Payer: Self-pay

## 2018-02-03 ENCOUNTER — Telehealth: Payer: Self-pay | Admitting: Endocrinology

## 2018-02-03 ENCOUNTER — Telehealth: Payer: Self-pay | Admitting: Family Medicine

## 2018-02-03 MED ORDER — GLUCOSE BLOOD VI STRP: ORAL_STRIP | 2 refills | Status: DC

## 2018-02-03 MED ORDER — ONETOUCH DELICA LANCETS 33G MISC: 0 refills | Status: DC

## 2018-02-03 MED ORDER — GLUCOSE BLOOD VI STRP: ORAL_STRIP | 0 refills | Status: DC

## 2018-02-03 MED ORDER — ONETOUCH DELICA LANCETS 33G MISC: 2 refills | Status: DC

## 2018-02-03 NOTE — Telephone Encounter (Signed)
Patient need a refill of his test strips and lancets he test 7 x a day of each. He need at least 200.  [id number] [place] [place]. DEA #:  [id number]

## 2018-02-03 NOTE — Telephone Encounter (Signed)
Copied from [place] [phone]. Topic: Quick Communication - Rx Refill/Question >> Feb 03, 2018 10:32 AM Yvette Rack wrote: Medication:  Jonetta Speak LANCETS 24M MISC  Pt  need 210 lancets  ONETOUCH VERIO test strip 210 strips pt test  7 times daily      Has the patient contacted their pharmacy? Yes.  Pt will call after he hung up with me   (Agent: If no, request that the patient contact the pharmacy for the refill.)   Preferred Pharmacy (with phone number or street name): [id number] [place] [place]. [phone] (Phone) [phone] (Fax)     Agent: Please be advised that RX refills may take up to 3 business days. We ask that you follow-up with your pharmacy.

## 2018-02-03 NOTE — Telephone Encounter (Signed)
sent 

## 2018-02-04 ENCOUNTER — Ambulatory Visit (INDEPENDENT_AMBULATORY_CARE_PROVIDER_SITE_OTHER): Payer: 59

## 2018-02-04 DIAGNOSIS — E538 Deficiency of other specified B group vitamins: Secondary | ICD-10-CM

## 2018-02-04 MED ORDER — CYANOCOBALAMIN 1000 MCG/ML IJ SOLN
1000.0000 ug | Freq: Once | INTRAMUSCULAR | Status: AC
  Administered 2018-02-04: 1000 ug via INTRAMUSCULAR

## 2018-02-11 ENCOUNTER — Telehealth: Payer: Self-pay | Admitting: Endocrinology

## 2018-02-11 NOTE — Telephone Encounter (Signed)
Pt. Was told of dosage increase.  Also wants more test strips. He was told that it was called in to the pharmacy on 2/25.

## 2018-02-11 NOTE — Telephone Encounter (Signed)
please call patient: I received cbg record: Please increase humalog to 6 units 3 times a day (just before each meal). Please let us know on Friday how cbg's are doing.

## 2018-02-12 ENCOUNTER — Other Ambulatory Visit: Payer: Self-pay

## 2018-02-12 NOTE — Telephone Encounter (Signed)
I spoke with patient & he hasn't checked pharmacy since 2/25 when strips were sent for 400 per month. He stated he would check today & let us know that prescription was received.

## 2018-02-17 ENCOUNTER — Other Ambulatory Visit: Payer: 59

## 2018-02-17 ENCOUNTER — Other Ambulatory Visit: Payer: Self-pay

## 2018-02-17 ENCOUNTER — Telehealth: Payer: Self-pay | Admitting: Endocrinology

## 2018-02-17 MED ORDER — GLUCOSE BLOOD VI STRP: ORAL_STRIP | 0 refills | Status: DC

## 2018-02-17 MED ORDER — ONETOUCH DELICA LANCETS 33G MISC: 0 refills | Status: DC

## 2018-02-17 NOTE — Telephone Encounter (Signed)
Patient is having trouble getting his RX. Patient needs RX for Lancets and Test Strips. Please send script for OneTouch Verio Test Strips AND OneTouch Lancets (not sure what the exact name [facility] are) [facility] [address] Pharmacy has not received scripts.

## 2018-02-17 NOTE — Telephone Encounter (Signed)
I have sent both to patient's pharmacy.

## 2018-02-19 NOTE — Telephone Encounter (Signed)
Ryan Crane reports that he has gone up on the Humalog to 8u, after being on 6uX1 week.  FBS today was 197, and 2hr. pc reading was 189.  He is planning to drop by his blood sugar readings for Dr. Loanne Drilling to review today.

## 2018-02-20 ENCOUNTER — Other Ambulatory Visit: Payer: Self-pay

## 2018-02-20 MED ORDER — ONETOUCH DELICA LANCETS 33G MISC: 1 refills | Status: DC

## 2018-02-21 ENCOUNTER — Other Ambulatory Visit: Payer: Self-pay

## 2018-02-21 MED ORDER — GLUCOSE BLOOD VI STRP: ORAL_STRIP | 3 refills | Status: DC

## 2018-03-03 ENCOUNTER — Other Ambulatory Visit: Payer: Self-pay | Admitting: *Deleted

## 2018-03-07 ENCOUNTER — Telehealth: Payer: Self-pay | Admitting: Endocrinology

## 2018-03-07 MED ORDER — INSULIN LISPRO 100 UNIT/ML (KWIKPEN): PEN_INJECTOR | SUBCUTANEOUS | 11 refills | Status: DC

## 2018-03-07 MED ORDER — BASAGLAR KWIKPEN 100 UNIT/ML ~~LOC~~ SOPN: 5.0000 [IU] | PEN_INJECTOR | Freq: Every day | SUBCUTANEOUS | 11 refills | Status: DC

## 2018-03-07 NOTE — Telephone Encounter (Signed)
Patient is returning phone call from office

## 2018-03-07 NOTE — Telephone Encounter (Signed)
please call patient: D/c glipizide Add basaglar, 5 units qhs Increase humalog to 3 times a day (just before each meal) 07-17-09 units. Please call or message [place] next week, to tell us how the blood sugar is doing.

## 2018-03-07 NOTE — Telephone Encounter (Signed)
Patient is already taking the 09-18-09 units of humalog before each meal. He also said that he hasn't been snacking between meals & blood sugars have ranged between 110-150. Please advise if patient should decrease. He also said that he could drop off BS log if need be Monday.

## 2018-03-07 NOTE — Telephone Encounter (Signed)
Ok, so please increase humalog to 3 times a day (just before each meal) 11-20-13 units.   Please call or message [place] next week, to tell us how the blood sugar is doing

## 2018-03-07 NOTE — Telephone Encounter (Signed)
I called patient & asked for him to call back to get new insulin increase dosages.

## 2018-03-09 ENCOUNTER — Other Ambulatory Visit: Payer: Self-pay | Admitting: Family Medicine

## 2018-03-10 ENCOUNTER — Other Ambulatory Visit: Payer: 59

## 2018-03-10 NOTE — Telephone Encounter (Signed)
This is because we are stopping the glipizide.  I anticipate your insulin need will go way up.  However, it is fine with me to first stop the glipizide, then increase the insulin when cbg's go up.

## 2018-03-10 NOTE — Telephone Encounter (Signed)
I called & explained to patient that since glipizide was d/c that BS may start to increase that's why he should start the higher dose. Patient stated that he would do so, but was still going to drop off CBG log.

## 2018-03-10 NOTE — Telephone Encounter (Signed)
Patient feels it may be unsafe to make those changes based on his latest BS readings. He is bringing by a copy on lunch, but he states he hasn't see a BS over 171. He just wants you to review his log before any more changes are made.

## 2018-03-11 ENCOUNTER — Ambulatory Visit (INDEPENDENT_AMBULATORY_CARE_PROVIDER_SITE_OTHER): Payer: 59

## 2018-03-11 DIAGNOSIS — E538 Deficiency of other specified B group vitamins: Secondary | ICD-10-CM

## 2018-03-11 MED ORDER — CYANOCOBALAMIN 1000 MCG/ML IJ SOLN
1000.0000 ug | Freq: Once | INTRAMUSCULAR | Status: AC
  Administered 2018-03-11: 1000 ug via INTRAMUSCULAR

## 2018-03-12 NOTE — Progress Notes (Signed)
Agree. Thanks

## 2018-03-16 ENCOUNTER — Other Ambulatory Visit: Payer: Self-pay | Admitting: Family Medicine

## 2018-03-16 DIAGNOSIS — E538 Deficiency of other specified B group vitamins: Secondary | ICD-10-CM

## 2018-03-16 DIAGNOSIS — E0500 Thyrotoxicosis with diffuse goiter without thyrotoxic crisis or storm: Secondary | ICD-10-CM

## 2018-03-16 DIAGNOSIS — E119 Type 2 diabetes mellitus without complications: Secondary | ICD-10-CM

## 2018-03-16 DIAGNOSIS — Z125 Encounter for screening for malignant neoplasm of prostate: Secondary | ICD-10-CM

## 2018-03-17 ENCOUNTER — Other Ambulatory Visit (INDEPENDENT_AMBULATORY_CARE_PROVIDER_SITE_OTHER): Payer: 59

## 2018-03-17 DIAGNOSIS — E0500 Thyrotoxicosis with diffuse goiter without thyrotoxic crisis or storm: Secondary | ICD-10-CM

## 2018-03-17 DIAGNOSIS — E538 Deficiency of other specified B group vitamins: Secondary | ICD-10-CM | POA: Diagnosis not present

## 2018-03-17 DIAGNOSIS — E119 Type 2 diabetes mellitus without complications: Secondary | ICD-10-CM

## 2018-03-17 DIAGNOSIS — Z125 Encounter for screening for malignant neoplasm of prostate: Secondary | ICD-10-CM | POA: Diagnosis not present

## 2018-03-17 LAB — LIPID PANEL
CHOLESTEROL: 146 mg/dL (ref 0–200)
HDL: 45 mg/dL (ref >39.00)
LDL CALC: 86 mg/dL (ref 0–99)
NONHDL: 101.37
Total CHOL/HDL Ratio: 3
Triglycerides: 78 mg/dL (ref 0.0–149.0)
VLDL: 15.6 mg/dL (ref 0.0–40.0)

## 2018-03-17 LAB — COMPREHENSIVE METABOLIC PANEL
ALBUMIN: 4.4 g/dL (ref 3.5–5.2)
ALT: 25 U/L (ref 0–53)
AST: 32 U/L (ref 0–37)
Alkaline Phosphatase: 83 U/L (ref 39–117)
BUN: 18 mg/dL (ref 6–23)
CHLORIDE: 104 meq/L (ref 96–112)
CO2: 29 mEq/L (ref 19–32)
CREATININE: 1.1 mg/dL (ref 0.40–1.50)
Calcium: 9.2 mg/dL (ref 8.4–10.5)
GFR: 71.74 mL/min (ref >60.00)
GLUCOSE: 149 mg/dL — AB (ref 70–99)
Potassium: 4.6 mEq/L (ref 3.5–5.1)
SODIUM: 140 meq/L (ref 135–145)
TOTAL PROTEIN: 6.7 g/dL (ref 6.0–8.3)
Total Bilirubin: 0.7 mg/dL (ref 0.2–1.2)

## 2018-03-17 LAB — TSH: TSH: 1.27 u[IU]/mL (ref 0.35–4.50)

## 2018-03-17 LAB — PSA: PSA: 1.06 ng/mL (ref 0.10–4.00)

## 2018-03-17 LAB — VITAMIN B12: Vitamin B-12: 716 pg/mL (ref 211–911)

## 2018-03-24 ENCOUNTER — Other Ambulatory Visit: Payer: Self-pay | Admitting: Family Medicine

## 2018-03-24 ENCOUNTER — Encounter: Payer: Self-pay | Admitting: Family Medicine

## 2018-03-24 ENCOUNTER — Ambulatory Visit (INDEPENDENT_AMBULATORY_CARE_PROVIDER_SITE_OTHER): Payer: 59 | Admitting: Family Medicine

## 2018-03-24 VITALS — BP 102/70 | HR 68 | Temp 97.5°F | Wt 158.8 lb

## 2018-03-24 DIAGNOSIS — Z0000 Encounter for general adult medical examination without abnormal findings: Secondary | ICD-10-CM

## 2018-03-24 DIAGNOSIS — Z23 Encounter for immunization: Secondary | ICD-10-CM

## 2018-03-24 DIAGNOSIS — E0500 Thyrotoxicosis with diffuse goiter without thyrotoxic crisis or storm: Secondary | ICD-10-CM

## 2018-03-24 DIAGNOSIS — E538 Deficiency of other specified B group vitamins: Secondary | ICD-10-CM

## 2018-03-24 DIAGNOSIS — N200 Calculus of kidney: Secondary | ICD-10-CM

## 2018-03-24 DIAGNOSIS — K219 Gastro-esophageal reflux disease without esophagitis: Secondary | ICD-10-CM

## 2018-03-24 DIAGNOSIS — I10 Essential (primary) hypertension: Secondary | ICD-10-CM

## 2018-03-24 DIAGNOSIS — E119 Type 2 diabetes mellitus without complications: Secondary | ICD-10-CM

## 2018-03-24 MED ORDER — SITAGLIPTIN PHOSPHATE 100 MG PO TABS: ORAL_TABLET | ORAL | Status: DC

## 2018-03-24 MED ORDER — OMEPRAZOLE 40 MG PO CPDR: DELAYED_RELEASE_CAPSULE | ORAL | 2 refills | Status: DC

## 2018-03-24 MED ORDER — LISINOPRIL 20 MG PO TABS: ORAL_TABLET | ORAL | 12 refills | Status: DC

## 2018-03-24 MED ORDER — BASAGLAR KWIKPEN 100 UNIT/ML ~~LOC~~ SOPN: 7.0000 [IU] | PEN_INJECTOR | Freq: Every day | SUBCUTANEOUS | Status: DC

## 2018-03-24 MED ORDER — OXYCODONE-ACETAMINOPHEN 5-325 MG PO TABS: 1.0000 | ORAL_TABLET | Freq: Four times a day (QID) | ORAL | 0 refills | Status: DC | PRN

## 2018-03-24 MED ORDER — INSULIN LISPRO 100 UNIT/ML (KWIKPEN): PEN_INJECTOR | SUBCUTANEOUS | Status: DC

## 2018-03-24 NOTE — Progress Notes (Signed)
CPE- See plan.  Routine anticipatory guidance given to patient.  See health maintenance.  The possibility exists that previously documented standard health maintenance information may have been brought forward from a previous encounter into this note.  If needed, that same information has been updated to reflect the current situation based on today's encounter.    Tetanus 2019 Flu done 2018 Shingles shot prev done.  PNA shot 2017 Colonoscopy 2013  PSA prev wnl.  D/w pt about options.  Would be okay to check with physicals.   Living will d/w pt. Would have his wife designated if pt were incapacitated.   GERD. Prn use of PPI, not daily.  Improved with less need for med with cutting out fried foods.    Hypertension:    Using medication without problems or lightheadedness:  Only rarely lightheaded with sudden and severe position change.  D/w pt about cautions.   Chest pain with exertion:no Edema:no Short of breath:no Labs d/w pt.    Diet and exercise d/w pt. He's working on both, esp diet with hemochromatosis.  He is likely better sticking with hemochromatosis diet and treating his diabetes, d/w pt.  He is on low iron diet.  Last phlebotomy was in 2018.  Recent CBC and ferritin reasonable.  D/w pt.    H/o renal stones.  D/w pt about having oxycodone on hand in case of another episode, he uses it rarely.    DM2 per endo.  He had to inc his insulin use recently with some higher sugars noted.  Med list updated. Rare lows, down to 64 once recently.  D/w pt about cautions.  Letter done for travel.  No foot sx recently.    B12 def on IM replacement.  Still on monthly tx with normal level.    Hypothyroidism.  On replacement. TSh wnl.  No neck mass, no lumps.  No dysphagia usually but occ with some sx from hiatal hernia and that is rare.  No upper esophageal sx recently.  He had esophageal dilation done years ago.    PMH and SH reviewed  Meds, vitals, and allergies reviewed.   ROS: Per HPI.   Unless specifically indicated otherwise in HPI, the patient denies:  General: fever. Eyes: acute vision changes ENT: sore throat Cardiovascular: chest pain Respiratory: SOB GI: vomiting GU: dysuria Musculoskeletal: acute back pain Derm: acute rash Neuro: acute motor dysfunction Psych: worsening mood Endocrine: polydipsia Heme: bleeding Allergy: hayfever  GEN: nad, alert and oriented HEENT: mucous membranes moist NECK: supple w/o LA CV: rrr. PULM: ctab, no inc wob ABD: soft, +bs EXT: no edema SKIN: no acute rash

## 2018-03-24 NOTE — Patient Instructions (Addendum)
If you don't get a call about rechecking your ferritin and CBC in about 07/2018, then let me know.   We can do the labs then if needed.  Take care.  Glad to see you.  I'll defer otherwise.  Update me as needed.

## 2018-03-25 NOTE — Assessment & Plan Note (Signed)
Last phlebotomy was in 2018.  Recent CBC and ferritin reasonable.  D/w pt. continue as is.  See after visit summary.

## 2018-03-25 NOTE — Assessment & Plan Note (Addendum)
GERD. Prn use of PPI, not daily.  Improved with less need for med with cutting out fried foods.   Continue as is.  If he has more trouble with dysphagia he can let me know.  He has history of esophageal dilation years ago.

## 2018-03-25 NOTE — Assessment & Plan Note (Signed)
Reasonable level at this point.  Still on monthly treatment.  Would continue as is.  He agrees.

## 2018-03-25 NOTE — Assessment & Plan Note (Signed)
Tetanus 2019 Flu done 2018 Shingles shot prev done.  PNA shot 2017 Colonoscopy 2013  PSA prev wnl.  D/w pt about options.  Would be okay to check with physicals.   Living will d/w pt. Would have his wife designated if pt were incapacitated.

## 2018-03-25 NOTE — Assessment & Plan Note (Signed)
Per endocrinology.  Letter done for travel.  Follow-up with endocrinology.  No change in medications at this point.

## 2018-03-25 NOTE — Assessment & Plan Note (Signed)
No symptoms now.  Discussed with patient about having oxycodone prescription on hand just in case he had another episode.

## 2018-03-25 NOTE — Assessment & Plan Note (Signed)
Controlled.  Continue as is.  Labs discussed with patient.  No change in meds.

## 2018-03-25 NOTE — Assessment & Plan Note (Signed)
History of.  Now on replacement.  TSH normal.  Continue as is.  He agrees.

## 2018-03-27 ENCOUNTER — Telehealth: Payer: Self-pay | Admitting: Endocrinology

## 2018-03-27 NOTE — Telephone Encounter (Signed)
please call patient: I received cbg record Please reduce the humalog to 3 times a day (just before each meal)10-20-12 units, and: Increase basaglar to 10 units qhs

## 2018-03-31 NOTE — Telephone Encounter (Signed)
I called and went over new dosages with patient. He had no further questions at this time.

## 2018-04-07 ENCOUNTER — Ambulatory Visit: Payer: 59 | Admitting: Endocrinology

## 2018-04-07 ENCOUNTER — Encounter: Payer: Self-pay | Admitting: Endocrinology

## 2018-04-07 ENCOUNTER — Encounter: Payer: 59 | Attending: Endocrinology | Admitting: Nutrition

## 2018-04-07 ENCOUNTER — Other Ambulatory Visit: Payer: Self-pay

## 2018-04-07 VITALS — BP 108/68 | HR 65 | Resp 16 | Wt 163.6 lb

## 2018-04-07 DIAGNOSIS — E119 Type 2 diabetes mellitus without complications: Secondary | ICD-10-CM | POA: Insufficient documentation

## 2018-04-07 DIAGNOSIS — Z713 Dietary counseling and surveillance: Secondary | ICD-10-CM | POA: Diagnosis present

## 2018-04-07 LAB — POCT GLYCOSYLATED HEMOGLOBIN (HGB A1C): HEMOGLOBIN A1C: 7.5

## 2018-04-07 MED ORDER — BASAGLAR KWIKPEN 100 UNIT/ML ~~LOC~~ SOPN: 14.0000 [IU] | PEN_INJECTOR | Freq: Every day | SUBCUTANEOUS | Status: DC

## 2018-04-07 MED ORDER — INSULIN LISPRO 100 UNIT/ML (KWIKPEN): PEN_INJECTOR | SUBCUTANEOUS | Status: DC

## 2018-04-07 NOTE — Progress Notes (Signed)
Subjective:    Patient ID: Ryan Crane, male    DOB: 05-13-1956, 62 y.o.   MRN: [id number]  HPI Pt returns for f/u of diabetes mellitus:  DM type: 1 Dx'ed: 5003 Complications: none Therapy: insulin since 2019, and januvia.  DKA: never.  Severe hypoglycemia: never Pancreatitis: never Pancreatic imaging: no mention is made on 2018 abd [place].  Other: he takes multiple daily injections.   Interval history:  pt states he feels well in general.  He brings a record of his cbg's which I have reviewed today.  It varies from 71-300.  There is no trend throughout the day, but pc are similar to ac Past Medical History:  Diagnosis Date  . Allergy   . Arthritis   . Cholelithiasis    symptomatic  . Diabetes mellitus ([place])   . Family history of malignant neoplasm of gastrointestinal tract   . Fatty liver   . GERD (gastroesophageal reflux disease)   . Graves disease    s/p ablation  . Hemochromatosis   . History of chicken pox   . History of hiatal hernia   . History of kidney stones   . Hyperlipidemia   . Hypertension   . Internal hemorrhoids   . Kidney stones    . Personal history of colonic polyps 03/31/2008   tubular adenoma  . Right fibular fracture 2013    Past Surgical History:  Procedure Laterality Date  . CHOLECYSTECTOMY N/A 05/29/2017   Procedure: LAPAROSCOPIC CHOLECYSTECTOMY;  Surgeon: Clovis Riley, MD;  Location: [place];  Service: General;  Laterality: N/A;  . COLONOSCOPY W/ BIOPSIES AND POLYPECTOMY    . LITHOTRIPSY    . URETERAL STENT PLACEMENT     left    Social History   Socioeconomic History  . Marital status: Married    Spouse name: Not on file  . Number of children: 1  . Years of education: Not on file  . Highest education level: Not on file  Occupational History  . Occupation: Hydrologist  . Occupation: PROJECT Health visitor: [place]  . Financial resource strain: Not on file  . Food insecurity:   Worry: Not on file    Inability: Not on file  . Transportation needs:    Medical: Not on file    Non-medical: Not on file  Tobacco Use  . Smoking status: Never Smoker  . Smokeless tobacco: Never Used  Substance and Sexual Activity  . Alcohol use: No    Alcohol/week: 0.0 oz  . Drug use: No  . Sexual activity: Yes  Lifestyle  . Physical activity:    Days per week: Not on file    Minutes per session: Not on file  . Stress: Not on file  Relationships  . Social connections:    Talks on phone: Not on file    Gets together: Not on file    Attends religious service: Not on file    Active member of club or organization: Not on file    Attends meetings of clubs or organizations: Not on file    Relationship status: Not on file  . Intimate partner violence:    Fear of current or ex partner: Not on file    Emotionally abused: Not on file    Physically abused: Not on file    Forced sexual activity: Not on file  Other Topics Concern  . Not on file  Social History  Narrative   2 years of college   Manager at [employer]   Married 1977   1 daughter    Current Outpatient Medications on File Prior to Visit  Medication Sig Dispense Refill  . albuterol (VENTOLIN HFA) 108 (90 Base) MCG/ACT inhaler INHALE 1-2 PUFFS INTO THE LUNGS EVERY 6 (SIX) HOURS AS NEEDED FOR WHEEZING OR SHORTNESS OF BREATH. 18 Inhaler 3  . aspirin 81 MG tablet Take 81 mg by mouth daily.      . Blood Glucose Monitoring Suppl (ONETOUCH VERIO) w/Device KIT 1 kit by Does not apply route daily. Use to test blood sugar once daily Dx E11.9 1 kit 0  . cyanocobalamin (,VITAMIN B-12,) 1000 MCG/ML injection Inject 1,000 mcg into the muscle every 30 (thirty) days.    . fenofibrate 160 MG tablet TAKE 1 TABLET BY MOUTH EVERY DAY 30 tablet 11  . fluticasone (FLONASE) 50 MCG/ACT nasal spray Place 1 spray into both nostrils daily.    Marland Kitchen glucose blood (ONETOUCH VERIO) test strip USE TO TEST BLOOD SUGARS AS DIRECTED 7 TIMES A DAY. 600 each 3    . guaiFENesin-codeine (CHERATUSSIN AC) 100-10 MG/5ML syrup Take 5 mLs 2 (two) times daily as needed by mouth for cough. 120 mL 0  . levothyroxine (SYNTHROID, LEVOTHROID) 150 MCG tablet Take 1 tablet (150 mcg total) by mouth daily before breakfast. 30 tablet 11  . lisinopril (PRINIVIL,ZESTRIL) 20 MG tablet TAKE 1 TABLET (20 MG TOTAL) BY MOUTH DAILY. 30 tablet 12  . omeprazole (PRILOSEC) 40 MG capsule TAKE 1 CAPSULE (40 MG TOTAL) BY MOUTH DAILY AS NEEDED 30 capsule 2  . ONETOUCH DELICA LANCETS 85U MISC USE TO TEST BLOOD SUGARS 7 TIMES DAILY 700 each 1  . oxyCODONE-acetaminophen (PERCOCET/ROXICET) 5-325 MG tablet Take 1-2 tablets by mouth every 6 (six) hours as needed (for kidney stones). 10 tablet 0   No current facility-administered medications on file prior to visit.     Allergies  Allergen Reactions  . Flomax [Tamsulosin Hcl]     nausea    Family History  Problem Relation Age of Onset  . COPD Mother   . Arthritis Mother   . Diabetes Mother   . Hypertension Mother   . Lung cancer Father   . Colon cancer Maternal Grandmother 30  . Prostate cancer Neg Hx   . Esophageal cancer Neg Hx   . Rectal cancer Neg Hx   . Stomach cancer Neg Hx   . Thyroid disease Neg Hx     BP 108/68   Pulse 65   Resp 16   Wt 163 lb 9.6 oz (74.2 kg)   SpO2 97%   BMI 27.22 kg/m    Review of Systems He denies hypoglycemia.      Objective:   Physical Exam VITAL SIGNS:  See vs page.   GENERAL: no distress.  Pulses: dorsalis pedis intact bilat.   MSK: no deformity of the feet CV: trace bilat leg edema.   Skin:  no ulcer on the feet.  normal color and temp on the feet.  Neuro: sensation is intact to touch on the feet.   A1c=7.5%    Assessment & Plan:  Type 1 DM: he needs increased rx.   Patient Instructions  check your blood sugar 4 times a day: before the 3 meals, and at bedtime.  also check if you have symptoms of your blood sugar being too high or too low.  please keep a record of the  readings and bring it  to your next appointment here (or you can bring the meter itself).  You can write it on any piece of paper.  please call us sooner if your blood sugar goes below 70, or if you have a lot of readings over 200. Please increase the basaglar to 14 units at bedtime, and: Please continue the same humalog. Please see Vaughan Basta, to consider an insulin pump.  Please come back for a follow-up appointment in 2 months.

## 2018-04-07 NOTE — Patient Instructions (Signed)
check your blood sugar 4 times a day: before the 3 meals, and at bedtime.  also check if you have symptoms of your blood sugar being too high or too low.  please keep a record of the readings and bring it to your next appointment here (or you can bring the meter itself).  You can write it on any piece of paper.  please call us sooner if your blood sugar goes below 70, or if you have a lot of readings over 200. Please increase the basaglar to 14 units at bedtime, and: Please continue the same humalog. Please see Ryan Crane, to consider an insulin pump.  Please come back for a follow-up appointment in 2 months.

## 2018-04-08 NOTE — Progress Notes (Signed)
Pt. Is concerned about high blood sugars and wanted to discuss insulin pumps.  He was shown the different pump and we discussed what insulins cover which blood sugar readings he is doing.  It was clear that his acS readings are very high, because he is snacking mid afternoon and does not want to take an additional injection.  We discussed how a pump would benefit this.  HE asked if he could take more insulin acL and was told that he could try to increase the dose by 1u and reassess readings in 2-3 days.  He agreed to do this, and will call if questions. He was given brochures on the 3 different pumps and we discussed the advantages of each pump and he had no final questions.

## 2018-04-09 ENCOUNTER — Telehealth: Payer: Self-pay | Admitting: Family Medicine

## 2018-04-09 DIAGNOSIS — K862 Cyst of pancreas: Secondary | ICD-10-CM

## 2018-04-09 NOTE — Telephone Encounter (Signed)
Due for f/u MRI pancreas.  Prev with 78mm cyst in the pancreas. ordered.  Thanks.

## 2018-04-09 NOTE — Patient Instructions (Addendum)
Test blood sugars before meals and at bedtime Increase Humalog acL by 1u to bring down high blood sugar readings before lunch Read over information given about pumps and call if questions

## 2018-04-10 ENCOUNTER — Telehealth: Payer: Self-pay | Admitting: Dietician

## 2018-04-10 NOTE — Telephone Encounter (Signed)
Patient advised.

## 2018-04-10 NOTE — Telephone Encounter (Signed)
Patient called stating that he needs his insulins refilled as the doses have been increase causing him to finish his current prescription early.    He is currently taking Humalog 11 units before breakfast and lunch and 13 units before supper as well as Basaglar 14 units q HS.    Message forwarded to Dr. Cordelia Pen nurse.  Antonieta Iba, RD, LDN, CDE

## 2018-04-11 ENCOUNTER — Other Ambulatory Visit: Payer: Self-pay

## 2018-04-11 MED ORDER — INSULIN LISPRO 100 UNIT/ML (KWIKPEN): PEN_INJECTOR | SUBCUTANEOUS | Status: DC

## 2018-04-11 MED ORDER — BASAGLAR KWIKPEN 100 UNIT/ML ~~LOC~~ SOPN: 14.0000 [IU] | PEN_INJECTOR | Freq: Every day | SUBCUTANEOUS | Status: DC

## 2018-04-15 ENCOUNTER — Ambulatory Visit (INDEPENDENT_AMBULATORY_CARE_PROVIDER_SITE_OTHER): Payer: 59

## 2018-04-15 ENCOUNTER — Ambulatory Visit: Payer: 59

## 2018-04-15 DIAGNOSIS — E538 Deficiency of other specified B group vitamins: Secondary | ICD-10-CM

## 2018-04-15 MED ORDER — CYANOCOBALAMIN 1000 MCG/ML IJ SOLN
1000.0000 ug | Freq: Once | INTRAMUSCULAR | Status: AC
  Administered 2018-04-15: 1000 ug via INTRAMUSCULAR

## 2018-04-21 ENCOUNTER — Other Ambulatory Visit: Payer: Self-pay

## 2018-04-22 ENCOUNTER — Other Ambulatory Visit: Payer: Self-pay

## 2018-04-22 ENCOUNTER — Telehealth: Payer: Self-pay | Admitting: Nutrition

## 2018-04-22 MED ORDER — INSULIN LISPRO 100 UNIT/ML (KWIKPEN): PEN_INJECTOR | SUBCUTANEOUS | 11 refills | Status: DC

## 2018-04-22 NOTE — Telephone Encounter (Signed)
Patient called and said when he went to the drug store to pick up his Humalog, it did not have the new insulin dose of 11u acb and lunch, and 12u acS, and as a result, would not get enough for the month.  Can you please resend this dose to the pharmacy, which [facility] [place]

## 2018-04-22 NOTE — Telephone Encounter (Signed)
I LVM with patient to call back to clarify exact amount he is taking for breakfast, lunch & dinner so I could send in correct dose. After I spoke with him this morning I was under the impression that the 10-20-12 was the correct & current dosages.

## 2018-04-23 ENCOUNTER — Ambulatory Visit: Payer: 59 | Admitting: Family Medicine

## 2018-04-23 ENCOUNTER — Ambulatory Visit: Payer: 59 | Admitting: Internal Medicine

## 2018-04-23 ENCOUNTER — Other Ambulatory Visit: Payer: Self-pay | Admitting: *Deleted

## 2018-04-23 ENCOUNTER — Encounter: Payer: Self-pay | Admitting: Internal Medicine

## 2018-04-23 ENCOUNTER — Encounter: Payer: Self-pay | Admitting: *Deleted

## 2018-04-23 VITALS — BP 110/70 | HR 83 | Temp 98.4°F | Wt 164.0 lb

## 2018-04-23 DIAGNOSIS — J069 Acute upper respiratory infection, unspecified: Secondary | ICD-10-CM

## 2018-04-23 DIAGNOSIS — J029 Acute pharyngitis, unspecified: Secondary | ICD-10-CM | POA: Diagnosis not present

## 2018-04-23 MED ORDER — AZITHROMYCIN 250 MG PO TABS
ORAL_TABLET | ORAL | 0 refills | Status: DC
Start: 2018-04-23 — End: 2018-08-12

## 2018-04-23 MED ORDER — METHYLPREDNISOLONE ACETATE 80 MG/ML IJ SUSP
80.0000 mg | Freq: Once | INTRAMUSCULAR | Status: AC
  Administered 2018-04-23: 80 mg via INTRAMUSCULAR

## 2018-04-23 NOTE — Patient Instructions (Signed)
Upper Respiratory Infection, Adult Most upper respiratory infections (URIs) are caused by a virus. A URI affects the nose, throat, and upper air passages. The most common type of URI is often called "the common cold." Follow these instructions at home:  Take medicines only as told by your doctor.  Gargle warm saltwater or take cough drops to comfort your throat as told by your doctor.  Use a warm mist humidifier or inhale steam from a shower to increase air moisture. This may make it easier to breathe.  Drink enough fluid to keep your pee (urine) clear or pale yellow.  Eat soups and other clear broths.  Have a healthy diet.  Rest as needed.  Go back to work when your fever is gone or your doctor says it is okay. ? You may need to stay home longer to avoid giving your URI to others. ? You can also wear a face mask and wash your hands often to prevent spread of the virus.  Use your inhaler more if you have asthma.  Do not use any tobacco products, including cigarettes, chewing tobacco, or electronic cigarettes. If you need help quitting, ask your doctor. Contact a doctor if:  You are getting worse, not better.  Your symptoms are not helped by medicine.  You have chills.  You are getting more short of breath.  You have brown or red mucus.  You have yellow or brown discharge from your nose.  You have pain in your face, especially when you bend forward.  You have a fever.  You have puffy (swollen) neck glands.  You have pain while swallowing.  You have white areas in the back of your throat. Get help right away if:  You have very bad or constant: ? Headache. ? Ear pain. ? Pain in your forehead, behind your eyes, and over your cheekbones (sinus pain). ? Chest pain.  You have long-lasting (chronic) lung disease and any of the following: ? Wheezing. ? Long-lasting cough. ? Coughing up blood. ? A change in your usual mucus.  You have a stiff neck.  You have  changes in your: ? Vision. ? Hearing. ? Thinking. ? Mood. This information is not intended to replace advice given to you by your health care provider. Make sure you discuss any questions you have with your health care provider. Document Released: 05/14/2008 Document Revised: 07/29/2016 Document Reviewed: 03/03/2014 Elsevier Interactive Patient Education  2018 Elsevier Inc.  

## 2018-04-23 NOTE — Progress Notes (Signed)
HPI  Pt presents to the clinic today with c/o runny nose, sore throat and cough. He reports this started 4 days ago. He is blowing clear mucous out of his nose. He denies difficulty swallowing. The cough is productive of green mucous. He denies fever, chills or body aches. He has tried Flonase, Albuterol and Codeine cough syrup with minimal relief. He has a history of allergies and DM 2. He has not had sick contacts but recently did travel via airplane and back.  Review of Systems      Past Medical History:  Diagnosis Date  . Allergy   . Arthritis   . Cholelithiasis    symptomatic  . Diabetes mellitus ([place])   . Family history of malignant neoplasm of gastrointestinal tract   . Fatty liver   . GERD (gastroesophageal reflux disease)   . Graves disease    s/p ablation  . Hemochromatosis   . History of chicken pox   . History of hiatal hernia   . History of kidney stones   . Hyperlipidemia   . Hypertension   . Internal hemorrhoids   . Kidney stones    . Personal history of colonic polyps 03/31/2008   tubular adenoma  . Right fibular fracture 2013    Family History  Problem Relation Age of Onset  . COPD Mother   . Arthritis Mother   . Diabetes Mother   . Hypertension Mother   . Lung cancer Father   . Colon cancer Maternal Grandmother 77  . Prostate cancer Neg Hx   . Esophageal cancer Neg Hx   . Rectal cancer Neg Hx   . Stomach cancer Neg Hx   . Thyroid disease Neg Hx     Social History   Socioeconomic History  . Marital status: Married    Spouse name: Not on file  . Number of children: 1  . Years of education: Not on file  . Highest education level: Not on file  Occupational History  . Occupation: Hydrologist  . Occupation: PROJECT Health visitor: [place]  . Financial resource strain: Not on file  . Food insecurity:    Worry: Not on file    Inability: Not on file  . Transportation needs:    Medical: Not on file    Non-medical: Not on file  Tobacco Use  . Smoking status: Never Smoker  . Smokeless tobacco: Never Used  Substance and Sexual Activity  . Alcohol use: No    Alcohol/week: 0.0 oz  . Drug use: No  . Sexual activity: Yes  Lifestyle  . Physical activity:    Days per week: Not on file    Minutes per session: Not on file  . Stress: Not on file  Relationships  . Social connections:    Talks on phone: Not on file    Gets together: Not on file    Attends religious service: Not on file    Active member of club or organization: Not on file    Attends meetings of clubs or organizations: Not on file    Relationship status: Not on file  . Intimate partner violence:    Fear of current or ex partner: Not on file    Emotionally abused: Not on file    Physically abused: Not on file    Forced sexual activity: Not on file  Other Topics Concern  . Not on file  Social History Narrative  2 years of college   Manager at [employer]   Married 1977   1 daughter    Allergies  Allergen Reactions  . Flomax [Tamsulosin Hcl]     nausea     Constitutional: Denies headache, fatigue, fever or abrupt weight changes.  HEENT:  Positive runny nose, sore throat. Denies eye redness, eye pain, pressure behind the eyes, facial pain, nasal congestion, ear pain, ringing in the ears, wax buildup, or bloody nose. Respiratory: Positive cough. Denies difficulty breathing or shortness of breath.  Cardiovascular: Denies chest pain, chest tightness, palpitations or swelling in the hands or feet.   No other specific complaints in a complete review of systems (except as listed in HPI above).  Objective:   BP 110/70   Pulse 83   Temp 98.4 F (36.9 C) (Oral)   Wt 164 lb (74.4 kg)   SpO2 98%   BMI 27.29 kg/m  Wt Readings from Last 3 Encounters:  04/23/18 164 lb (74.4 kg)  04/07/18 163 lb 9.6 oz (74.2 kg)  03/24/18 158 lb 12 oz (72 kg)     General: Appears his stated age, in NAD. HEENT: Head: normal shape  and size, no sinus tenderness noted; Nose: mucosa pink and moist, septum midline; Throat/Mouth: + PND. Teeth present, mucosa erythematous and moist, no exudate noted, no lesions or ulcerations noted.  Neck: No cervical lymphadenopathy.  Cardiovascular: Normal rate and rhythm. S1,S2 noted.  No murmur, rubs or gallops noted.  Pulmonary/Chest: Normal effort and positive vesicular breath sounds. No respiratory distress. No wheezes, rales or ronchi noted.       Assessment & Plan:   Upper Respiratory Infection:  Get some rest and drink plenty of water Do salt water gargles for the sore throat 80 mg Depo IM today eRx for Azithromax x 5 days Continue Codeine cough syrup  RTC as needed or if symptoms persist.   Webb Silversmith, NP

## 2018-04-24 ENCOUNTER — Encounter: Payer: Self-pay | Admitting: Internal Medicine

## 2018-04-25 ENCOUNTER — Inpatient Hospital Stay: Admission: RE | Admit: 2018-04-25 | Payer: 59 | Source: Ambulatory Visit

## 2018-05-07 ENCOUNTER — Ambulatory Visit
Admission: RE | Admit: 2018-05-07 | Discharge: 2018-05-07 | Disposition: A | Discharge status: Home / Self Care | Payer: 59 | Source: Ambulatory Visit | Attending: Family Medicine | Admitting: Family Medicine

## 2018-05-07 DIAGNOSIS — K862 Cyst of pancreas: Secondary | ICD-10-CM | POA: Diagnosis not present

## 2018-05-07 DIAGNOSIS — K449 Diaphragmatic hernia without obstruction or gangrene: Secondary | ICD-10-CM | POA: Diagnosis not present

## 2018-05-07 MED ORDER — GADOBENATE DIMEGLUMINE 529 MG/ML IV SOLN
15.0000 mL | Freq: Once | INTRAVENOUS | Status: AC | PRN
  Administered 2018-05-07: 15 mL via INTRAVENOUS

## 2018-05-09 ENCOUNTER — Encounter: Payer: Self-pay | Admitting: Family Medicine

## 2018-05-09 ENCOUNTER — Telehealth: Payer: Self-pay | Admitting: Endocrinology

## 2018-05-09 DIAGNOSIS — K862 Cyst of pancreas: Secondary | ICD-10-CM | POA: Insufficient documentation

## 2018-05-09 MED ORDER — INSULIN LISPRO 100 UNIT/ML (KWIKPEN): PEN_INJECTOR | SUBCUTANEOUS | 11 refills | Status: DC

## 2018-05-09 NOTE — Telephone Encounter (Signed)
insulin lispro (HUMALOG KWIKPEN) 100 UNIT/ML KiwkPen   Patient needs this sent into the pharmacy He states that we need to call into the pharmacy because they need to explain to use what wording needs to be on the prescription for them to refill this.  He has one vial left.    [id number] [place], [place].

## 2018-05-09 NOTE — Telephone Encounter (Signed)
I called pharmacy- they states rx needs to state "Or as directed" so patient can receive a full box for 30 days. New Rx sent. See meds.

## 2018-05-20 ENCOUNTER — Ambulatory Visit (INDEPENDENT_AMBULATORY_CARE_PROVIDER_SITE_OTHER): Payer: 59

## 2018-05-20 DIAGNOSIS — E538 Deficiency of other specified B group vitamins: Secondary | ICD-10-CM

## 2018-05-20 MED ORDER — CYANOCOBALAMIN 1000 MCG/ML IJ SOLN
1000.0000 ug | Freq: Once | INTRAMUSCULAR | Status: AC
  Administered 2018-05-20: 1000 ug via INTRAMUSCULAR

## 2018-05-20 NOTE — Progress Notes (Signed)
Per orders of Dr. Duncan, injection of Vit B 12 given by Rena Isley. Patient tolerated injection well. 

## 2018-05-21 NOTE — Progress Notes (Signed)
Agree. Thanks

## 2018-06-24 ENCOUNTER — Ambulatory Visit (INDEPENDENT_AMBULATORY_CARE_PROVIDER_SITE_OTHER): Payer: 59 | Admitting: *Deleted

## 2018-06-24 ENCOUNTER — Telehealth: Payer: Self-pay | Admitting: Family Medicine

## 2018-06-24 DIAGNOSIS — E538 Deficiency of other specified B group vitamins: Secondary | ICD-10-CM | POA: Diagnosis not present

## 2018-06-24 MED ORDER — CYANOCOBALAMIN 1000 MCG/ML IJ SOLN
1000.0000 ug | Freq: Once | INTRAMUSCULAR | Status: AC
  Administered 2018-06-24: 1000 ug via INTRAMUSCULAR

## 2018-06-24 NOTE — Telephone Encounter (Signed)
Ferritin last checked on 01/20/18, pt said Dr Henrene Pastor has noty contacted him to have levels rechecked. He is requesting to have orders put in to have labs at Dr. Blanch Media office. Please call to set up.

## 2018-06-24 NOTE — Telephone Encounter (Signed)
Pt said Dr. Henrene Pastor  Has not contacted pt to have his ferratin levels checked

## 2018-06-25 NOTE — Telephone Encounter (Signed)
Lugene- I ordered the CBC and Ferritin.  Would he like to have it done here or f/u with GI?  Let me know.   If done here, he can get them done with next B12 injection at [facility].   Thanks.   I routed this over to Dr. Henrene Pastor as a FYI.

## 2018-06-25 NOTE — Telephone Encounter (Signed)
See below.  He can get the labs done here and then f/u with GI.  Let me know if he needs another referral.  Thanks.

## 2018-06-25 NOTE — Telephone Encounter (Signed)
The patient is 3 years overdue for his previously recommended routine office follow-up with GI. Happy to see him back, if he would like. No need to phlebotomize with low ferritin level. Thanks

## 2018-06-26 NOTE — Telephone Encounter (Signed)
Left detailed message on voicemail.  

## 2018-07-29 ENCOUNTER — Other Ambulatory Visit (INDEPENDENT_AMBULATORY_CARE_PROVIDER_SITE_OTHER): Payer: 59

## 2018-07-29 ENCOUNTER — Ambulatory Visit (INDEPENDENT_AMBULATORY_CARE_PROVIDER_SITE_OTHER): Payer: 59 | Admitting: *Deleted

## 2018-07-29 DIAGNOSIS — E538 Deficiency of other specified B group vitamins: Secondary | ICD-10-CM | POA: Diagnosis not present

## 2018-07-29 LAB — CBC WITH DIFFERENTIAL/PLATELET
BASOS PCT: 0.5 % (ref 0.0–3.0)
Basophils Absolute: 0 10*3/uL (ref 0.0–0.1)
Eosinophils Absolute: 0.2 10*3/uL (ref 0.0–0.7)
Eosinophils Relative: 4.2 % (ref 0.0–5.0)
HEMATOCRIT: 42.5 % (ref 39.0–52.0)
Hemoglobin: 14.6 g/dL (ref 13.0–17.0)
LYMPHS PCT: 29.7 % (ref 12.0–46.0)
Lymphs Abs: 1.3 10*3/uL (ref 0.7–4.0)
MCHC: 34.3 g/dL (ref 30.0–36.0)
MCV: 93.8 fl (ref 78.0–100.0)
MONO ABS: 0.5 10*3/uL (ref 0.1–1.0)
Monocytes Relative: 12 % (ref 3.0–12.0)
NEUTROS ABS: 2.3 10*3/uL (ref 1.4–7.7)
Neutrophils Relative %: 53.6 % (ref 43.0–77.0)
PLATELETS: 189 10*3/uL (ref 150.0–400.0)
RBC: 4.53 Mil/uL (ref 4.22–5.81)
RDW: 13.5 % (ref 11.5–15.5)
WBC: 4.4 10*3/uL (ref 4.0–10.5)

## 2018-07-29 LAB — FERRITIN: Ferritin: 19.5 ng/mL — ABNORMAL LOW (ref 22.0–322.0)

## 2018-07-29 MED ORDER — CYANOCOBALAMIN 1000 MCG/ML IJ SOLN
1000.0000 ug | Freq: Once | INTRAMUSCULAR | Status: AC
  Administered 2018-07-29: 1000 ug via INTRAMUSCULAR

## 2018-07-29 NOTE — Progress Notes (Signed)
Per orders of Dr. Damita Dunnings, injection of B12 1000 mcg given by Codee Tutson. Patient tolerated injection well.

## 2018-08-04 DIAGNOSIS — E119 Type 2 diabetes mellitus without complications: Secondary | ICD-10-CM | POA: Diagnosis not present

## 2018-08-04 LAB — HM DIABETES EYE EXAM

## 2018-08-12 ENCOUNTER — Encounter: Payer: Self-pay | Admitting: Optometry

## 2018-08-12 ENCOUNTER — Encounter: Payer: Self-pay | Admitting: Internal Medicine

## 2018-08-12 ENCOUNTER — Ambulatory Visit: Payer: 59 | Admitting: Internal Medicine

## 2018-08-12 DIAGNOSIS — K219 Gastro-esophageal reflux disease without esophagitis: Secondary | ICD-10-CM | POA: Diagnosis not present

## 2018-08-12 DIAGNOSIS — R1013 Epigastric pain: Secondary | ICD-10-CM

## 2018-08-12 DIAGNOSIS — K862 Cyst of pancreas: Secondary | ICD-10-CM | POA: Diagnosis not present

## 2018-08-12 NOTE — Progress Notes (Signed)
HISTORY OF PRESENT ILLNESS:  Ryan Crane is a 62 y.o. male , previous patient of Dr. Verl Blalock, who had been followed in this office for GERD, hereditary hemachromatosis for which he has undergone phlebotomy, and colon cancer screening. I saw the patient on 1 occasion 05/10/2014. See that dictation for details. At that time his liver tests were normal and ferritin low-normal. He was continued on omeprazole for GERD. He was up-to-date with his endoscopic evaluations including normal screening colonoscopy in 2013. He was anticipating phlebotomy. He was told to follow-up with me in one year but did not. He has not been seen since. He recently saw his CBC who referred him over here for resumption of GI care, particularly as relates to his hemachromatosis. Patient tells me that he is on a very restricted diet secondary to hemachromatosis and diabetes. He wishes to expand his diet to include some meat and shellfish. Last year the patient complained of intermittent epigastric pain for which she was noted to have gallstones and underwent cholecystectomy without relief. Patient tells me that he is continued with the same epigastric pain. He describes this occurring once per week and lasting only 30 seconds and is predictably improved with stretching movement. Dr. Damita Dunnings has also been following a small pancreatic cyst with imaging. His last MRI was 05/07/2018 that revealed a 8 x 6 x 19 mm pancreatic head lesion without communication with the main pancreatic duct. Follow-up in 12 months with imaging recommended. Review of blood work from earlier this year shows low ferritin of 18.1. Normal hemoglobin of 15.3. Normal liver tests. Patient uses omeprazole on demand to control his reflux symptoms. No dysphagia. GI review of systems is otherwise entirely negative. No interval family history of colon cancer.  REVIEW OF SYSTEMS:  All non-GI ROS negative unless otherwise stated in the history of present illness except  for arthritis, visual change,  Past Medical History:  Diagnosis Date  . Allergy   . Arthritis   . Cholelithiasis    symptomatic  . Diabetes mellitus ([place])   . Family history of malignant neoplasm of gastrointestinal tract   . Fatty liver   . GERD (gastroesophageal reflux disease)   . Graves disease    s/p ablation  . Hemochromatosis   . History of chicken pox   . History of hiatal hernia   . History of kidney stones   . Hyperlipidemia   . Hypertension   . Internal hemorrhoids   . Kidney stones    . Personal history of colonic polyps 03/31/2008   tubular adenoma  . Right fibular fracture 2013    Past Surgical History:  Procedure Laterality Date  . CHOLECYSTECTOMY N/A 05/29/2017   Procedure: LAPAROSCOPIC CHOLECYSTECTOMY;  Surgeon: Clovis Riley, MD;  Location: [facility];  Service: General;  Laterality: N/A;  . COLONOSCOPY W/ BIOPSIES AND POLYPECTOMY    . LITHOTRIPSY    . URETERAL STENT PLACEMENT     left    Social History Ryan Crane  reports that he has never smoked. He has never used smokeless tobacco. He reports that he does not drink alcohol or use drugs.  family history includes Arthritis in his mother; COPD in his mother; Colon cancer (age of onset: 39) in his maternal grandmother; Diabetes in his mother; Hypertension in his mother; Lung cancer in his father.  Allergies  Allergen Reactions  . Flomax [Tamsulosin Hcl]     nausea       PHYSICAL EXAMINATION: Vital signs: BP 104/62 (  BP Location: Left Arm, Patient Position: Sitting, Cuff Size: Normal)   Pulse 60   Ht 5' 6.5" (1.689 m)   Wt 170 lb 2 oz (77.2 kg)   BMI 27.05 kg/m   Constitutional: generally well-appearing, no acute distress Psychiatric: alert and oriented x3, cooperative Eyes: extraocular movements intact, anicteric, conjunctiva pink Mouth: oral pharynx moist, no lesions Neck: supple no lymphadenopathy Cardiovascular: heart regular rate and rhythm, no murmur Lungs: clear to auscultation  bilaterally Abdomen: soft, nontender, nondistended, no obvious ascites, no peritoneal signs, normal bowel sounds, no organomegaly Rectal:omitted Extremities: no clubbing, cyanosis, or lower extremity edema bilaterally Skin: no lesions on visible extremities Neuro: No focal deficits. No asterixis. Cranial nerves intact  ASSESSMENT:  #1. Hereditary hemochromatosis. Currently does not require phlebotomy with low ferritin, normal hemoglobin, and normal liver tests #2. Intermittent epigastric pain as described most consistent with musculoskeletal #3. GERD. The patient takes on demand PPI which controls his symptoms. No dysphagia #4. Small pancreatic lesion pain followed with advanced imaging under the supervision of Dr. Damita Dunnings #5. Colon cancer screening. Up-to-date   PLAN:  #1. No need for phlebotomy at this time. We discussed parameters #2. Reassurance regarding musculoskeletal type pain #3. Reflux precautions #4. On demand PPI #5. Ongoing monitoring of pancreatic lesion with Dr. Damita Dunnings. If this were to significantly change, then he may need EUS #6. Screening colonoscopy around 2023 #7. Repeat laboratories including CBC, ferritin, and liver tests next spring with Dr. Damita Dunnings at time of his routine follow-up #8. Routine GI follow-up one year #9. Okay to liberalize diet a bit

## 2018-09-02 ENCOUNTER — Ambulatory Visit (INDEPENDENT_AMBULATORY_CARE_PROVIDER_SITE_OTHER): Payer: 59 | Admitting: Emergency Medicine

## 2018-09-02 DIAGNOSIS — E538 Deficiency of other specified B group vitamins: Secondary | ICD-10-CM | POA: Diagnosis not present

## 2018-09-02 MED ORDER — CYANOCOBALAMIN 1000 MCG/ML IJ SOLN
1000.0000 ug | Freq: Once | INTRAMUSCULAR | Status: AC
  Administered 2018-09-02: 1000 ug via INTRAMUSCULAR

## 2018-09-02 NOTE — Progress Notes (Signed)
Please see when patient is going to f/u with endocrine.  I don't see f/u set about that.  Thanks.

## 2018-09-02 NOTE — Progress Notes (Signed)
Per orders of Dr.Duncan, injection of b12 given by Elmon Kirschner. Patient tolerated injection well. Patient received injection in right deltoid.

## 2018-09-04 NOTE — Progress Notes (Signed)
Patient states he does not have a f/u scheduled at this time but will call them to get an appointment if you think it is necessary.

## 2018-09-04 NOTE — Progress Notes (Signed)
Yes, due for f/u with endo.  Thanks.

## 2018-09-10 ENCOUNTER — Ambulatory Visit: Payer: 59 | Admitting: Endocrinology

## 2018-09-10 ENCOUNTER — Encounter: Payer: Self-pay | Admitting: Endocrinology

## 2018-09-10 VITALS — BP 126/76 | HR 65 | Ht 66.5 in | Wt 170.8 lb

## 2018-09-10 DIAGNOSIS — E119 Type 2 diabetes mellitus without complications: Secondary | ICD-10-CM | POA: Diagnosis not present

## 2018-09-10 LAB — POCT GLYCOSYLATED HEMOGLOBIN (HGB A1C): HEMOGLOBIN A1C: 5.9 % — AB (ref 4.0–5.6)

## 2018-09-10 NOTE — Progress Notes (Signed)
Subjective:    Patient ID: Ryan Crane, male    DOB: [birth year], 62 y.o.   MRN: [id number]  HPI Pt returns for f/u of diabetes mellitus:  DM type: 1 Dx'ed: 8242 Complications: none Therapy: insulin since 2019, and januvia.  DKA: never.  Severe hypoglycemia: never Pancreatitis: never Pancreatic imaging: no mention is made on 2018 abd [place].  Other: he takes multiple daily injection; he declines pump and continuous glucose monitor for now.    Interval history:  pt states he feels well in general.  Meter is downloaded today, and the printout is scanned into the record.  cbg varies from 50-270.  There is no trend throughout the day. He takes basaglar, 22 units qhs, and humalog, 3 times a day (just before each meal) 10-20-12 units.  It is lower pc than ac.   Past Medical History:  Diagnosis Date  . Allergy   . Arthritis   . Cholelithiasis    symptomatic  . Diabetes mellitus ([place])   . Family history of malignant neoplasm of gastrointestinal tract   . Fatty liver   . GERD (gastroesophageal reflux disease)   . Graves disease    s/p ablation  . Hemochromatosis   . History of chicken pox   . History of hiatal hernia   . History of kidney stones   . Hyperlipidemia   . Hypertension   . Internal hemorrhoids   . Kidney stones    . Personal history of colonic polyps 03/31/2008   tubular adenoma  . Right fibular fracture 2013    Past Surgical History:  Procedure Laterality Date  . CHOLECYSTECTOMY N/A 05/29/2017   Procedure: LAPAROSCOPIC CHOLECYSTECTOMY;  Surgeon: Clovis Riley, MD;  Location: [facility];  Service: General;  Laterality: N/A;  . COLONOSCOPY W/ BIOPSIES AND POLYPECTOMY    . LITHOTRIPSY    . URETERAL STENT PLACEMENT     left    Social History   Socioeconomic History  . Marital status: Married    Spouse name: Not on file  . Number of children: 1  . Years of education: Not on file  . Highest education level: Not on file  Occupational History  . Occupation:  Hydrologist  . Occupation: PROJECT Health visitor: [employer]  . Financial resource strain: Not on file  . Food insecurity:    Worry: Not on file    Inability: Not on file  . Transportation needs:    Medical: Not on file    Non-medical: Not on file  Tobacco Use  . Smoking status: Never Smoker  . Smokeless tobacco: Never Used  Substance and Sexual Activity  . Alcohol use: No    Alcohol/week: 0.0 standard drinks  . Drug use: No  . Sexual activity: Yes  Lifestyle  . Physical activity:    Days per week: Not on file    Minutes per session: Not on file  . Stress: Not on file  Relationships  . Social connections:    Talks on phone: Not on file    Gets together: Not on file    Attends religious service: Not on file    Active member of club or organization: Not on file    Attends meetings of clubs or organizations: Not on file    Relationship status: Not on file  . Intimate partner violence:    Fear of current or ex partner: Not on file  Emotionally abused: Not on file    Physically abused: Not on file    Forced sexual activity: Not on file  Other Topics Concern  . Not on file  Social History Narrative   2 years of college   Manager at [employer]   Married 1977   1 daughter    Current Outpatient Medications on File Prior to Visit  Medication Sig Dispense Refill  . albuterol (VENTOLIN HFA) 108 (90 Base) MCG/ACT inhaler INHALE 1-2 PUFFS INTO THE LUNGS EVERY 6 (SIX) HOURS AS NEEDED FOR WHEEZING OR SHORTNESS OF BREATH. 18 Inhaler 3  . aspirin 81 MG tablet Take 81 mg by mouth daily.      . Blood Glucose Monitoring Suppl (ONETOUCH VERIO) w/Device KIT 1 kit by Does not apply route daily. Use to test blood sugar once daily Dx E11.9 1 kit 0  . cyanocobalamin (,VITAMIN B-12,) 1000 MCG/ML injection Inject 1,000 mcg into the muscle every 30 (thirty) days.    . fenofibrate 160 MG tablet TAKE 1 TABLET BY MOUTH EVERY DAY 30 tablet 11  .  fluticasone (FLONASE) 50 MCG/ACT nasal spray Place 1 spray into both nostrils daily.    Marland Kitchen glucose blood (ONETOUCH VERIO) test strip USE TO TEST BLOOD SUGARS AS DIRECTED 7 TIMES A DAY. 600 each 3  . guaiFENesin-codeine (CHERATUSSIN AC) 100-10 MG/5ML syrup Take 5 mLs 2 (two) times daily as needed by mouth for cough. 120 mL 0  . Insulin Glargine (BASAGLAR KWIKPEN) 100 UNIT/ML SOPN Inject 0.14 mLs (14 Units total) into the skin at bedtime. (Patient taking differently: Inject 22 Units into the skin at bedtime. )    . insulin lispro (HUMALOG KWIKPEN) 100 UNIT/ML KiwkPen 3 times a day (just before each meal) 10-20-12 units OR as directed, And pen needles 1/day 15 mL 11  . levothyroxine (SYNTHROID, LEVOTHROID) 150 MCG tablet Take 1 tablet (150 mcg total) by mouth daily before breakfast. 30 tablet 11  . lisinopril (PRINIVIL,ZESTRIL) 20 MG tablet TAKE 1 TABLET (20 MG TOTAL) BY MOUTH DAILY. 30 tablet 12  . omeprazole (PRILOSEC) 40 MG capsule TAKE 1 CAPSULE (40 MG TOTAL) BY MOUTH DAILY AS NEEDED 30 capsule 2  . ONETOUCH DELICA LANCETS 42H MISC USE TO TEST BLOOD SUGARS 7 TIMES DAILY 700 each 1   No current facility-administered medications on file prior to visit.     Allergies  Allergen Reactions  . Flomax [Tamsulosin Hcl]     nausea    Family History  Problem Relation Age of Onset  . COPD Mother   . Arthritis Mother   . Diabetes Mother   . Hypertension Mother   . Lung cancer Father   . Colon cancer Maternal Grandmother 78  . Prostate cancer Neg Hx   . Esophageal cancer Neg Hx   . Rectal cancer Neg Hx   . Stomach cancer Neg Hx   . Thyroid disease Neg Hx     BP 126/76 (BP Location: Right Arm, Patient Position: Sitting, Cuff Size: Normal)   Pulse 65   Ht 5' 6.5" (1.689 m)   Wt 170 lb 12.8 oz (77.5 kg)   SpO2 97%   BMI 27.15 kg/m    Review of Systems Denies LOC.      Objective:   Physical Exam VITAL SIGNS:  See vs page GENERAL: no distress Pulses: dorsalis pedis intact bilat.     MSK: no deformity of the feet CV: 1+ bilat leg edema Skin:  no ulcer on the feet.  normal color  and temp on the feet. Neuro: sensation is intact to touch on the feet  A1c=5.9%     Assessment & Plan:  Type 1 DM: overcontrolled  Patient Instructions  check your blood sugar 4 times a day: before the 3 meals, and at bedtime.  also check if you have symptoms of your blood sugar being too high or too low.  please keep a record of the readings and bring it to your next appointment here (or you can bring the meter itself).  You can write it on any piece of paper.  please call us sooner if your blood sugar goes below 70, or if you have a lot of readings over 200. Please reduce the humalog to 3 times a day (just before each meal) 10-20-12 units, and: Please continue the same basaglar.  Please come back for a follow-up appointment in 4 months.

## 2018-09-10 NOTE — Patient Instructions (Addendum)
check your blood sugar 4 times a day: before the 3 meals, and at bedtime.  also check if you have symptoms of your blood sugar being too high or too low.  please keep a record of the readings and bring it to your next appointment here (or you can bring the meter itself).  You can write it on any piece of paper.  please call us sooner if your blood sugar goes below 70, or if you have a lot of readings over 200. Please reduce the humalog to 3 times a day (just before each meal) 10-20-12 units, and: Please continue the same basaglar.  Please come back for a follow-up appointment in 4 months.

## 2018-10-03 ENCOUNTER — Other Ambulatory Visit: Payer: Self-pay | Admitting: Endocrinology

## 2018-10-07 ENCOUNTER — Ambulatory Visit (INDEPENDENT_AMBULATORY_CARE_PROVIDER_SITE_OTHER): Payer: 59

## 2018-10-07 DIAGNOSIS — E538 Deficiency of other specified B group vitamins: Secondary | ICD-10-CM | POA: Diagnosis not present

## 2018-10-07 MED ORDER — CYANOCOBALAMIN 1000 MCG/ML IJ SOLN
1000.0000 ug | Freq: Once | INTRAMUSCULAR | Status: AC
  Administered 2018-10-07: 1000 ug via INTRAMUSCULAR

## 2018-10-07 NOTE — Progress Notes (Signed)
Administered monthly B12 injection today. Patient tolerated well.

## 2018-11-04 ENCOUNTER — Encounter: Payer: Self-pay | Admitting: Family Medicine

## 2018-11-04 ENCOUNTER — Ambulatory Visit (INDEPENDENT_AMBULATORY_CARE_PROVIDER_SITE_OTHER)
Admission: RE | Admit: 2018-11-04 | Discharge: 2018-11-04 | Disposition: A | Discharge status: Home / Self Care | Payer: 59 | Source: Ambulatory Visit | Attending: Family Medicine | Admitting: Family Medicine

## 2018-11-04 ENCOUNTER — Ambulatory Visit: Payer: 59 | Admitting: Family Medicine

## 2018-11-04 VITALS — BP 110/72 | HR 76 | Temp 97.9°F | Ht 66.5 in | Wt 172.8 lb

## 2018-11-04 DIAGNOSIS — M79646 Pain in unspecified finger(s): Secondary | ICD-10-CM

## 2018-11-04 DIAGNOSIS — R361 Hematospermia: Secondary | ICD-10-CM

## 2018-11-04 DIAGNOSIS — M19041 Primary osteoarthritis, right hand: Secondary | ICD-10-CM | POA: Diagnosis not present

## 2018-11-04 LAB — POC URINALSYSI DIPSTICK (AUTOMATED)
Bilirubin, UA: NEGATIVE
Blood, UA: NEGATIVE
Glucose, UA: NEGATIVE
KETONES UA: NEGATIVE
Leukocytes, UA: NEGATIVE
Nitrite, UA: NEGATIVE
PH UA: 6 (ref 5.0–8.0)
PROTEIN UA: NEGATIVE
Urobilinogen, UA: 0.2 E.U./dL

## 2018-11-04 MED ORDER — DICLOFENAC SODIUM 1 % TD GEL: 2.0000 g | Freq: Four times a day (QID) | TRANSDERMAL | 12 refills | Status: AC | PRN

## 2018-11-04 MED ORDER — BASAGLAR KWIKPEN 100 UNIT/ML ~~LOC~~ SOPN: 26.0000 [IU] | PEN_INJECTOR | Freq: Every day | SUBCUTANEOUS | Status: DC

## 2018-11-04 MED ORDER — INSULIN LISPRO 100 UNIT/ML ~~LOC~~ SOLN: SUBCUTANEOUS | 11 refills | Status: DC

## 2018-11-04 NOTE — Patient Instructions (Signed)
Go to the lab on the way out.  We'll contact you with your xray report. Use the gel if needed.  See if that helps.  We may need to get you over to the hand clinic.   Take care.  Glad to see you.

## 2018-11-04 NOTE — Progress Notes (Signed)
2 episodes in the last 2 weeks with blood in his semen.  No burning with urination.  No sx o/w.  No other bleeding, no blood in stool.  On asprin 81mg .  No other blood thinners.  No FCNAVD.  No discharge, no testicle pain.    He is having increasing IP joint pain, bilaterally.  Dec ROM with flexion in the B 2nd fingers.   Meds, vitals, and allergies reviewed.   ROS: Per HPI unless specifically indicated in ROS section   GEN: nad, alert and oriented HEENT: mucous membranes moist NECK: supple w/o LA CV: rrr. PULM: ctab, no inc wob ABD: soft, +bs EXT: no edema SKIN: Well-perfused Bilateral hands with similar exam.  Sensation intact.  Normal capillary refill.  Normal radial pulses.  He has diffuse IP joint changes noted bilaterally but especially on the bilateral second PIP and DIP joints.  No acute redness or synovitis noted.

## 2018-11-05 DIAGNOSIS — R361 Hematospermia: Secondary | ICD-10-CM | POA: Insufficient documentation

## 2018-11-05 DIAGNOSIS — M79646 Pain in unspecified finger(s): Secondary | ICD-10-CM | POA: Insufficient documentation

## 2018-11-05 NOTE — Assessment & Plan Note (Signed)
He had read up on this prior to the office visit.  We discussed the differential.  It is usually a self-limited and benign issue.  He understood that.  Reasonable to check urinalysis and if that is normal and if his symptoms resolve then no other work-up needed.  He agreed.  Urinalysis is unremarkable today.  If he has continued symptoms or other concerns and we can refer to urology if needed.  He will update me as needed.  He agrees with plan.

## 2018-11-05 NOTE — Assessment & Plan Note (Addendum)
It looks like he has bilateral osteoarthritis but it is reasonable to check a plain film today.  See notes on imaging.  No acute trauma.  He has some restriction in bilateral second finger flexion due to chronic joint changes.  He would like to improve this is much as possible so he can play golf with his grandson.  Reasonable to try diclofenac gel in the meantime.  Use as needed.

## 2018-11-11 ENCOUNTER — Ambulatory Visit (INDEPENDENT_AMBULATORY_CARE_PROVIDER_SITE_OTHER): Payer: 59 | Admitting: *Deleted

## 2018-11-11 DIAGNOSIS — E538 Deficiency of other specified B group vitamins: Secondary | ICD-10-CM

## 2018-11-11 MED ORDER — CYANOCOBALAMIN 1000 MCG/ML IJ SOLN
1000.0000 ug | Freq: Once | INTRAMUSCULAR | Status: AC
  Administered 2018-11-11: 1000 ug via INTRAMUSCULAR

## 2018-11-11 NOTE — Progress Notes (Signed)
Per orders of Dr. Duncan, injection of b12 given by Ramonita Koenig H. Patient tolerated injection well.  

## 2018-11-26 ENCOUNTER — Other Ambulatory Visit: Payer: Self-pay | Admitting: Endocrinology

## 2018-12-17 ENCOUNTER — Ambulatory Visit: Payer: 59

## 2018-12-18 ENCOUNTER — Ambulatory Visit: Payer: 59

## 2018-12-23 ENCOUNTER — Ambulatory Visit (INDEPENDENT_AMBULATORY_CARE_PROVIDER_SITE_OTHER): Payer: 59 | Admitting: *Deleted

## 2018-12-23 DIAGNOSIS — E538 Deficiency of other specified B group vitamins: Secondary | ICD-10-CM | POA: Diagnosis not present

## 2018-12-23 MED ORDER — CYANOCOBALAMIN 1000 MCG/ML IJ SOLN
1000.0000 ug | Freq: Once | INTRAMUSCULAR | Status: AC
  Administered 2018-12-23: 1000 ug via INTRAMUSCULAR

## 2018-12-23 NOTE — Progress Notes (Signed)
Per orders of Dr. Duncan, injection of b12 given by Solana Coggin H. Patient tolerated injection well.  

## 2018-12-27 ENCOUNTER — Other Ambulatory Visit: Payer: Self-pay | Admitting: Family Medicine

## 2018-12-30 ENCOUNTER — Other Ambulatory Visit: Payer: Self-pay | Admitting: Family Medicine

## 2018-12-31 NOTE — Telephone Encounter (Signed)
Patient notified as instructed by telephone and verbalized understanding. Patient stated that the only reason he requested a refill is because the one that he had expired and wanted to have one on hand in case he needed it.

## 2018-12-31 NOTE — Telephone Encounter (Signed)
Sent. Thanks.  If needed frequently then needs office visit for recheck.

## 2018-12-31 NOTE — Telephone Encounter (Signed)
Electronic refill request Ventolin Last office visit 11/04/18 Last refill 10/28/17  18 inhaler/3 refills

## 2019-01-20 ENCOUNTER — Ambulatory Visit: Payer: 59

## 2019-01-27 ENCOUNTER — Ambulatory Visit (INDEPENDENT_AMBULATORY_CARE_PROVIDER_SITE_OTHER): Payer: 59

## 2019-01-27 DIAGNOSIS — E538 Deficiency of other specified B group vitamins: Secondary | ICD-10-CM | POA: Diagnosis not present

## 2019-01-27 MED ORDER — CYANOCOBALAMIN 1000 MCG/ML IJ SOLN
1000.0000 ug | Freq: Once | INTRAMUSCULAR | Status: AC
  Administered 2019-01-27: 1000 ug via INTRAMUSCULAR

## 2019-01-27 NOTE — Progress Notes (Signed)
Per orders of Dr. Duncan, injection of vit B12 given by Xaiden Fleig. Patient tolerated injection well.  

## 2019-02-04 ENCOUNTER — Telehealth: Payer: Self-pay | Admitting: Endocrinology

## 2019-02-05 ENCOUNTER — Ambulatory Visit: Payer: 59 | Admitting: Endocrinology

## 2019-02-05 ENCOUNTER — Encounter: Payer: Self-pay | Admitting: Endocrinology

## 2019-02-05 ENCOUNTER — Other Ambulatory Visit: Payer: Self-pay

## 2019-02-05 VITALS — BP 120/70 | HR 61 | Ht 66.5 in | Wt 174.6 lb

## 2019-02-05 DIAGNOSIS — E10649 Type 1 diabetes mellitus with hypoglycemia without coma: Secondary | ICD-10-CM

## 2019-02-05 DIAGNOSIS — E119 Type 2 diabetes mellitus without complications: Secondary | ICD-10-CM

## 2019-02-05 LAB — POCT GLYCOSYLATED HEMOGLOBIN (HGB A1C): HEMOGLOBIN A1C: 6.9 % — AB (ref 4.0–5.6)

## 2019-02-05 MED ORDER — BASAGLAR KWIKPEN 100 UNIT/ML ~~LOC~~ SOPN: 26.0000 [IU] | PEN_INJECTOR | Freq: Every day | SUBCUTANEOUS | 11 refills | Status: DC

## 2019-02-05 MED ORDER — INSULIN LISPRO (1 UNIT DIAL) 100 UNIT/ML (KWIKPEN): PEN_INJECTOR | SUBCUTANEOUS | 11 refills | Status: DC

## 2019-02-05 NOTE — Patient Instructions (Addendum)
check your blood sugar 4 times a day: before the 3 meals, and at bedtime.  also check if you have symptoms of your blood sugar being too high or too low.  please keep a record of the readings and bring it to your next appointment here (or you can bring the meter itself).  You can write it on any piece of paper.  please call us sooner if your blood sugar goes below 70, or if you have a lot of readings over 200. Please continue the same insulins.  Please come back for a follow-up appointment in 4 months.

## 2019-02-05 NOTE — Progress Notes (Signed)
 Subjective:    Patient ID: Ryan Crane, male    DOB: 04/21/1956, 62 y.o.   MRN: [id number]  HPI Pt returns for f/u of diabetes mellitus:  DM type: 1 Dx'ed: 2013 Complications: none Therapy: insulin since 2019, and januvia.  DKA: never.  Severe hypoglycemia: never Pancreatitis: never Pancreatic imaging: no mention is made on 2018 abd [place].  Other: he takes multiple daily injections; he declines pump and continuous glucose monitor for now.    Interval history:  pt states he feels well in general.  Meter is downloaded today, and the printout is scanned into the record.  cbg varies from 62-320.  There is no trend throughout the day. He takes basaglar, 26 units qhs, and humalog, 3 times a day (just before each meal) 14-14-16 units.   Past Medical History:  Diagnosis Date  . Allergy   . Arthritis   . Cholelithiasis    symptomatic  . Diabetes mellitus ([facility])   . Family history of malignant neoplasm of gastrointestinal tract   . Fatty liver   . GERD (gastroesophageal reflux disease)   . Graves disease    s/p ablation  . Hemochromatosis   . History of chicken pox   . History of hiatal hernia   . History of kidney stones   . Hyperlipidemia   . Hypertension   . Internal hemorrhoids   . Kidney stones    . Personal history of colonic polyps 03/31/2008   tubular adenoma  . Right fibular fracture 2013    Past Surgical History:  Procedure Laterality Date  . CHOLECYSTECTOMY N/A 05/29/2017   Procedure: LAPAROSCOPIC CHOLECYSTECTOMY;  Surgeon: Connor, Chelsea A, MD;  Location: [facility];  Service: General;  Laterality: N/A;  . COLONOSCOPY W/ BIOPSIES AND POLYPECTOMY    . LITHOTRIPSY    . URETERAL STENT PLACEMENT     left    Social History   Socioeconomic History  . Marital status: Married    Spouse name: Not on file  . Number of children: 1  . Years of education: Not on file  . Highest education level: Not on file  Occupational History  . Occupation: PROJECT MANAGER    Employer:  [employer]  . Occupation: PROJECT ENGINE    Employer: [employer]  Social Needs  . Financial resource strain: Not on file  . Food insecurity:    Worry: Not on file    Inability: Not on file  . Transportation needs:    Medical: Not on file    Non-medical: Not on file  Tobacco Use  . Smoking status: Never Smoker  . Smokeless tobacco: Never Used  Substance and Sexual Activity  . Alcohol use: No    Alcohol/week: 0.0 standard drinks  . Drug use: No  . Sexual activity: Yes  Lifestyle  . Physical activity:    Days per week: Not on file    Minutes per session: Not on file  . Stress: Not on file  Relationships  . Social connections:    Talks on phone: Not on file    Gets together: Not on file    Attends religious service: Not on file    Active member of club or organization: Not on file    Attends meetings of clubs or organizations: Not on file    Relationship status: Not on file  . Intimate partner violence:    Fear of current or ex partner: Not on file    Emotionally abused: Not on file      Physically abused: Not on file    Forced sexual activity: Not on file  Other Topics Concern  . Not on file  Social History Narrative   2 years of college   Manager at [employer]   Married 1977   1 daughter    Current Outpatient Medications on File Prior to Visit  Medication Sig Dispense Refill  . albuterol (VENTOLIN HFA) 108 (90 Base) MCG/ACT inhaler INHALE 1-2 PUFFS INTO THE LUNGS EVERY 6 (SIX) HOURS AS NEEDED FOR WHEEZING OR SHORTNESS OF BREATH. 1 Inhaler 3  . aspirin 81 MG tablet Take 81 mg by mouth daily.      . Blood Glucose Monitoring Suppl (ONETOUCH VERIO) w/Device KIT 1 kit by Does not apply route daily. Use to test blood sugar once daily Dx E11.9 1 kit 0  . cyanocobalamin (,VITAMIN B-12,) 1000 MCG/ML injection Inject 1,000 mcg into the muscle every 30 (thirty) days.    . diclofenac sodium (VOLTAREN) 1 % GEL Apply 2 g topically 4 (four) times daily as needed. 100 g 12  . fenofibrate 160  MG tablet TAKE 1 TABLET BY MOUTH EVERY DAY 30 tablet 11  . fluticasone (FLONASE) 50 MCG/ACT nasal spray Place 1 spray into both nostrils daily.    Marland Kitchen glucose blood (ONETOUCH VERIO) test strip USE TO TEST BLOOD SUGARS AS DIRECTED 7 TIMES A DAY. 600 each 3  . Lancets (ONETOUCH DELICA PLUS [id number]) MISC USE TO TEST BLOOD SUGARS 7 TIMES DAILY 200 each 6  . lisinopril (PRINIVIL,ZESTRIL) 20 MG tablet TAKE 1 TABLET (20 MG TOTAL) BY MOUTH DAILY. 30 tablet 12  . omeprazole (PRILOSEC) 40 MG capsule TAKE 1 CAPSULE (40 MG TOTAL) BY MOUTH DAILY AS NEEDED 30 capsule 2  . SYNTHROID 150 MCG tablet TAKE 1 TABLET (150 MCG TOTAL) BY MOUTH DAILY BEFORE BREAKFAST. 30 tablet 11  . guaiFENesin-codeine (CHERATUSSIN AC) 100-10 MG/5ML syrup Take 5 mLs 2 (two) times daily as needed by mouth for cough. (Patient not taking: Reported on 02/05/2019) 120 mL 0   No current facility-administered medications on file prior to visit.     Allergies  Allergen Reactions  . Flomax [Tamsulosin Hcl]     nausea    Family History  Problem Relation Age of Onset  . COPD Mother   . Arthritis Mother   . Diabetes Mother   . Hypertension Mother   . Lung cancer Father   . Colon cancer Maternal Grandmother 4  . Prostate cancer Neg Hx   . Esophageal cancer Neg Hx   . Rectal cancer Neg Hx   . Stomach cancer Neg Hx   . Thyroid disease Neg Hx     BP 120/70 (BP Location: Right Arm, Patient Position: Sitting, Cuff Size: Normal)   Pulse 61   Ht 5' 6.5" (1.689 m)   Wt 174 lb 9.6 oz (79.2 kg)   SpO2 96%   BMI 27.76 kg/m    Review of Systems He denies LOC    Objective:   Physical Exam VITAL SIGNS:  See vs page GENERAL: no distress Pulses: dorsalis pedis intact bilat.   MSK: no deformity of the feet CV: no leg edema Skin:  no ulcer on the feet.  normal color and temp on the feet. Neuro: sensation is intact to touch on the feet   Lab Results  Component Value Date   HGBA1C 6.9 (A) 02/05/2019       Assessment & Plan:    Type 1 DM: well-controlled Hypoglycemia: This limits aggressiveness of  glycemic control  Patient Instructions  check your blood sugar 4 times a day: before the 3 meals, and at bedtime.  also check if you have symptoms of your blood sugar being too high or too low.  please keep a record of the readings and bring it to your next appointment here (or you can bring the meter itself).  You can write it on any piece of paper.  please call us sooner if your blood sugar goes below 70, or if you have a lot of readings over 200. Please continue the same insulins.  Please come back for a follow-up appointment in 4 months.     

## 2019-02-09 ENCOUNTER — Other Ambulatory Visit: Payer: Self-pay | Admitting: Family Medicine

## 2019-02-09 DIAGNOSIS — E119 Type 2 diabetes mellitus without complications: Secondary | ICD-10-CM

## 2019-02-09 DIAGNOSIS — E538 Deficiency of other specified B group vitamins: Secondary | ICD-10-CM

## 2019-02-09 DIAGNOSIS — Z125 Encounter for screening for malignant neoplasm of prostate: Secondary | ICD-10-CM

## 2019-02-10 DIAGNOSIS — E10649 Type 1 diabetes mellitus with hypoglycemia without coma: Secondary | ICD-10-CM | POA: Insufficient documentation

## 2019-02-10 NOTE — Telephone Encounter (Signed)
error 

## 2019-02-11 ENCOUNTER — Telehealth: Payer: Self-pay | Admitting: Endocrinology

## 2019-02-11 NOTE — Telephone Encounter (Signed)
MEDICATION: Insulin Glargine (BASAGLAR KWIKPEN) 100 UNIT/ML SOPN  PHARMACY:  [id number] - [place], [place] - [address]  IS THIS A 90 DAY SUPPLY :   IS PATIENT OUT OF MEDICATION:   IF NOT; HOW MUCH IS LEFT:   LAST APPOINTMENT DATE: @2 /27/2020  NEXT APPOINTMENT DATE:@Visit  date not found  DO WE HAVE YOUR PERMISSION TO LEAVE A DETAILED MESSAGE:  OTHER COMMENTS:  Patient is stating that Dr. Loanne Drilling was supposed to be filling this medication and has not.  **Let patient know to contact pharmacy at the end of the day to make sure medication is ready. **  ** Please notify patient to allow 48-72 hours to process**  **Encourage patient to contact the pharmacy for refills or they can request refills through [facility]**

## 2019-02-12 ENCOUNTER — Other Ambulatory Visit: Payer: Self-pay

## 2019-02-12 DIAGNOSIS — E119 Type 2 diabetes mellitus without complications: Secondary | ICD-10-CM

## 2019-02-12 MED ORDER — BASAGLAR KWIKPEN 100 UNIT/ML ~~LOC~~ SOPN: 26.0000 [IU] | PEN_INJECTOR | Freq: Every day | SUBCUTANEOUS | 1 refills | Status: DC

## 2019-02-12 NOTE — Telephone Encounter (Signed)
Insulin Glargine (BASAGLAR KWIKPEN) 100 UNIT/ML SOPN 5 pen 1 02/12/2019    Sig - Route: Inject 0.26 mLs (26 Units total) into the skin at bedtime. - Subcutaneous   Sent to pharmacy as: Insulin Glargine (BASAGLAR KWIKPEN) 100 UNIT/ML Solution Pen-injector   E-Prescribing Status: Receipt confirmed by pharmacy (02/12/2019 7:51 AM EST)

## 2019-02-18 ENCOUNTER — Telehealth: Payer: Self-pay | Admitting: Endocrinology

## 2019-02-18 NOTE — Telephone Encounter (Signed)
error 

## 2019-02-22 ENCOUNTER — Other Ambulatory Visit: Payer: Self-pay | Admitting: Family Medicine

## 2019-02-24 ENCOUNTER — Ambulatory Visit: Payer: 59

## 2019-02-24 IMAGING — CR DG ORBITS FOR FOREIGN BODY
2 series · 2 of 2 positions shown · non-contrast
Comparison: None.

CLINICAL DATA: Metal working/exposure; clearance prior to MRI

EXAM:
ORBITS FOR FOREIGN BODY - 2 VIEW

[w orbit pa (1 of 2)]
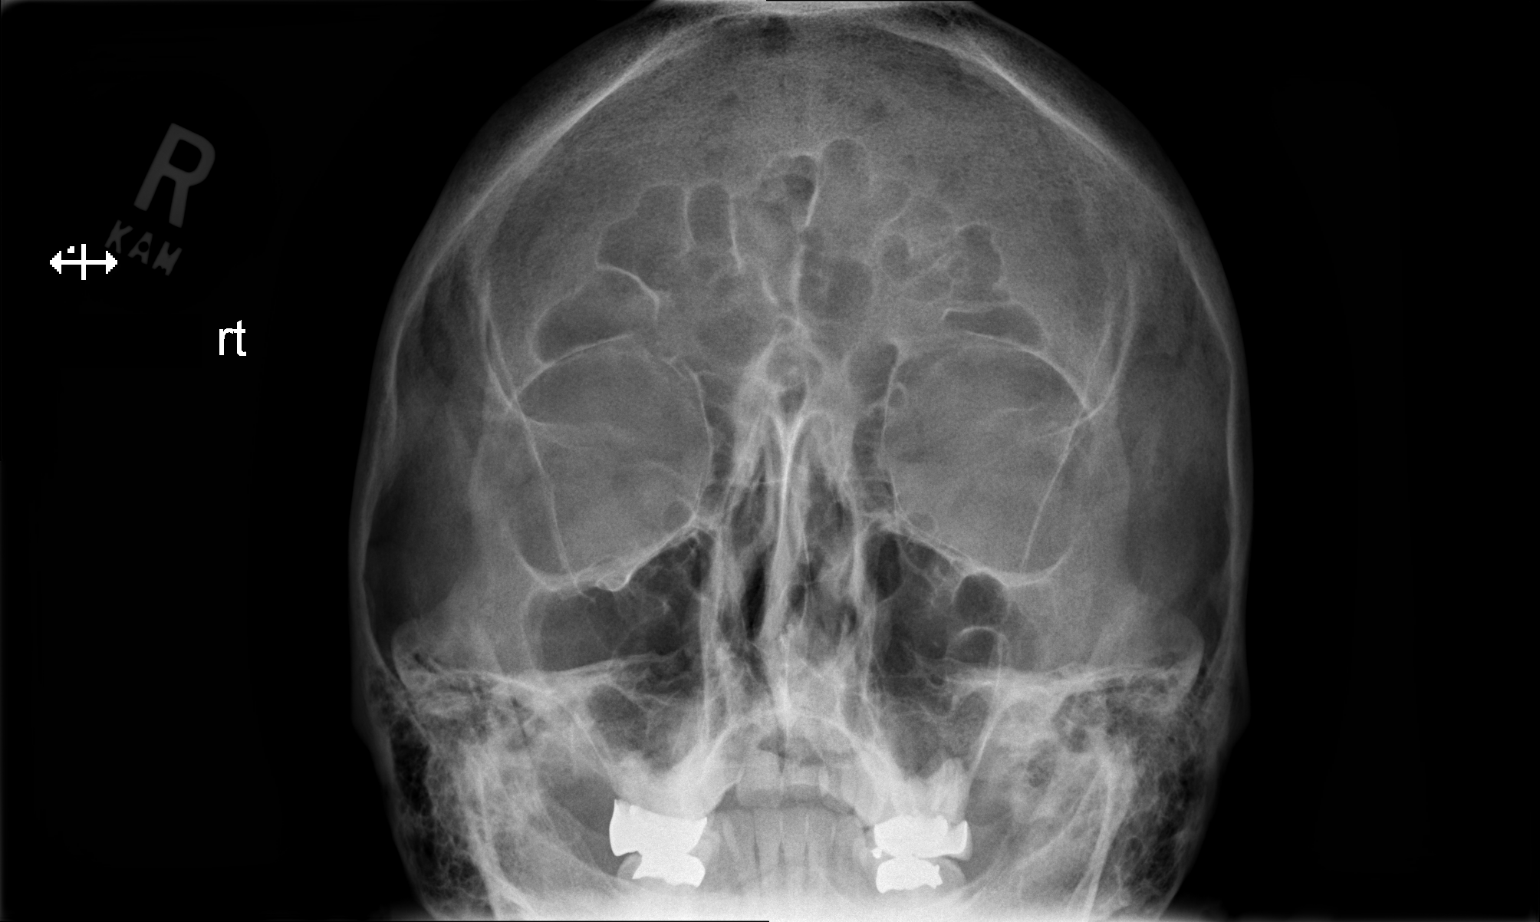

[w orbit pa (2 of 2)]
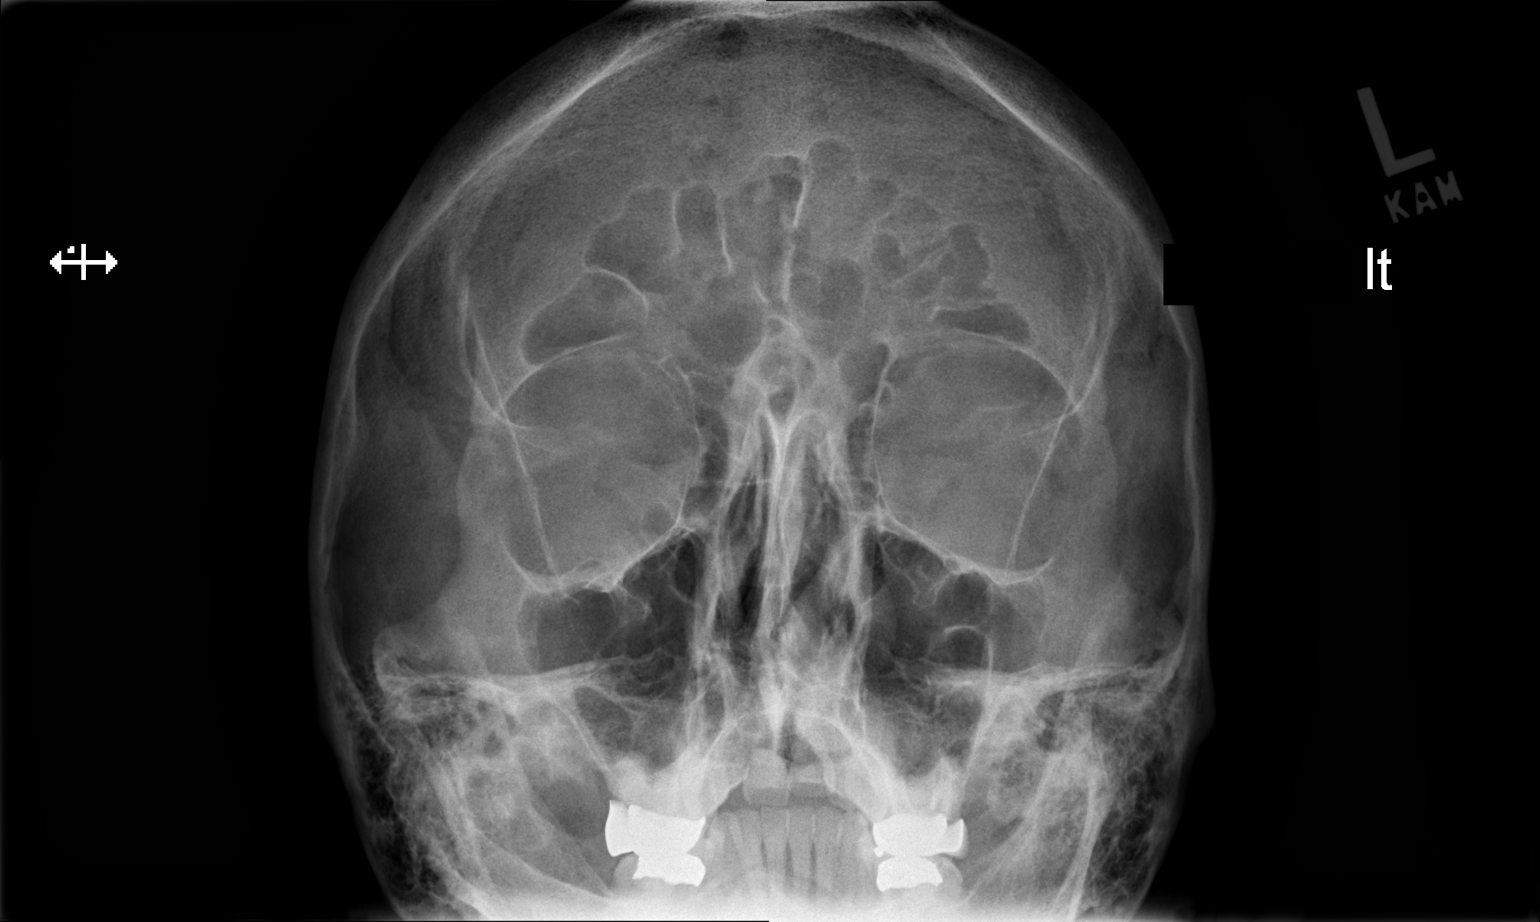

[2 of 2 positions shown; findings below may reference images not displayed]

FINDINGS: There is no evidence of metallic foreign body within the orbits. No
significant bone abnormality identified.
IMPRESSION: No evidence of metallic foreign body within the orbits.

## 2019-02-27 ENCOUNTER — Other Ambulatory Visit: Payer: Self-pay | Admitting: Endocrinology

## 2019-03-03 ENCOUNTER — Ambulatory Visit (INDEPENDENT_AMBULATORY_CARE_PROVIDER_SITE_OTHER): Payer: 59 | Admitting: *Deleted

## 2019-03-03 ENCOUNTER — Other Ambulatory Visit: Payer: Self-pay

## 2019-03-03 DIAGNOSIS — E538 Deficiency of other specified B group vitamins: Secondary | ICD-10-CM

## 2019-03-03 MED ORDER — CYANOCOBALAMIN 1000 MCG/ML IJ SOLN
1000.0000 ug | Freq: Once | INTRAMUSCULAR | Status: AC
  Administered 2019-03-03: 1000 ug via INTRAMUSCULAR

## 2019-03-03 NOTE — Progress Notes (Signed)
Per orders of Dr. Duncan, injection of B12 given by [place], Shapale M. Patient tolerated injection well. 

## 2019-03-27 ENCOUNTER — Other Ambulatory Visit: Payer: Self-pay | Admitting: Family Medicine

## 2019-03-28 ENCOUNTER — Encounter: Payer: Self-pay | Admitting: Family Medicine

## 2019-03-30 ENCOUNTER — Other Ambulatory Visit: Payer: Self-pay | Admitting: Endocrinology

## 2019-03-30 ENCOUNTER — Other Ambulatory Visit: Payer: 59

## 2019-04-02 ENCOUNTER — Telehealth: Payer: Self-pay | Admitting: Family Medicine

## 2019-04-02 NOTE — Telephone Encounter (Signed)
Pt scheduled for 04/09/19 for Lab and nurse visit beginning at 2:45pm.

## 2019-04-02 NOTE — Telephone Encounter (Signed)
Please call pt. He still needs B12 shot at [facility].  Please schedule.   If he wants to go ahead and get labs at the same time then that is okay with me.  Labs are already ordered.  Labs would need to be done prior to the B12 shot but could be done on the same day.   Thanks.

## 2019-04-06 ENCOUNTER — Encounter: Payer: 59 | Admitting: Family Medicine

## 2019-04-09 ENCOUNTER — Other Ambulatory Visit (INDEPENDENT_AMBULATORY_CARE_PROVIDER_SITE_OTHER): Payer: 59

## 2019-04-09 ENCOUNTER — Ambulatory Visit (INDEPENDENT_AMBULATORY_CARE_PROVIDER_SITE_OTHER): Payer: 59

## 2019-04-09 ENCOUNTER — Other Ambulatory Visit: Payer: Self-pay

## 2019-04-09 DIAGNOSIS — E538 Deficiency of other specified B group vitamins: Secondary | ICD-10-CM

## 2019-04-09 DIAGNOSIS — E119 Type 2 diabetes mellitus without complications: Secondary | ICD-10-CM | POA: Diagnosis not present

## 2019-04-09 DIAGNOSIS — Z125 Encounter for screening for malignant neoplasm of prostate: Secondary | ICD-10-CM | POA: Diagnosis not present

## 2019-04-09 MED ORDER — CYANOCOBALAMIN 1000 MCG/ML IJ SOLN
1000.0000 ug | Freq: Once | INTRAMUSCULAR | Status: AC
  Administered 2019-04-09: 1000 ug via INTRAMUSCULAR

## 2019-04-09 NOTE — Progress Notes (Signed)
Per orders of Dr. Duncan, injection of B12 given by Kareem Aul V Paysen Goza. Patient tolerated injection well.  

## 2019-04-10 LAB — COMPREHENSIVE METABOLIC PANEL
ALT: 17 U/L (ref 0–53)
AST: 26 U/L (ref 0–37)
Albumin: 4.3 g/dL (ref 3.5–5.2)
Alkaline Phosphatase: 77 U/L (ref 39–117)
BUN: 21 mg/dL (ref 6–23)
CO2: 28 mEq/L (ref 19–32)
Calcium: 9.3 mg/dL (ref 8.4–10.5)
Chloride: 102 mEq/L (ref 96–112)
Creatinine, Ser: 1.3 mg/dL (ref 0.40–1.50)
GFR: 55.48 mL/min — ABNORMAL LOW (ref >60.00)
Glucose, Bld: 100 mg/dL — ABNORMAL HIGH (ref 70–99)
Potassium: 4.4 mEq/L (ref 3.5–5.1)
Sodium: 139 mEq/L (ref 135–145)
Total Bilirubin: 0.7 mg/dL (ref 0.2–1.2)
Total Protein: 6.6 g/dL (ref 6.0–8.3)

## 2019-04-10 LAB — PSA, MEDICARE: PSA: 1.34 ng/ml (ref 0.10–4.00)

## 2019-04-10 LAB — FERRITIN: Ferritin: 26 ng/mL (ref 22.0–322.0)

## 2019-04-10 LAB — LIPID PANEL
Cholesterol: 163 mg/dL (ref 0–200)
HDL: 33.3 mg/dL — ABNORMAL LOW (ref >39.00)
LDL Cholesterol: 104 mg/dL — ABNORMAL HIGH (ref 0–99)
NonHDL: 130.07
Total CHOL/HDL Ratio: 5
Triglycerides: 128 mg/dL (ref 0.0–149.0)
VLDL: 25.6 mg/dL (ref 0.0–40.0)

## 2019-04-10 LAB — TSH: TSH: 0.75 u[IU]/mL (ref 0.35–4.50)

## 2019-04-10 LAB — VITAMIN B12: Vitamin B-12: 331 pg/mL (ref 211–911)

## 2019-04-23 ENCOUNTER — Other Ambulatory Visit: Payer: Self-pay | Admitting: Family Medicine

## 2019-05-06 ENCOUNTER — Encounter: Payer: Self-pay | Admitting: Family Medicine

## 2019-05-07 ENCOUNTER — Ambulatory Visit: Payer: 59

## 2019-05-08 ENCOUNTER — Encounter: Payer: Self-pay | Admitting: Family Medicine

## 2019-05-08 ENCOUNTER — Ambulatory Visit: Payer: 59 | Admitting: Family Medicine

## 2019-05-08 ENCOUNTER — Other Ambulatory Visit: Payer: Self-pay

## 2019-05-08 DIAGNOSIS — R202 Paresthesia of skin: Secondary | ICD-10-CM | POA: Diagnosis not present

## 2019-05-08 MED ORDER — OXYCODONE-ACETAMINOPHEN 5-325 MG PO TABS: 1.0000 | ORAL_TABLET | Freq: Four times a day (QID) | ORAL | 0 refills | Status: DC | PRN

## 2019-05-08 NOTE — Progress Notes (Signed)
DM2 per endo.    Pandemic considerations d/w pt. He has been working from home, going to the office about once a week.   He had tingling in the L 5th finger started about 4 weeks ago.  He changed his work station and that may contribute to [employer].    Occ moves up the L side of the hand.  No trauma.  No trigger.  He had been trying to exercise his hand some and that seemed to help some but he still noted some intermittent sx.  Some occ L 4th finger sx, but mainly on the L 5th.  When he has sx, he has tingling but not absence of sensation.  No persistent grip changes in either hand but he has some occ strength change in the R wrist.  That has been episodic for about 18 months, occ noted briefly every 2-3 months and that seems to be a separate issue.  He is R handed.  No R hand sx otherwise.  Some occ tingling in the feet prev but that resolved and isn't present now.    He needed refill on oxycodone in case of renal stones.  D/w pt. no adverse effect on medication previously.  Routine cautions discussed.  History of diabetes, B12 deficiency discussed with patient.  Both are being addressed.  Meds, vitals, and allergies reviewed.   ROS: Per HPI unless specifically indicated in ROS section   nad ncat Neck supple, normal range of motion, no lymphadenopathy rrr ctab CN 2-12 wnl B, S/S/DTR wnl B. Normal S/S with light tough, vibration and monofilament in the hands. Normal capillary refill.

## 2019-05-08 NOTE — Patient Instructions (Signed)
Likely benign nerve compression causing symptoms (from your desktop, etc).  I would change one thing at a time and see if gradually get better.  Take care.  Glad to see you.

## 2019-05-10 DIAGNOSIS — R202 Paresthesia of skin: Secondary | ICD-10-CM | POA: Insufficient documentation

## 2019-05-10 NOTE — Assessment & Plan Note (Signed)
Paresthesia differential diagnosis discussed with patient Likely benign nerve compression causing symptoms (from his desktop, etc).  I think his left hand symptoms are mechanical.  He will adjust his desktop height, etc.  I would change one thing at a time and see if gradually get better.  He has a benign exam otherwise.  If he has recurrent right hand changes or any other concerns then I want him to let me know.  He agrees.  Okay for outpatient follow-up.

## 2019-05-12 ENCOUNTER — Ambulatory Visit (INDEPENDENT_AMBULATORY_CARE_PROVIDER_SITE_OTHER): Payer: 59 | Admitting: *Deleted

## 2019-05-12 DIAGNOSIS — E538 Deficiency of other specified B group vitamins: Secondary | ICD-10-CM | POA: Diagnosis not present

## 2019-05-12 MED ORDER — CYANOCOBALAMIN 1000 MCG/ML IJ SOLN
1000.0000 ug | Freq: Once | INTRAMUSCULAR | Status: AC
  Administered 2019-05-12: 1000 ug via INTRAMUSCULAR

## 2019-05-12 NOTE — Progress Notes (Signed)
Per orders of Dr. Duncan, injection of Vitamin B12 given by Jerrel Tiberio Simpson. Patient tolerated injection well.  

## 2019-05-21 ENCOUNTER — Other Ambulatory Visit: Payer: Self-pay | Admitting: Endocrinology

## 2019-05-21 DIAGNOSIS — E119 Type 2 diabetes mellitus without complications: Secondary | ICD-10-CM

## 2019-05-22 ENCOUNTER — Telehealth: Payer: Self-pay | Admitting: Endocrinology

## 2019-05-22 ENCOUNTER — Other Ambulatory Visit: Payer: Self-pay

## 2019-05-22 DIAGNOSIS — E119 Type 2 diabetes mellitus without complications: Secondary | ICD-10-CM

## 2019-05-22 MED ORDER — BASAGLAR KWIKPEN 100 UNIT/ML ~~LOC~~ SOPN: 26.0000 [IU] | PEN_INJECTOR | Freq: Every day | SUBCUTANEOUS | 0 refills | Status: DC

## 2019-05-22 NOTE — Telephone Encounter (Signed)
Overdue for an appt. Scheduled for 06/16/19. 30 day supply sent.  Insulin Glargine (BASAGLAR KWIKPEN) 100 UNIT/ML SOPN 8 mL 0 05/22/2019    Sig - Route: Inject 0.26 mLs (26 Units total) into the skin at bedtime. - Subcutaneous   Sent to pharmacy as: Insulin Glargine (BASAGLAR KWIKPEN) 100 UNIT/ML Solution Pen-injector   E-Prescribing Status: Receipt confirmed by pharmacy (05/22/2019 10:59 AM EDT)

## 2019-05-22 NOTE — Telephone Encounter (Signed)
MEDICATION: Insulin Glargine (BASAGLAR KWIKPEN) 100 UNIT/ML SOPN with refills AND insulin lispro (HUMALOG KWIKPEN) 100 UNIT/ML KwikPen with refills  PHARMACY:   [id number] - [place],  - [address] [phone] (Phone) [phone] (Fax)    IS THIS A 90 DAY SUPPLY : No  IS PATIENT OUT OF MEDICATION: No  IF NOT; HOW MUCH IS LEFT: 2 pens  LAST APPOINTMENT DATE: @6 /10/2019  NEXT APPOINTMENT DATE:@Visit  date not found  DO WE HAVE YOUR PERMISSION TO LEAVE A DETAILED MESSAGE: Noel Journey PH# [phone]  OTHER COMMENTS:    **Let patient know to contact pharmacy at the end of the day to make sure medication is ready. **  ** Please notify patient to allow 48-72 hours to process**  **Encourage patient to contact the pharmacy for refills or they can request refills through [facility]**

## 2019-05-24 ENCOUNTER — Telehealth: Payer: Self-pay | Admitting: Family Medicine

## 2019-05-24 DIAGNOSIS — K862 Cyst of pancreas: Secondary | ICD-10-CM

## 2019-05-24 NOTE — Telephone Encounter (Signed)
Due for f/u pancreatic imaging.  Ordered.  Thanks.   Prev MR with: Interval growth of cystic appearing lesion in the inferior aspect of the pancreatic head. This continues to have nonaggressive imaging characteristics, however, the interval growth warrants further evaluation. At this time, repeat abdominal MRI with and without IV gadolinium with MRCP is recommended in 12 months.

## 2019-06-08 ENCOUNTER — Other Ambulatory Visit: Payer: Self-pay | Admitting: Endocrinology

## 2019-06-08 DIAGNOSIS — E119 Type 2 diabetes mellitus without complications: Secondary | ICD-10-CM

## 2019-06-11 ENCOUNTER — Other Ambulatory Visit: Payer: Self-pay

## 2019-06-16 ENCOUNTER — Ambulatory Visit (INDEPENDENT_AMBULATORY_CARE_PROVIDER_SITE_OTHER): Payer: 59

## 2019-06-16 ENCOUNTER — Encounter: Payer: Self-pay | Admitting: Endocrinology

## 2019-06-16 ENCOUNTER — Other Ambulatory Visit: Payer: Self-pay

## 2019-06-16 ENCOUNTER — Ambulatory Visit: Payer: 59 | Admitting: Endocrinology

## 2019-06-16 VITALS — BP 110/68 | HR 63 | Ht 66.5 in | Wt 174.8 lb

## 2019-06-16 DIAGNOSIS — E538 Deficiency of other specified B group vitamins: Secondary | ICD-10-CM | POA: Diagnosis not present

## 2019-06-16 DIAGNOSIS — E119 Type 2 diabetes mellitus without complications: Secondary | ICD-10-CM

## 2019-06-16 LAB — POCT GLYCOSYLATED HEMOGLOBIN (HGB A1C): Hemoglobin A1C: 6.3 % — AB (ref 4.0–5.6)

## 2019-06-16 MED ORDER — INSULIN LISPRO (1 UNIT DIAL) 100 UNIT/ML (KWIKPEN): PEN_INJECTOR | SUBCUTANEOUS | 11 refills | Status: DC

## 2019-06-16 MED ORDER — CYANOCOBALAMIN 1000 MCG/ML IJ SOLN
1000.0000 ug | Freq: Once | INTRAMUSCULAR | Status: AC
  Administered 2019-06-16: 09:00:00 1000 ug via INTRAMUSCULAR

## 2019-06-16 NOTE — Progress Notes (Signed)
Subjective:    Patient ID: Ryan Crane, male    DOB: 1956/07/04, 63 y.o.   MRN: [id number]  HPI Pt returns for f/u of diabetes mellitus:  DM type: 1 Dx'ed: 7253 Complications: none Therapy: insulin since 2019, and januvia.  DKA: never.  Severe hypoglycemia: never Pancreatitis: never Pancreatic imaging: no mention is made on 2018 abd [place].  Other: he takes multiple daily injections; he declines pump and continuous glucose monitor for now.   Interval history:  pt states he feels well in general.  He takes insulin as rx'ed.  He brings a record of his cbg's which I have reviewed today.  It varies from 61-350.  It is in general lowest after a small-than-expected meal.  Otherwise, There is no trend throughout the day.   Past Medical History:  Diagnosis Date  . Allergy   . Arthritis   . Cholelithiasis    symptomatic  . Diabetes mellitus ([place])   . Family history of malignant neoplasm of gastrointestinal tract   . Fatty liver   . GERD (gastroesophageal reflux disease)   . Graves disease    s/p ablation  . Hemochromatosis   . History of chicken pox   . History of hiatal hernia   . History of kidney stones   . Hyperlipidemia   . Hypertension   . Internal hemorrhoids   . Kidney stones    . Personal history of colonic polyps 03/31/2008   tubular adenoma  . Right fibular fracture 2013    Past Surgical History:  Procedure Laterality Date  . CHOLECYSTECTOMY N/A 05/29/2017   Procedure: LAPAROSCOPIC CHOLECYSTECTOMY;  Surgeon: Clovis Riley, MD;  Location: [place];  Service: General;  Laterality: N/A;  . COLONOSCOPY W/ BIOPSIES AND POLYPECTOMY    . LITHOTRIPSY    . URETERAL STENT PLACEMENT     left    Social History   Socioeconomic History  . Marital status: Married    Spouse name: Not on file  . Number of children: 1  . Years of education: Not on file  . Highest education level: Not on file  Occupational History  . Occupation: Hydrologist  .  Occupation: PROJECT Health visitor: [place]  . Financial resource strain: Not on file  . Food insecurity    Worry: Not on file    Inability: Not on file  . Transportation needs    Medical: Not on file    Non-medical: Not on file  Tobacco Use  . Smoking status: Never Smoker  . Smokeless tobacco: Never Used  Substance and Sexual Activity  . Alcohol use: No    Alcohol/week: 0.0 standard drinks  . Drug use: No  . Sexual activity: Yes  Lifestyle  . Physical activity    Days per week: Not on file    Minutes per session: Not on file  . Stress: Not on file  Relationships  . Social Herbalist on phone: Not on file    Gets together: Not on file    Attends religious service: Not on file    Active member of club or organization: Not on file    Attends meetings of clubs or organizations: Not on file    Relationship status: Not on file  . Intimate partner violence    Fear of current or ex partner: Not on file    Emotionally abused: Not on file    Physically  abused: Not on file    Forced sexual activity: Not on file  Other Topics Concern  . Not on file  Social History Narrative   2 years of college   Manager at [employer]   Married 1977   1 daughter    Current Outpatient Medications on File Prior to Visit  Medication Sig Dispense Refill  . albuterol (VENTOLIN HFA) 108 (90 Base) MCG/ACT inhaler INHALE 1-2 PUFFS INTO THE LUNGS EVERY 6 (SIX) HOURS AS NEEDED FOR WHEEZING OR SHORTNESS OF BREATH. 1 Inhaler 3  . aspirin 81 MG tablet Take 81 mg by mouth daily.      . BD PEN NEEDLE NANO U/F 32G X 4 MM MISC USE PEN NEEDLES AS DIRECTED ONCE DAILY 100 each 3  . Blood Glucose Monitoring Suppl (ONETOUCH VERIO) w/Device KIT 1 kit by Does not apply route daily. Use to test blood sugar once daily Dx E11.9 1 kit 0  . cyanocobalamin (,VITAMIN B-12,) 1000 MCG/ML injection Inject 1,000 mcg into the muscle every 30 (thirty) days.    . diclofenac sodium (VOLTAREN) 1 % GEL  Apply 2 g topically 4 (four) times daily as needed. 100 g 12  . fenofibrate 160 MG tablet TAKE 1 TABLET BY MOUTH EVERY DAY 30 tablet 11  . fluticasone (FLONASE) 50 MCG/ACT nasal spray Place 1 spray into both nostrils daily.    . Insulin Glargine (BASAGLAR KWIKPEN) 100 UNIT/ML SOPN INJECT 0.26 MLS (26 UNITS TOTAL) INTO THE SKIN AT BEDTIME. 6 pen 0  . Lancets (ONETOUCH DELICA PLUS [id number]) MISC USE TO TEST BLOOD SUGARS 7 TIMES DAILY 200 each 6  . omeprazole (PRILOSEC) 40 MG capsule TAKE 1 CAPSULE (40 MG TOTAL) BY MOUTH DAILY AS NEEDED 30 capsule 2  . ONETOUCH VERIO test strip USE TO TEST BLOOD SUGARS AS DIRECTED 7 TIMES A DAY. 100 each 11  . oxyCODONE-acetaminophen (PERCOCET/ROXICET) 5-325 MG tablet Take 1-2 tablets by mouth every 6 (six) hours as needed (for kidney stones). 10 tablet 0  . SYNTHROID 150 MCG tablet TAKE 1 TABLET (150 MCG TOTAL) BY MOUTH DAILY BEFORE BREAKFAST. 30 tablet 11   No current facility-administered medications on file prior to visit.     Allergies  Allergen Reactions  . Flomax [Tamsulosin Hcl]     nausea    Family History  Problem Relation Age of Onset  . COPD Mother   . Arthritis Mother   . Diabetes Mother   . Hypertension Mother   . Lung cancer Father   . Colon cancer Maternal Grandmother 28  . Prostate cancer Neg Hx   . Esophageal cancer Neg Hx   . Rectal cancer Neg Hx   . Stomach cancer Neg Hx   . Thyroid disease Neg Hx     BP 110/68 (BP Location: Left Arm, Patient Position: Sitting, Cuff Size: Normal)   Pulse 63   Ht 5' 6.5" (1.689 m)   Wt 174 lb 12.8 oz (79.3 kg)   SpO2 97%   BMI 27.79 kg/m    Review of Systems Denies LOC    Objective:   Physical Exam VITAL SIGNS:  See vs page GENERAL: no distress Pulses: dorsalis pedis intact bilat.   MSK: no deformity of the feet CV: trace bilat leg edema.   Skin:  no ulcer on the feet.  normal color and temp on the feet. Neuro: sensation is intact to touch on the feet    Lab Results   Component Value Date   HGBA1C 6.3 (A) 06/16/2019  Assessment & Plan:  Type 1 DM: overcontrolled Hypoglycemia: this limits aggressiveness of glycemic control.    Patient Instructions  Please continue the same basaglar, and: Reduce the humalog to 12-14 units with breakfast and with lunch, and 14-16 units with supper. check your blood sugar 4 times a day: before the 3 meals, and at bedtime.  also check if you have symptoms of your blood sugar being too high or too low.  please keep a record of the readings and bring it to your next appointment here (or you can bring the meter itself).  You can write it on any piece of paper.  please call us sooner if your blood sugar goes below 70, or if you have a lot of readings over 200.   Please come back for a follow-up appointment in 4 months.

## 2019-06-16 NOTE — Progress Notes (Signed)
Per orders of Dr. Danise Mina, injection of Vitamin B12 given by Diamond Nickel, RN.  Administered to R deltoid IM.   Patient tolerated injection well.

## 2019-06-16 NOTE — Patient Instructions (Addendum)
Please continue the same basaglar, and: Reduce the humalog to 12-14 units with breakfast and with lunch, and 14-16 units with supper. check your blood sugar 4 times a day: before the 3 meals, and at bedtime.  also check if you have symptoms of your blood sugar being too high or too low.  please keep a record of the readings and bring it to your next appointment here (or you can bring the meter itself).  You can write it on any piece of paper.  please call us sooner if your blood sugar goes below 70, or if you have a lot of readings over 200.   Please come back for a follow-up appointment in 4 months.

## 2019-06-17 ENCOUNTER — Ambulatory Visit
Admission: RE | Admit: 2019-06-17 | Discharge: 2019-06-17 | Disposition: A | Discharge status: Home / Self Care | Payer: 59 | Source: Ambulatory Visit | Attending: Family Medicine | Admitting: Family Medicine

## 2019-06-17 ENCOUNTER — Other Ambulatory Visit: Payer: Self-pay

## 2019-06-17 DIAGNOSIS — K862 Cyst of pancreas: Secondary | ICD-10-CM

## 2019-06-17 MED ORDER — GADOBENATE DIMEGLUMINE 529 MG/ML IV SOLN
15.0000 mL | Freq: Once | INTRAVENOUS | Status: AC | PRN
  Administered 2019-06-17: 15 mL via INTRAVENOUS

## 2019-06-18 ENCOUNTER — Other Ambulatory Visit: Payer: Self-pay | Admitting: Family Medicine

## 2019-06-21 ENCOUNTER — Telehealth: Payer: Self-pay | Admitting: Family Medicine

## 2019-06-21 NOTE — Telephone Encounter (Signed)
Error

## 2019-06-26 ENCOUNTER — Other Ambulatory Visit: Payer: 59

## 2019-07-03 ENCOUNTER — Ambulatory Visit (INDEPENDENT_AMBULATORY_CARE_PROVIDER_SITE_OTHER): Payer: 59 | Admitting: Family Medicine

## 2019-07-03 ENCOUNTER — Encounter: Payer: Self-pay | Admitting: Family Medicine

## 2019-07-03 ENCOUNTER — Other Ambulatory Visit: Payer: Self-pay

## 2019-07-03 VITALS — BP 126/68 | HR 70 | Temp 98.3°F | Ht 66.5 in | Wt 174.2 lb

## 2019-07-03 DIAGNOSIS — E0500 Thyrotoxicosis with diffuse goiter without thyrotoxic crisis or storm: Secondary | ICD-10-CM

## 2019-07-03 DIAGNOSIS — Z0000 Encounter for general adult medical examination without abnormal findings: Secondary | ICD-10-CM | POA: Diagnosis not present

## 2019-07-03 DIAGNOSIS — Z7189 Other specified counseling: Secondary | ICD-10-CM

## 2019-07-03 DIAGNOSIS — E119 Type 2 diabetes mellitus without complications: Secondary | ICD-10-CM

## 2019-07-03 DIAGNOSIS — E538 Deficiency of other specified B group vitamins: Secondary | ICD-10-CM

## 2019-07-03 MED ORDER — OMEPRAZOLE 40 MG PO CPDR: DELAYED_RELEASE_CAPSULE | ORAL | 3 refills | Status: DC

## 2019-07-03 NOTE — Progress Notes (Signed)
CPE- See plan.  Routine anticipatory guidance given to patient.  See health maintenance.  The possibility exists that previously documented standard health maintenance information may have been brought forward from a previous encounter into this note.  If needed, that same information has been updated to reflect the current situation based on today's encounter.    Tetanus 2019  Flu done yearly  Shingles shot prev done.  PNA shot 2017 Colonoscopy 2013  PSA prev wnl. D/w pt about options. Would be okay to check with physicals.  Living will d/w pt. Would have his wife designated if pt were incapacitated.   DM2 per endo.  A1c controlled, d/w pt.  Hypoglycemia cautions d/w pt.    Hemochromatosis d/w pt.  Prev ferritin reasonable.  dw pt about diet.    Hypothyroidism.  TSH wnl.  No neck mass.  No dysphagia.    B12 def. compliant.  Prev labs d/w pt.  Prev trough level wnl.    No new abd pain.  He has occ discomfort leaning over to tie his shoes at baseline.  No FCNAVD.  No jaundice.  No blood in stool.    Stable 1.9 cm unilocular cystic lesion in the pancreatic uncinate process, favoring a pseudocyst or benign side branch IPMN. Continued annual follow-up CT or MRI abdomen with/without contrast is suggested.    Discussed with patient about previous imaging and setting follow-up for next year.  I put a reminder in the EMR.  PMH and SH reviewed  Meds, vitals, and allergies reviewed.   ROS: Per HPI.  Unless specifically indicated otherwise in HPI, the patient denies:  General: fever. Eyes: acute vision changes ENT: sore throat Cardiovascular: chest pain Respiratory: SOB GI: vomiting GU: dysuria Musculoskeletal: acute back pain Derm: acute rash Neuro: acute motor dysfunction Psych: worsening mood Endocrine: polydipsia Heme: bleeding Allergy: hayfever  GEN: nad, alert and oriented HEENT: ncat NECK: supple w/o LA CV: rrr. PULM: ctab, no inc wob ABD: soft, +bs EXT: no  edema SKIN: no acute rash  Diabetic foot exam: Normal inspection No skin breakdown Callus on R 1st toe Normal DP pulses Normal sensation to light touch and monofilament Nails normal

## 2019-07-03 NOTE — Patient Instructions (Addendum)
Check with your insurance to see if they will cover the shingrix shot. As the front about moving your next B12 to 07/17/2019.   Recheck pancreas MRI next summer.   Schedule physical next year when possible.   Take care.  Glad to see you.  Thank you for your effort.

## 2019-07-06 NOTE — Assessment & Plan Note (Signed)
Tetanus 2019  Flu done yearly  Shingles shot prev done.  PNA shot 2017 Colonoscopy 2013  PSA prev wnl. D/w pt about options. Would be okay to check with physicals.  Living will d/w pt. Would have his wife designated if pt were incapacitated.

## 2019-07-06 NOTE — Assessment & Plan Note (Signed)
TSH wnl.  No neck mass.  No dysphagia.   Continue as is.

## 2019-07-06 NOTE — Assessment & Plan Note (Signed)
Hemochromatosis d/w pt.  Prev ferritin reasonable.  dw pt about diet.

## 2019-07-06 NOTE — Assessment & Plan Note (Signed)
B12 def. compliant.  Prev labs d/w pt.  Prev trough level wnl.

## 2019-07-06 NOTE — Assessment & Plan Note (Signed)
Living will d/w pt. Would have his wife designated if pt were incapacitated.

## 2019-07-06 NOTE — Assessment & Plan Note (Signed)
A1c controlled, d/w pt.  Hypoglycemia cautions d/w pt.

## 2019-07-10 ENCOUNTER — Encounter: Payer: Self-pay | Admitting: Endocrinology

## 2019-07-10 ENCOUNTER — Ambulatory Visit: Payer: 59 | Admitting: Endocrinology

## 2019-07-10 ENCOUNTER — Other Ambulatory Visit: Payer: Self-pay

## 2019-07-10 VITALS — BP 122/80 | HR 65 | Ht 66.5 in | Wt 176.0 lb

## 2019-07-10 DIAGNOSIS — E10649 Type 1 diabetes mellitus with hypoglycemia without coma: Secondary | ICD-10-CM | POA: Diagnosis not present

## 2019-07-10 MED ORDER — INSULIN LISPRO (1 UNIT DIAL) 100 UNIT/ML (KWIKPEN): PEN_INJECTOR | SUBCUTANEOUS | 11 refills | Status: DC

## 2019-07-10 NOTE — Progress Notes (Signed)
Subjective:    Patient ID: Ryan Crane, male    DOB: [birth year], 63 y.o.   MRN: [id number]  HPI Pt returns for f/u of diabetes mellitus:  DM type: 1 Dx'ed: 2841 Complications: none Therapy: insulin since 2019, and januvia.  DKA: never.  Severe hypoglycemia: never Pancreatitis: never Pancreatic imaging: no mention is made on 2018 abd [place].  Other: he takes multiple daily injections; he declines pump and continuous glucose monitor for now.   Interval history:  He takes insulin as rx'ed.  He brings a record of his cbg's which I have reviewed today.  It varies from 56-316.  It is in general lower PC than AC. Pt had an episode of mild hypoglycemia at HS last 2 days ago, approx 5 hrs after supper.  He says he had decreased consciousness.  He is unable to say why this happened.  Past Medical History:  Diagnosis Date  . Allergy   . Arthritis   . Cholelithiasis    symptomatic  . Diabetes mellitus ([place])   . Family history of malignant neoplasm of gastrointestinal tract   . Fatty liver   . GERD (gastroesophageal reflux disease)   . Graves disease    s/p ablation  . Hemochromatosis   . History of chicken pox   . History of hiatal hernia   . History of kidney stones   . Hyperlipidemia   . Hypertension   . Internal hemorrhoids   . Kidney stones    . Personal history of colonic polyps 03/31/2008   tubular adenoma  . Right fibular fracture 2013    Past Surgical History:  Procedure Laterality Date  . CHOLECYSTECTOMY N/A 05/29/2017   Procedure: LAPAROSCOPIC CHOLECYSTECTOMY;  Surgeon: Clovis Riley, MD;  Location: [place];  Service: General;  Laterality: N/A;  . COLONOSCOPY W/ BIOPSIES AND POLYPECTOMY    . LITHOTRIPSY    . URETERAL STENT PLACEMENT     left    Social History   Socioeconomic History  . Marital status: Married    Spouse name: Not on file  . Number of children: 1  . Years of education: Not on file  . Highest education level: Not on file  Occupational History   . Occupation: Herbalist: [employer]  . Financial resource strain: Not on file  . Food insecurity    Worry: Not on file    Inability: Not on file  . Transportation needs    Medical: Not on file    Non-medical: Not on file  Tobacco Use  . Smoking status: Never Smoker  . Smokeless tobacco: Never Used  Substance and Sexual Activity  . Alcohol use: No    Alcohol/week: 0.0 standard drinks  . Drug use: No  . Sexual activity: Yes  Lifestyle  . Physical activity    Days per week: Not on file    Minutes per session: Not on file  . Stress: Not on file  Relationships  . Social Herbalist on phone: Not on file    Gets together: Not on file    Attends religious service: Not on file    Active member of club or organization: Not on file    Attends meetings of clubs or organizations: Not on file    Relationship status: Not on file  . Intimate partner violence    Fear of current or ex partner: Not on file    Emotionally abused: Not on file  Physically abused: Not on file    Forced sexual activity: Not on file  Other Topics Concern  . Not on file  Social History Narrative   2 years of college   Manager at [employer]   Married 1977   1 daughter    Current Outpatient Medications on File Prior to Visit  Medication Sig Dispense Refill  . albuterol (VENTOLIN HFA) 108 (90 Base) MCG/ACT inhaler INHALE 1-2 PUFFS INTO THE LUNGS EVERY 6 (SIX) HOURS AS NEEDED FOR WHEEZING OR SHORTNESS OF BREATH. 1 Inhaler 3  . aspirin 81 MG tablet Take 81 mg by mouth daily.      . BD PEN NEEDLE NANO U/F 32G X 4 MM MISC USE PEN NEEDLES AS DIRECTED ONCE DAILY 100 each 3  . Blood Glucose Monitoring Suppl (ONETOUCH VERIO) w/Device KIT 1 kit by Does not apply route daily. Use to test blood sugar once daily Dx E11.9 1 kit 0  . cyanocobalamin (,VITAMIN B-12,) 1000 MCG/ML injection Inject 1,000 mcg into the muscle every 30 (thirty) days.    . diclofenac sodium (VOLTAREN) 1 % GEL  Apply 2 g topically 4 (four) times daily as needed. 100 g 12  . fenofibrate 160 MG tablet TAKE 1 TABLET BY MOUTH EVERY DAY 30 tablet 11  . fluticasone (FLONASE) 50 MCG/ACT nasal spray Place 1 spray into both nostrils daily.    . Insulin Glargine (BASAGLAR KWIKPEN) 100 UNIT/ML SOPN INJECT 0.26 MLS (26 UNITS TOTAL) INTO THE SKIN AT BEDTIME. 6 pen 0  . Lancets (ONETOUCH DELICA PLUS [id number]) MISC USE TO TEST BLOOD SUGARS 7 TIMES DAILY 200 each 6  . lisinopril (ZESTRIL) 20 MG tablet TAKE 1 TABLET BY MOUTH EVERY DAY 30 tablet 0  . omeprazole (PRILOSEC) 40 MG capsule 1 tab a day as needed. 30 capsule 3  . ONETOUCH VERIO test strip USE TO TEST BLOOD SUGARS AS DIRECTED 7 TIMES A DAY. 100 each 11  . oxyCODONE-acetaminophen (PERCOCET/ROXICET) 5-325 MG tablet Take 1-2 tablets by mouth every 6 (six) hours as needed (for kidney stones). 10 tablet 0  . SYNTHROID 150 MCG tablet TAKE 1 TABLET (150 MCG TOTAL) BY MOUTH DAILY BEFORE BREAKFAST. 30 tablet 11   No current facility-administered medications on file prior to visit.     Allergies  Allergen Reactions  . Flomax [Tamsulosin Hcl]     nausea    Family History  Problem Relation Age of Onset  . COPD Mother   . Arthritis Mother   . Diabetes Mother   . Hypertension Mother   . Lung cancer Father   . Colon cancer Maternal Grandmother 43  . Prostate cancer Neg Hx   . Esophageal cancer Neg Hx   . Rectal cancer Neg Hx   . Stomach cancer Neg Hx   . Thyroid disease Neg Hx     BP 122/80 (BP Location: Left Arm, Patient Position: Sitting, Cuff Size: Normal)   Pulse 65   Ht 5' 6.5" (1.689 m)   Wt 176 lb (79.8 kg)   SpO2 98%   BMI 27.98 kg/m   Review of Systems Denies weight change.      Objective:   Physical Exam VITAL SIGNS:  See vs page GENERAL: no distress Pulses: dorsalis pedis intact bilat.   MSK: no deformity of the feet CV: no leg edema Skin:  no ulcer on the feet.  normal color and temp on the feet. Neuro: sensation is intact to  touch on the feet    Lab Results  Component Value Date   HGBA1C 6.3 (A) 06/16/2019       Assessment & Plan:  Type 1 DM: overcontrolled Hypoglycemia, worse: this limits aggressiveness of glycemic control  Patient Instructions  Please continue the same basaglar, and: Reduce the humalog to 11-13 units with breakfast and with lunch, and 13-14 units with supper. check your blood sugar 4 times a day: before the 3 meals, and at bedtime.  also check if you have symptoms of your blood sugar being too high or too low.  please keep a record of the readings and bring it to your next appointment here (or you can bring the meter itself).  You can write it on any piece of paper.  please call us sooner if your blood sugar goes below 70, or if you have a lot of readings over 200.   Please see Vaughan Basta, to consider a continuous glucose monitor.  Please come back for a follow-up appointment in 2 months.

## 2019-07-10 NOTE — Patient Instructions (Addendum)
Please continue the same basaglar, and: Reduce the humalog to 11-13 units with breakfast and with lunch, and 13-14 units with supper. check your blood sugar 4 times a day: before the 3 meals, and at bedtime.  also check if you have symptoms of your blood sugar being too high or too low.  please keep a record of the readings and bring it to your next appointment here (or you can bring the meter itself).  You can write it on any piece of paper.  please call us sooner if your blood sugar goes below 70, or if you have a lot of readings over 200.   Please see Ryan Crane, to consider a continuous glucose monitor.  Please come back for a follow-up appointment in 2 months.

## 2019-07-14 ENCOUNTER — Other Ambulatory Visit: Payer: Self-pay

## 2019-07-14 ENCOUNTER — Encounter: Payer: 59 | Attending: Endocrinology | Admitting: Nutrition

## 2019-07-14 DIAGNOSIS — E10649 Type 1 diabetes mellitus with hypoglycemia without coma: Secondary | ICD-10-CM | POA: Diagnosis present

## 2019-07-15 ENCOUNTER — Other Ambulatory Visit: Payer: Self-pay | Admitting: Family Medicine

## 2019-07-17 ENCOUNTER — Ambulatory Visit (INDEPENDENT_AMBULATORY_CARE_PROVIDER_SITE_OTHER): Payer: 59

## 2019-07-17 DIAGNOSIS — E538 Deficiency of other specified B group vitamins: Secondary | ICD-10-CM

## 2019-07-17 MED ORDER — CYANOCOBALAMIN 1000 MCG/ML IJ SOLN
1000.0000 ug | Freq: Once | INTRAMUSCULAR | Status: AC
  Administered 2019-07-17: 1000 ug via INTRAMUSCULAR

## 2019-07-17 NOTE — Progress Notes (Signed)
Per orders of Dr. Earl Lagos by Dr. Danise Mina injection of B12 given by Kris Mouton. Patient tolerated injection well.

## 2019-07-19 NOTE — Progress Notes (Signed)
Patient had a severe low blood sugar that scared him.  He is now considering a CGM.  We discussed the difference between blood sugar readings and sensor readings, and he reported good understanding of this.  He was shown the 3 CGMs, and was given brochures on each one.  We discussed the advantages and disadvantages of each model.  He was told to go on line to view more details of each model, and let us know which one he chooses.  He agreed to do this and had no final questions.

## 2019-07-19 NOTE — Patient Instructions (Signed)
Go on line to view more details of each CGM. Call us to let us know which model you want.

## 2019-07-21 ENCOUNTER — Ambulatory Visit: Payer: 59

## 2019-08-10 ENCOUNTER — Other Ambulatory Visit: Payer: Self-pay | Admitting: Endocrinology

## 2019-08-12 ENCOUNTER — Other Ambulatory Visit: Payer: Self-pay | Admitting: Endocrinology

## 2019-08-12 ENCOUNTER — Other Ambulatory Visit: Payer: Self-pay | Admitting: Family Medicine

## 2019-08-12 DIAGNOSIS — E119 Type 2 diabetes mellitus without complications: Secondary | ICD-10-CM

## 2019-08-17 ENCOUNTER — Other Ambulatory Visit: Payer: Self-pay | Admitting: Endocrinology

## 2019-08-17 DIAGNOSIS — E119 Type 2 diabetes mellitus without complications: Secondary | ICD-10-CM

## 2019-08-25 ENCOUNTER — Ambulatory Visit (INDEPENDENT_AMBULATORY_CARE_PROVIDER_SITE_OTHER): Payer: 59

## 2019-08-25 DIAGNOSIS — E538 Deficiency of other specified B group vitamins: Secondary | ICD-10-CM | POA: Diagnosis not present

## 2019-08-25 MED ORDER — CYANOCOBALAMIN 1000 MCG/ML IJ SOLN
1000.0000 ug | Freq: Once | INTRAMUSCULAR | Status: AC
  Administered 2019-08-25: 1000 ug via INTRAMUSCULAR

## 2019-08-25 NOTE — Progress Notes (Signed)
Per orders of Dr. Damita Dunnings , injection of Vitamin B12 given by Diamond Nickel, RN.  Administered to Left deltoid per patient's insistence due to a sore right arm/shoulder.    Patient tolerated injection well.

## 2019-09-04 ENCOUNTER — Ambulatory Visit: Payer: 59 | Admitting: Family Medicine

## 2019-09-04 ENCOUNTER — Other Ambulatory Visit: Payer: Self-pay

## 2019-09-04 ENCOUNTER — Ambulatory Visit (INDEPENDENT_AMBULATORY_CARE_PROVIDER_SITE_OTHER)
Admission: RE | Admit: 2019-09-04 | Discharge: 2019-09-04 | Disposition: A | Discharge status: Home / Self Care | Payer: 59 | Source: Ambulatory Visit | Attending: Family Medicine | Admitting: Family Medicine

## 2019-09-04 ENCOUNTER — Encounter: Payer: Self-pay | Admitting: Family Medicine

## 2019-09-04 VITALS — BP 104/60 | HR 70 | Temp 97.2°F | Ht 66.5 in | Wt 176.6 lb

## 2019-09-04 DIAGNOSIS — M25511 Pain in right shoulder: Secondary | ICD-10-CM

## 2019-09-04 NOTE — Progress Notes (Signed)
Shoulder pain. About 2 months ago, after CPE here, he was playing basketball and felt a little soreness at the time.  Then a few days later it was more sore.  He waited for it to get better, took some ibuprofen prior to bedtime. Then 3 days ago he was at a job site going up a ladder and felt more pain.  He had to take an oxycodone once that he had on hand for kidney stones, that is atypical for patient.  No audible pop or snap.  No bruising.  No pain in L shoulder.    No FCNAVD.  Pain sleeping on R shoulder.  Can raise his arm and "feel it a little."   Flu shot to be done at work.  Meds, vitals, and allergies reviewed.   ROS: Per HPI unless specifically indicated in ROS section   nad ncat Neck supple, normal ROM Right shoulder without arm drop but he has pain on full elevation.  Pain with internal and external rotation.  Positive impingement. AC joint nontender.  No pain on AC cross loading. Normal elbow range of motion.  Normal grip. Distally neurovascular intact.

## 2019-09-04 NOTE — Patient Instructions (Signed)
Go to the lab on the way out.  We'll contact you with your xray report. Use the shoulder exercises and take ibuprofen if needed.  If severe pain, then use oxycodone with sedation caution and update me as needed.  Take care.  Glad to see you.

## 2019-09-06 ENCOUNTER — Other Ambulatory Visit: Payer: Self-pay | Admitting: Family Medicine

## 2019-09-06 DIAGNOSIS — R9389 Abnormal findings on diagnostic imaging of other specified body structures: Secondary | ICD-10-CM

## 2019-09-06 DIAGNOSIS — M25511 Pain in right shoulder: Secondary | ICD-10-CM | POA: Insufficient documentation

## 2019-09-06 NOTE — Assessment & Plan Note (Signed)
Likely rotator cuff irritation.  Check plain films today.  Anatomy discussed with patient.  Home exercise program discussed with patient, handout given and demonstrated.  He will use the shoulder exercises and take ibuprofen if needed.  If severe pain, then use oxycodone with sedation caution and update me as needed.  He agrees with plan.

## 2019-09-08 ENCOUNTER — Other Ambulatory Visit: Payer: Self-pay

## 2019-09-08 ENCOUNTER — Ambulatory Visit (INDEPENDENT_AMBULATORY_CARE_PROVIDER_SITE_OTHER)
Admission: RE | Admit: 2019-09-08 | Discharge: 2019-09-08 | Disposition: A | Discharge status: Home / Self Care | Payer: 59 | Source: Ambulatory Visit | Attending: Family Medicine | Admitting: Family Medicine

## 2019-09-08 DIAGNOSIS — R9389 Abnormal findings on diagnostic imaging of other specified body structures: Secondary | ICD-10-CM | POA: Diagnosis not present

## 2019-09-10 ENCOUNTER — Encounter: Payer: Self-pay | Admitting: Endocrinology

## 2019-09-10 ENCOUNTER — Ambulatory Visit: Payer: 59 | Admitting: Endocrinology

## 2019-09-10 ENCOUNTER — Other Ambulatory Visit: Payer: Self-pay

## 2019-09-10 VITALS — BP 138/72 | HR 74 | Ht 66.5 in | Wt 174.2 lb

## 2019-09-10 DIAGNOSIS — E10649 Type 1 diabetes mellitus with hypoglycemia without coma: Secondary | ICD-10-CM | POA: Diagnosis not present

## 2019-09-10 LAB — POCT GLYCOSYLATED HEMOGLOBIN (HGB A1C): Hemoglobin A1C: 6 % — AB (ref 4.0–5.6)

## 2019-09-10 MED ORDER — INSULIN LISPRO (1 UNIT DIAL) 100 UNIT/ML (KWIKPEN): PEN_INJECTOR | SUBCUTANEOUS | 11 refills | Status: DC

## 2019-09-10 NOTE — Progress Notes (Signed)
Subjective:    Patient ID: Ryan Crane, male    DOB: 1956/03/25, 63 y.o.   MRN: [id number]  HPI Pt returns for f/u of diabetes mellitus:  DM type: 1 Dx'ed: 4709 Complications: none Therapy: insulin since 2019, and januvia.  DKA: never.  Severe hypoglycemia: 1 possible episode 2020 Pancreatitis: never Pancreatic imaging: no mention is made on 2018 abd [place].  Other: he takes multiple daily injections; he declines pump and continuous glucose monitor for now.   Interval history:  He takes insulin as rx'ed.  He brings a record of his cbg's which I have reviewed today.  It varies from 62-278.   Past Medical History:  Diagnosis Date  . Allergy   . Arthritis   . Cholelithiasis    symptomatic  . Diabetes mellitus ([place])   . Family history of malignant neoplasm of gastrointestinal tract   . Fatty liver   . GERD (gastroesophageal reflux disease)   . Graves disease    s/p ablation  . Hemochromatosis   . History of chicken pox   . History of hiatal hernia   . History of kidney stones   . Hyperlipidemia   . Hypertension   . Internal hemorrhoids   . Kidney stones    . Personal history of colonic polyps 03/31/2008   tubular adenoma  . Right fibular fracture 2013    Past Surgical History:  Procedure Laterality Date  . CHOLECYSTECTOMY N/A 05/29/2017   Procedure: LAPAROSCOPIC CHOLECYSTECTOMY;  Surgeon: Clovis Riley, MD;  Location: [place];  Service: General;  Laterality: N/A;  . COLONOSCOPY W/ BIOPSIES AND POLYPECTOMY    . LITHOTRIPSY    . URETERAL STENT PLACEMENT     left    Social History   Socioeconomic History  . Marital status: Married    Spouse name: Not on file  . Number of children: 1  . Years of education: Not on file  . Highest education level: Not on file  Occupational History  . Occupation: Herbalist: [employer]  . Financial resource strain: Not on file  . Food insecurity    Worry: Not on file    Inability: Not on file  .  Transportation needs    Medical: Not on file    Non-medical: Not on file  Tobacco Use  . Smoking status: Never Smoker  . Smokeless tobacco: Never Used  Substance and Sexual Activity  . Alcohol use: No    Alcohol/week: 0.0 standard drinks  . Drug use: No  . Sexual activity: Yes  Lifestyle  . Physical activity    Days per week: Not on file    Minutes per session: Not on file  . Stress: Not on file  Relationships  . Social Herbalist on phone: Not on file    Gets together: Not on file    Attends religious service: Not on file    Active member of club or organization: Not on file    Attends meetings of clubs or organizations: Not on file    Relationship status: Not on file  . Intimate partner violence    Fear of current or ex partner: Not on file    Emotionally abused: Not on file    Physically abused: Not on file    Forced sexual activity: Not on file  Other Topics Concern  . Not on file  Social History Narrative   2 years of college   Manager  at [employer]   Married 1977   1 daughter    Current Outpatient Medications on File Prior to Visit  Medication Sig Dispense Refill  . albuterol (VENTOLIN HFA) 108 (90 Base) MCG/ACT inhaler INHALE 1-2 PUFFS INTO THE LUNGS EVERY 6 (SIX) HOURS AS NEEDED FOR WHEEZING OR SHORTNESS OF BREATH. 1 Inhaler 3  . aspirin 81 MG tablet Take 81 mg by mouth daily.      . BD PEN NEEDLE NANO U/F 32G X 4 MM MISC USE PEN NEEDLES AS DIRECTED ONCE DAILY 100 each 3  . Blood Glucose Monitoring Suppl (ONETOUCH VERIO) w/Device KIT 1 kit by Does not apply route daily. Use to test blood sugar once daily Dx E11.9 1 kit 0  . cyanocobalamin (,VITAMIN B-12,) 1000 MCG/ML injection Inject 1,000 mcg into the muscle every 30 (thirty) days.    . diclofenac sodium (VOLTAREN) 1 % GEL Apply 2 g topically 4 (four) times daily as needed. 100 g 12  . fenofibrate 160 MG tablet TAKE 1 TABLET BY MOUTH EVERY DAY 30 tablet 11  . fluticasone (FLONASE) 50 MCG/ACT nasal spray  Place 1 spray into both nostrils daily.    . Insulin Glargine (BASAGLAR KWIKPEN) 100 UNIT/ML SOPN INJECT 0.05 MLS (5 UNITS TOTAL) INTO THE SKIN AT BEDTIME. 5 pen 3  . Lancets (ONETOUCH DELICA PLUS [id number]) MISC USE TO TEST BLOOD SUGARS 7 TIMES DAILY 200 each 6  . lisinopril (ZESTRIL) 20 MG tablet TAKE 1 TABLET BY MOUTH EVERY DAY 90 tablet 2  . omeprazole (PRILOSEC) 40 MG capsule 1 tab a day as needed. 30 capsule 3  . ONETOUCH VERIO test strip USE TO TEST BLOOD SUGARS AS DIRECTED 7 TIMES A DAY. 100 each 11  . oxyCODONE-acetaminophen (PERCOCET/ROXICET) 5-325 MG tablet Take 1-2 tablets by mouth every 6 (six) hours as needed (for kidney stones). 10 tablet 0  . SYNTHROID 150 MCG tablet TAKE 1 TABLET (150 MCG TOTAL) BY MOUTH DAILY BEFORE BREAKFAST. 30 tablet 11   No current facility-administered medications on file prior to visit.     Allergies  Allergen Reactions  . Flomax [Tamsulosin Hcl]     nausea    Family History  Problem Relation Age of Onset  . COPD Mother   . Arthritis Mother   . Diabetes Mother   . Hypertension Mother   . Lung cancer Father   . Colon cancer Maternal Grandmother 51  . Prostate cancer Neg Hx   . Esophageal cancer Neg Hx   . Rectal cancer Neg Hx   . Stomach cancer Neg Hx   . Thyroid disease Neg Hx     BP 138/72 (BP Location: Left Arm, Patient Position: Sitting, Cuff Size: Normal)   Pulse 74   Ht 5' 6.5" (1.689 m)   Wt 174 lb 3.2 oz (79 kg)   SpO2 94%   BMI 27.70 kg/m    Review of Systems Denies LOC    Objective:   Physical Exam VITAL SIGNS:  See vs page GENERAL: no distress Pulses: dorsalis pedis intact bilat.   MSK: no deformity of the feet CV: no leg edema Skin:  no ulcer on the feet.  normal color and temp on the feet. Neuro: sensation is intact to touch on the feet.    A1c=6.0%     Assessment & Plan:  Type 1 DM: overcontrolled.   Hypoglycemia: this limits aggressiveness of glycemic control.   Patient Instructions  Please continue  the same basaglar, and:  Reduce the humalog to  10-12 units with breakfast and with lunch, and 12-13 units with supper. check your blood sugar 4 times a day: before the 3 meals, and at bedtime.  also check if you have symptoms of your blood sugar being too high or too low.  please keep a record of the readings and bring it to your next appointment here (or you can bring the meter itself).  You can write it on any piece of paper.  please call us sooner if your blood sugar goes below 70, or if you have a lot of readings over 200.   Please see Vaughan Basta, to consider a continuous glucose monitor.   Please come back for a follow-up appointment in 3 months.

## 2019-09-10 NOTE — Patient Instructions (Addendum)
Please continue the same basaglar, and:  Reduce the humalog to 10-12 units with breakfast and with lunch, and 12-13 units with supper. check your blood sugar 4 times a day: before the 3 meals, and at bedtime.  also check if you have symptoms of your blood sugar being too high or too low.  please keep a record of the readings and bring it to your next appointment here (or you can bring the meter itself).  You can write it on any piece of paper.  please call us sooner if your blood sugar goes below 70, or if you have a lot of readings over 200.   Please see Vaughan Basta, to consider a continuous glucose monitor.   Please come back for a follow-up appointment in 3 months.

## 2019-09-29 ENCOUNTER — Ambulatory Visit (INDEPENDENT_AMBULATORY_CARE_PROVIDER_SITE_OTHER): Payer: 59

## 2019-09-29 ENCOUNTER — Other Ambulatory Visit: Payer: Self-pay

## 2019-09-29 DIAGNOSIS — E538 Deficiency of other specified B group vitamins: Secondary | ICD-10-CM | POA: Diagnosis not present

## 2019-09-29 MED ORDER — CYANOCOBALAMIN 1000 MCG/ML IJ SOLN
1000.0000 ug | Freq: Once | INTRAMUSCULAR | Status: AC
  Administered 2019-09-29: 09:00:00 1000 ug via INTRAMUSCULAR

## 2019-09-29 NOTE — Progress Notes (Signed)
Per orders of Dr. Damita Dunnings, injection of Monthly B12 injection given by Kris Mouton. Patient tolerated injection well.

## 2019-10-04 ENCOUNTER — Other Ambulatory Visit: Payer: Self-pay | Admitting: Endocrinology

## 2019-11-02 ENCOUNTER — Other Ambulatory Visit: Payer: Self-pay | Admitting: Endocrinology

## 2019-11-03 ENCOUNTER — Other Ambulatory Visit: Payer: Self-pay

## 2019-11-03 ENCOUNTER — Ambulatory Visit (INDEPENDENT_AMBULATORY_CARE_PROVIDER_SITE_OTHER): Payer: 59 | Admitting: *Deleted

## 2019-11-03 DIAGNOSIS — E538 Deficiency of other specified B group vitamins: Secondary | ICD-10-CM | POA: Diagnosis not present

## 2019-11-03 MED ORDER — CYANOCOBALAMIN 1000 MCG/ML IJ SOLN
1000.0000 ug | Freq: Once | INTRAMUSCULAR | Status: AC
  Administered 2019-11-03: 1000 ug via INTRAMUSCULAR

## 2019-11-03 NOTE — Progress Notes (Signed)
Per orders of Dr. Damita Dunnings, injection of Vit B12 given by Nyoka Cowden, Ryan M. Patient tolerated injection well.

## 2019-11-11 ENCOUNTER — Encounter: Payer: Self-pay | Admitting: Family Medicine

## 2019-11-26 ENCOUNTER — Other Ambulatory Visit: Payer: Self-pay

## 2019-11-26 ENCOUNTER — Ambulatory Visit (INDEPENDENT_AMBULATORY_CARE_PROVIDER_SITE_OTHER): Payer: 59

## 2019-11-26 DIAGNOSIS — Z23 Encounter for immunization: Secondary | ICD-10-CM

## 2019-12-05 ENCOUNTER — Other Ambulatory Visit: Payer: Self-pay | Admitting: Endocrinology

## 2019-12-08 ENCOUNTER — Other Ambulatory Visit: Payer: Self-pay

## 2019-12-08 ENCOUNTER — Ambulatory Visit (INDEPENDENT_AMBULATORY_CARE_PROVIDER_SITE_OTHER): Payer: 59 | Admitting: *Deleted

## 2019-12-08 DIAGNOSIS — E538 Deficiency of other specified B group vitamins: Secondary | ICD-10-CM

## 2019-12-08 MED ORDER — CYANOCOBALAMIN 1000 MCG/ML IJ SOLN
1000.0000 ug | Freq: Once | INTRAMUSCULAR | Status: AC
  Administered 2019-12-08: 1000 ug via INTRAMUSCULAR

## 2019-12-08 NOTE — Progress Notes (Addendum)
Per orders of Dr. Duncan, injection of B12 1000 mcg/mL  given by Kathaleen Dudziak. Patient tolerated injection well.  

## 2019-12-14 ENCOUNTER — Ambulatory Visit (INDEPENDENT_AMBULATORY_CARE_PROVIDER_SITE_OTHER): Payer: BC Managed Care – PPO | Admitting: Endocrinology

## 2019-12-14 ENCOUNTER — Encounter: Payer: Self-pay | Admitting: Endocrinology

## 2019-12-14 ENCOUNTER — Other Ambulatory Visit: Payer: Self-pay

## 2019-12-14 VITALS — BP 126/70 | HR 77 | Ht 66.5 in | Wt 172.8 lb

## 2019-12-14 DIAGNOSIS — E10649 Type 1 diabetes mellitus with hypoglycemia without coma: Secondary | ICD-10-CM

## 2019-12-14 DIAGNOSIS — E119 Type 2 diabetes mellitus without complications: Secondary | ICD-10-CM | POA: Diagnosis not present

## 2019-12-14 LAB — POCT GLYCOSYLATED HEMOGLOBIN (HGB A1C): Hemoglobin A1C: 6.7 % — AB (ref 4.0–5.6)

## 2019-12-14 MED ORDER — INSULIN LISPRO (1 UNIT DIAL) 100 UNIT/ML (KWIKPEN): PEN_INJECTOR | SUBCUTANEOUS | 11 refills | Status: DC

## 2019-12-14 MED ORDER — BASAGLAR KWIKPEN 100 UNIT/ML ~~LOC~~ SOPN: 22.0000 [IU] | PEN_INJECTOR | Freq: Every day | SUBCUTANEOUS | 3 refills | Status: DC

## 2019-12-14 NOTE — Patient Instructions (Signed)
Please change the insulins to the numbers listed below. check your blood sugar 4 times a day: before the 3 meals, and at bedtime.  also check if you have symptoms of your blood sugar being too high or too low.  please keep a record of the readings and bring it to your next appointment here (or you can bring the meter itself).  You can write it on any piece of paper.  please call us sooner if your blood sugar goes below 70, or if you have a lot of readings over 200. Please come back for a follow-up appointment in 3-4 months

## 2019-12-14 NOTE — Progress Notes (Signed)
 Subjective:    Patient ID: Ryan Crane, male    DOB: 04/05/1956, 64 y.o.   MRN: [id number]  HPI Pt returns for f/u of diabetes mellitus:  DM type: 1 Dx'ed: 2013 Complications: none Therapy: insulin since 2019, and januvia.  DKA: never.  Severe hypoglycemia: 1 possible episode 2020 Pancreatitis: never Pancreatic imaging: no mention is made on 2018 abd [place].  Other: he takes multiple daily injections; he declines pump and continuous glucose monitor for now.   Interval history:  He takes insulin as rx'ed.  He brings a record of his cbg's which I have reviewed today.  cbg varies from 59-382.  It is in general highest at HS, and lowest in the afternoon.     Past Medical History:  Diagnosis Date  . Allergy   . Arthritis   . Cholelithiasis    symptomatic  . Diabetes mellitus ([facility])   . Family history of malignant neoplasm of gastrointestinal tract   . Fatty liver   . GERD (gastroesophageal reflux disease)   . Graves disease    s/p ablation  . Hemochromatosis   . History of chicken pox   . History of hiatal hernia   . History of kidney stones   . Hyperlipidemia   . Hypertension   . Internal hemorrhoids   . Kidney stones    . Personal history of colonic polyps 03/31/2008   tubular adenoma  . Right fibular fracture 2013    Past Surgical History:  Procedure Laterality Date  . CHOLECYSTECTOMY N/A 05/29/2017   Procedure: LAPAROSCOPIC CHOLECYSTECTOMY;  Surgeon: Connor, Chelsea A, MD;  Location: [facility];  Service: General;  Laterality: N/A;  . COLONOSCOPY W/ BIOPSIES AND POLYPECTOMY    . LITHOTRIPSY    . URETERAL STENT PLACEMENT     left    Social History   Socioeconomic History  . Marital status: Married    Spouse name: Not on file  . Number of children: 1  . Years of education: Not on file  . Highest education level: Not on file  Occupational History  . Occupation: PROJECT MANAGER    Employer: [employer]  Tobacco Use  . Smoking status: Never Smoker  . Smokeless tobacco:  Never Used  Substance and Sexual Activity  . Alcohol use: No    Alcohol/week: 0.0 standard drinks  . Drug use: No  . Sexual activity: Yes  Other Topics Concern  . Not on file  Social History Narrative   2 years of college   Manager at [employer]   Married 1977   1 daughter   Social Determinants of Health   Financial Resource Strain:   . Difficulty of Paying Living Expenses: Not on file  Food Insecurity:   . Worried About Running Out of Food in the Last Year: Not on file  . Ran Out of Food in the Last Year: Not on file  Transportation Needs:   . Lack of Transportation (Medical): Not on file  . Lack of Transportation (Non-Medical): Not on file  Physical Activity:   . Days of Exercise per Week: Not on file  . Minutes of Exercise per Session: Not on file  Stress:   . Feeling of Stress : Not on file  Social Connections:   . Frequency of Communication with Friends and Family: Not on file  . Frequency of Social Gatherings with Friends and Family: Not on file  . Attends Religious Services: Not on file  . Active Member of Clubs or Organizations:   Not on file  . Attends Club or Organization Meetings: Not on file  . Marital Status: Not on file  Intimate Partner Violence:   . Fear of Current or Ex-Partner: Not on file  . Emotionally Abused: Not on file  . Physically Abused: Not on file  . Sexually Abused: Not on file    Current Outpatient Medications on File Prior to Visit  Medication Sig Dispense Refill  . albuterol (VENTOLIN HFA) 108 (90 Base) MCG/ACT inhaler INHALE 1-2 PUFFS INTO THE LUNGS EVERY 6 (SIX) HOURS AS NEEDED FOR WHEEZING OR SHORTNESS OF BREATH. 1 Inhaler 3  . aspirin 81 MG tablet Take 81 mg by mouth daily.      . BD PEN NEEDLE NANO U/F 32G X 4 MM MISC USE PEN NEEDLES AS DIRECTED ONCE DAILY 90 each 3  . Blood Glucose Monitoring Suppl (ONETOUCH VERIO) w/Device KIT 1 kit by Does not apply route daily. Use to test blood sugar once daily Dx E11.9 1 kit 0  . cyanocobalamin  (,VITAMIN B-12,) 1000 MCG/ML injection Inject 1,000 mcg into the muscle every 30 (thirty) days.    . diclofenac sodium (VOLTAREN) 1 % GEL Apply 2 g topically 4 (four) times daily as needed. 100 g 12  . fenofibrate 160 MG tablet TAKE 1 TABLET BY MOUTH EVERY DAY 30 tablet 11  . fluticasone (FLONASE) 50 MCG/ACT nasal spray Place 1 spray into both nostrils daily.    . Lancets (ONETOUCH DELICA PLUS LANCET33G) MISC USE TO TEST BLOOD SUGARS 7 TIMES DAILY 200 each 6  . lisinopril (ZESTRIL) 20 MG tablet TAKE 1 TABLET BY MOUTH EVERY DAY 90 tablet 2  . omeprazole (PRILOSEC) 40 MG capsule 1 tab a day as needed. 30 capsule 3  . ONETOUCH VERIO test strip USE TO TEST BLOOD SUGARS AS DIRECTED 7 TIMES A DAY. 100 strip 11  . oxyCODONE-acetaminophen (PERCOCET/ROXICET) 5-325 MG tablet Take 1-2 tablets by mouth every 6 (six) hours as needed (for kidney stones). 10 tablet 0  . SYNTHROID 150 MCG tablet TAKE 1 TABLET (150 MCG TOTAL) BY MOUTH DAILY BEFORE BREAKFAST. 30 tablet 11   No current facility-administered medications on file prior to visit.    Allergies  Allergen Reactions  . Flomax [Tamsulosin Hcl]     nausea    Family History  Problem Relation Age of Onset  . COPD Mother   . Arthritis Mother   . Diabetes Mother   . Hypertension Mother   . Lung cancer Father   . Colon cancer Maternal Grandmother 75  . Prostate cancer Neg Hx   . Esophageal cancer Neg Hx   . Rectal cancer Neg Hx   . Stomach cancer Neg Hx   . Thyroid disease Neg Hx     BP 126/70 (BP Location: Right Arm, Patient Position: Sitting, Cuff Size: Normal)   Pulse 77   Ht 5' 6.5" (1.689 m)   Wt 172 lb 12.8 oz (78.4 kg)   SpO2 96%   BMI 27.47 kg/m    Review of Systems He denies LOC    Objective:   Physical Exam VITAL SIGNS:  See vs page GENERAL: no distress Pulses: dorsalis pedis intact bilat.   MSK: no deformity of the feet CV: trace bilat leg edema.   Skin:  no ulcer on the feet.  normal color and temp on the  feet. Neuro: sensation is intact to touch on the feet.    A1c=6.7%     Assessment & Plan:  Type 1 DM: The   pattern of his cbg's indicates he needs some adjustment in his therapy.  Hypoglycemia: this limits aggressiveness of glycemic control.    Patient Instructions  Please change the insulins to the numbers listed below. check your blood sugar 4 times a day: before the 3 meals, and at bedtime.  also check if you have symptoms of your blood sugar being too high or too low.  please keep a record of the readings and bring it to your next appointment here (or you can bring the meter itself).  You can write it on any piece of paper.  please call us sooner if your blood sugar goes below 70, or if you have a lot of readings over 200. Please come back for a follow-up appointment in 3-4 months

## 2019-12-15 ENCOUNTER — Telehealth: Payer: Self-pay

## 2019-12-15 NOTE — Telephone Encounter (Signed)
APPROVAL - HUMALOG KWIKPEN  Received notification from [place] that [place] for [place] has been approved.  Ryan Crane KeySK:[id number] - Rx #[id number] Need help? Call us at [phone] Outcome Approvedtoday Effective from 12/15/2019 through 12/13/2022. Drug HumaLOG KwikPen 100UNIT/ML pen-injectors Form Blue Building control surveyor Form (CB) Original Claim Info 75 PA REQUIRED. USE NOVO (NO PA REQUIRED)

## 2019-12-15 NOTE — Telephone Encounter (Signed)
PRIOR AUTHORIZATION  PA initiation date: 12/15/19   Insurance Company: [place] completed electronically through [employer] My Meds: Yes  Will await insurance response re: approval/denial.  Ryan Crane (KeyRK:[id number]) Rx #[id number] Basaglar KwikPen 100UNIT/ML pen-injectors   Form Librarian, academic Form (CB) Created 1 day ago Sent to Plan 2 minutes ago Plan Response 2 minutes ago Submit Clinical Questions less than a minute ago Determination Wait for Determination Please wait for [employer] [place] Commercial cb central to return a determination.

## 2019-12-15 NOTE — Telephone Encounter (Signed)
PRIOR AUTHORIZATION - Humalog Kwikpen  PA initiation date: 12/15/19   Insurance Company: [employer] [place] completed electronically through [employer] My Meds: Yes  Will await insurance response re: approval/denial.  G[place]ham Penagos (KeyYH:[id number])  Your information has been submitted to [place]. [employer] [place] will review the request and fax you a determination directly, typically within 3 business days of your submission once all necessary information is received.  If [employer] [facility] has not responded in 3 business days or if you have any questions about your submission, contact [place] at [phone]. Adewale Fredderick Severance KeyYH:[id number] - Rx #[id number] Need help? Call us at [phone] Status Sent to [facility] Drug HumaLOG KwikPen 100UNIT/ML pen-injectors Form Blue Building control surveyor Form (CB) Original Claim Info 75 PA REQUIRED. USE NOVO (NO PA REQUIRED)

## 2019-12-15 NOTE — Telephone Encounter (Signed)
[place]  Received notification from [place] that [place] for N[place]e Liter has been denied. Placed letter on Dr. Cordelia Pen desk for him to review and to advise. Will await his response. PA for Humalog Claiborne Rigg still remains pending a response.

## 2019-12-16 ENCOUNTER — Other Ambulatory Visit: Payer: Self-pay | Admitting: Endocrinology

## 2019-12-16 ENCOUNTER — Other Ambulatory Visit: Payer: Self-pay

## 2019-12-16 DIAGNOSIS — E10649 Type 1 diabetes mellitus with hypoglycemia without coma: Secondary | ICD-10-CM

## 2019-12-16 DIAGNOSIS — E119 Type 2 diabetes mellitus without complications: Secondary | ICD-10-CM

## 2019-12-16 MED ORDER — INSULIN GLARGINE 100 UNIT/ML ~~LOC~~ SOLN: 22.0000 [IU] | Freq: Every day | SUBCUTANEOUS | 2 refills | Status: DC

## 2019-12-16 MED ORDER — "INSULIN SYRINGE-NEEDLE U-100 30G X 1/2"" 0.5 ML MISC": 1.0000 | Freq: Every day | 0 refills | Status: AC

## 2019-12-16 NOTE — Telephone Encounter (Signed)
Dr. Loanne Drilling reviewed denial letter for Basaglar and has agreed with insurance recommendation to change Rx to Lantus.

## 2019-12-16 NOTE — Telephone Encounter (Signed)
insulin glargine (LANTUS) 100 UNIT/ML injection 6 mL 2 12/16/2019    Sig - Route: Inject 0.22 mLs (22 Units total) into the skin at bedtime. - Subcutaneous   Sent to pharmacy as: insulin glargine (LANTUS) 100 UNIT/ML injection   E-Prescribing Status: Receipt confirmed by pharmacy (12/16/2019 7:46 AM EST)    Above Rx sent along with syringes to replace Basaglar (denied by insurance)

## 2019-12-16 NOTE — Addendum Note (Signed)
Addended by: Aleatha Borer J on: 12/16/2019 07:43 AM   Modules accepted: Orders

## 2020-01-10 ENCOUNTER — Encounter: Payer: Self-pay | Admitting: Family Medicine

## 2020-01-10 ENCOUNTER — Encounter: Payer: Self-pay | Admitting: Endocrinology

## 2020-01-11 ENCOUNTER — Other Ambulatory Visit: Payer: Self-pay | Admitting: Endocrinology

## 2020-01-11 MED ORDER — LANTUS SOLOSTAR 100 UNIT/ML ~~LOC~~ SOPN: 22.0000 [IU] | PEN_INJECTOR | Freq: Every day | SUBCUTANEOUS | 99 refills | Status: DC

## 2020-01-12 ENCOUNTER — Other Ambulatory Visit: Payer: Self-pay | Admitting: Endocrinology

## 2020-01-12 ENCOUNTER — Other Ambulatory Visit: Payer: Self-pay | Admitting: Family Medicine

## 2020-01-12 ENCOUNTER — Other Ambulatory Visit: Payer: Self-pay

## 2020-01-12 ENCOUNTER — Ambulatory Visit (INDEPENDENT_AMBULATORY_CARE_PROVIDER_SITE_OTHER): Payer: BC Managed Care – PPO | Admitting: *Deleted

## 2020-01-12 DIAGNOSIS — E538 Deficiency of other specified B group vitamins: Secondary | ICD-10-CM

## 2020-01-12 DIAGNOSIS — E10649 Type 1 diabetes mellitus with hypoglycemia without coma: Secondary | ICD-10-CM

## 2020-01-12 MED ORDER — CYANOCOBALAMIN 1000 MCG/ML IJ SOLN
1000.0000 ug | Freq: Once | INTRAMUSCULAR | Status: AC
  Administered 2020-01-12: 1000 ug via INTRAMUSCULAR

## 2020-01-12 NOTE — Progress Notes (Signed)
Per orders of Dr. Damita Dunnings, injection of Vit B12 given by Nyoka Cowden, Raksha Wolfgang M. Patient tolerated injection well.

## 2020-01-16 ENCOUNTER — Other Ambulatory Visit: Payer: Self-pay | Admitting: Family Medicine

## 2020-02-16 ENCOUNTER — Other Ambulatory Visit: Payer: Self-pay

## 2020-02-16 ENCOUNTER — Ambulatory Visit (INDEPENDENT_AMBULATORY_CARE_PROVIDER_SITE_OTHER): Payer: BC Managed Care – PPO | Admitting: *Deleted

## 2020-02-16 DIAGNOSIS — E538 Deficiency of other specified B group vitamins: Secondary | ICD-10-CM | POA: Diagnosis not present

## 2020-02-16 MED ORDER — CYANOCOBALAMIN 1000 MCG/ML IJ SOLN
1000.0000 ug | Freq: Once | INTRAMUSCULAR | Status: AC
  Administered 2020-02-16: 1000 ug via INTRAMUSCULAR

## 2020-02-16 NOTE — Progress Notes (Signed)
Per orders of Dr. Damita Dunnings, injection of Vit B12 given by Nyoka Cowden, Shalona Harbour M. Patient tolerated injection well.

## 2020-03-16 ENCOUNTER — Other Ambulatory Visit: Payer: Self-pay | Admitting: Family Medicine

## 2020-03-16 DIAGNOSIS — E119 Type 2 diabetes mellitus without complications: Secondary | ICD-10-CM

## 2020-03-16 DIAGNOSIS — Z125 Encounter for screening for malignant neoplasm of prostate: Secondary | ICD-10-CM

## 2020-03-16 DIAGNOSIS — E538 Deficiency of other specified B group vitamins: Secondary | ICD-10-CM

## 2020-03-17 ENCOUNTER — Other Ambulatory Visit: Payer: Self-pay

## 2020-03-21 ENCOUNTER — Encounter: Payer: Self-pay | Admitting: Endocrinology

## 2020-03-21 ENCOUNTER — Other Ambulatory Visit: Payer: Self-pay

## 2020-03-21 ENCOUNTER — Ambulatory Visit: Payer: BC Managed Care – PPO | Admitting: Endocrinology

## 2020-03-21 VITALS — BP 110/80 | HR 68 | Ht 66.5 in | Wt 170.4 lb

## 2020-03-21 DIAGNOSIS — E10649 Type 1 diabetes mellitus with hypoglycemia without coma: Secondary | ICD-10-CM | POA: Diagnosis not present

## 2020-03-21 LAB — POCT GLYCOSYLATED HEMOGLOBIN (HGB A1C): Hemoglobin A1C: 6.7 % — AB (ref 4.0–5.6)

## 2020-03-21 MED ORDER — INSULIN LISPRO (1 UNIT DIAL) 100 UNIT/ML (KWIKPEN): PEN_INJECTOR | SUBCUTANEOUS | 11 refills | Status: DC

## 2020-03-21 NOTE — Progress Notes (Signed)
Subjective:    Patient ID: Ryan Crane, male    DOB: [birth year], 64 y.o.   MRN: [id number]  HPI Pt returns for f/u of diabetes mellitus:  DM type: 1 Dx'ed: 1275 Complications: CRI Therapy: insulin since 2019, and januvia.  DKA: never.  Severe hypoglycemia: 1 possible episode 2020 Pancreatitis: never Pancreatic imaging: no mention is made on 2018 abd [place].  Other: he takes multiple daily injections; he declines pump and continuous glucose monitor for now.   Interval history:  He takes insulin as rx'ed.  He brings a record of his cbg's which I have reviewed today.  cbg varies from 56-297.  It is in general lowest after breakfast and after lunch.  Past Medical History:  Diagnosis Date  . Allergy   . Arthritis   . Cholelithiasis    symptomatic  . Diabetes mellitus ([place])   . Family history of malignant neoplasm of gastrointestinal tract   . Fatty liver   . GERD (gastroesophageal reflux disease)   . Graves disease    s/p ablation  . Hemochromatosis   . History of chicken pox   . History of hiatal hernia   . History of kidney stones   . Hyperlipidemia   . Hypertension   . Internal hemorrhoids   . Kidney stones    . Personal history of colonic polyps 03/31/2008   tubular adenoma  . Right fibular fracture 2013    Past Surgical History:  Procedure Laterality Date  . CHOLECYSTECTOMY N/A 05/29/2017   Procedure: LAPAROSCOPIC CHOLECYSTECTOMY;  Surgeon: Clovis Riley, MD;  Location: [facility];  Service: General;  Laterality: N/A;  . COLONOSCOPY W/ BIOPSIES AND POLYPECTOMY    . LITHOTRIPSY    . URETERAL STENT PLACEMENT     left    Social History   Socioeconomic History  . Marital status: Married    Spouse name: Not on file  . Number of children: 1  . Years of education: Not on file  . Highest education level: Not on file  Occupational History  . 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 Smoking status: Never Smoker  . Smokeless tobacco: Never  Used  Substance and Sexual Activity  . Alcohol use: No    Alcohol/week: 0.0 standard drinks  . Drug use: No  . Sexual activity: Yes  Other Topics Concern  . 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 Difficulty of Paying Living Expenses:   Food Insecurity:   . Worried About Charity fundraiser in the Last Year:   . Arboriculturist in the Last Year:   Transportation Needs:   . Film/video editor (Medical):   Marland Kitchen Lack of Transportation (Non-Medical):   Physical Activity:   . Days of Exercise per Week:   . Minutes of Exercise per Session:   Stress:   . Feeling of Stress :   Social Connections:   . Frequency of Communication with Friends and Family:   . Frequency of Social Gatherings with Friends and Family:   . Attends Religious Services:   . Active Member of Clubs or Organizations:   . Attends Archivist Meetings:   Marland Kitchen Marital Status:   Intimate Partner Violence:   . Fear of Current or Ex-Partner:   .  Emotionally Abused:   Marland Kitchen Physically Abused:   . Sexually Abused:     Current Outpatient Medications on File Prior to Visit  Medication Sig Dispense Refill  . albuterol (VENTOLIN HFA) 108 (90 Base) MCG/ACT inhaler INHALE 1-2 PUFFS INTO THE LUNGS EVERY 6 (SIX) HOURS AS NEEDED FOR WHEEZING OR SHORTNESS OF BREATH. 18 g 3  . aspirin 81 MG tablet Take 81 mg by mouth daily.      . BD PEN NEEDLE NANO U/F 32G X 4 MM MISC USE PEN NEEDLES AS DIRECTED ONCE DAILY 90 each 3  . Blood Glucose Monitoring Suppl (ONETOUCH VERIO) w/Device KIT 1 kit by Does not apply route daily. Use to test blood sugar once daily Dx E11.9 1 kit 0  . cyanocobalamin (,VITAMIN B-12,) 1000 MCG/ML injection Inject 1,000 mcg into the muscle every 30 (thirty) days.    . diclofenac sodium (VOLTAREN) 1 % GEL Apply 2 g topically 4 (four) times daily as needed. 100 g 12  .  fenofibrate 160 MG tablet TAKE 1 TABLET BY MOUTH EVERY DAY 30 tablet 11  . fluticasone (FLONASE) 50 MCG/ACT nasal spray Place 1 spray into both nostrils daily.    . Insulin Glargine (LANTUS SOLOSTAR) 100 UNIT/ML Solostar Pen Inject 22 Units into the skin daily. 5 pen PRN  . Insulin Syringe-Needle U-100 30G X 1/2" 0.5 ML MISC 1 each by Does not apply route daily. E11.9 90 each 0  . Lancets (ONETOUCH DELICA PLUS [id number]) MISC USE TO TEST BLOOD SUGARS 7 TIMES DAILY 200 each 6  . lisinopril (ZESTRIL) 20 MG tablet TAKE 1 TABLET BY MOUTH EVERY DAY 90 tablet 2  . omeprazole (PRILOSEC) 40 MG capsule TAKE 1 CAPSULE BY MOUTH EVERY DAY 30 capsule 3  . ONETOUCH VERIO test strip USE TO TEST BLOOD SUGARS AS DIRECTED 7 TIMES A DAY. 100 strip 11  . oxyCODONE-acetaminophen (PERCOCET/ROXICET) 5-325 MG tablet Take 1-2 tablets by mouth every 6 (six) hours as needed (for kidney stones). 10 tablet 0  . SYNTHROID 150 MCG tablet TAKE 1 TABLET (150 MCG TOTAL) BY MOUTH DAILY BEFORE BREAKFAST. 30 tablet 11   No current facility-administered medications on file prior to visit.    Allergies  Allergen Reactions  . Flomax [Tamsulosin Hcl]     nausea    Family History  Problem Relation Age of Onset  . COPD Mother   . Arthritis Mother   . Diabetes Mother   . Hypertension Mother   . Lung cancer Father   . Colon cancer Maternal Grandmother 65  . Prostate cancer Neg Hx   . Esophageal cancer Neg Hx   . Rectal cancer Neg Hx   . Stomach cancer Neg Hx   . Thyroid disease Neg Hx     BP 110/80 (BP Location: Left Arm, Patient Position: Sitting, Cuff Size: Normal)   Pulse 68   Ht 5' 6.5" (1.689 m)   Wt 170 lb 6.4 oz (77.3 kg)   SpO2 97%   BMI 27.09 kg/m    Review of Systems Denies LOC.     Objective:   Physical Exam VITAL SIGNS:  See vs page GENERAL: no distress Pulses: dorsalis pedis intact bilat.   MSK: no deformity of the feet CV: no leg edema Skin:  no ulcer on the feet.  normal color and temp on the  feet. Neuro: sensation is intact to touch on the feet  Lab Results  Component Value Date   HGBA1C 6.7 (A) 03/21/2020   Lab Results  Component Value Date   TSH 0.75 04/09/2019   Lab Results  Component Value Date   CREATININE 1.30 04/09/2019   BUN 21 04/09/2019   NA 139 04/09/2019   K 4.4 04/09/2019   CL 102 04/09/2019   CO2 28 04/09/2019       Assessment & Plan:  Type 1 DM: well-controlled Hypoglycemia: this limits aggressiveness of glycemic control CRI: in this setting, he needs mostly mealtime insulin  Patient Instructions  Please reduce the lunch insulin to 11 units.  check your blood sugar 4 times a day: before the 3 meals, and at bedtime.  also check if you have symptoms of your blood sugar being too high or too low.  please keep a record of the readings and bring it to your next appointment here (or you can bring the meter itself).  You can write it on any piece of paper.  please call us sooner if your blood sugar goes below 70, or if you have a lot of readings over 200.  Please see Dr Damita Dunnings for your annual physical, as your blood tests are due soon.  Please come back for a follow-up appointment in 3-4 months.

## 2020-03-21 NOTE — Patient Instructions (Addendum)
Please reduce the lunch insulin to 11 units.  check your blood sugar 4 times a day: before the 3 meals, and at bedtime.  also check if you have symptoms of your blood sugar being too high or too low.  please keep a record of the readings and bring it to your next appointment here (or you can bring the meter itself).  You can write it on any piece of paper.  please call us sooner if your blood sugar goes below 70, or if you have a lot of readings over 200.  Please see Dr Damita Dunnings for your annual physical, as your blood tests are due soon.  Please come back for a follow-up appointment in 3-4 months.

## 2020-03-27 ENCOUNTER — Other Ambulatory Visit: Payer: Self-pay | Admitting: Family Medicine

## 2020-03-28 ENCOUNTER — Telehealth: Payer: Self-pay

## 2020-03-28 ENCOUNTER — Other Ambulatory Visit (INDEPENDENT_AMBULATORY_CARE_PROVIDER_SITE_OTHER): Payer: BC Managed Care – PPO

## 2020-03-28 ENCOUNTER — Other Ambulatory Visit: Payer: Self-pay

## 2020-03-28 DIAGNOSIS — E119 Type 2 diabetes mellitus without complications: Secondary | ICD-10-CM | POA: Diagnosis not present

## 2020-03-28 DIAGNOSIS — Z125 Encounter for screening for malignant neoplasm of prostate: Secondary | ICD-10-CM

## 2020-03-28 DIAGNOSIS — E538 Deficiency of other specified B group vitamins: Secondary | ICD-10-CM | POA: Diagnosis not present

## 2020-03-28 LAB — COMPREHENSIVE METABOLIC PANEL
ALT: 18 U/L (ref 0–53)
AST: 26 U/L (ref 0–37)
Albumin: 4.4 g/dL (ref 3.5–5.2)
Alkaline Phosphatase: 95 U/L (ref 39–117)
BUN: 19 mg/dL (ref 6–23)
CO2: 28 mEq/L (ref 19–32)
Calcium: 9.2 mg/dL (ref 8.4–10.5)
Chloride: 106 mEq/L (ref 96–112)
Creatinine, Ser: 1.13 mg/dL (ref 0.40–1.50)
GFR: 65.02 mL/min (ref >60.00)
Glucose, Bld: 91 mg/dL (ref 70–99)
Potassium: 4.2 mEq/L (ref 3.5–5.1)
Sodium: 140 mEq/L (ref 135–145)
Total Bilirubin: 0.7 mg/dL (ref 0.2–1.2)
Total Protein: 6.6 g/dL (ref 6.0–8.3)

## 2020-03-28 LAB — CBC WITH DIFFERENTIAL/PLATELET
Basophils Absolute: 0 10*3/uL (ref 0.0–0.1)
Basophils Relative: 0.8 % (ref 0.0–3.0)
Eosinophils Absolute: 0.2 10*3/uL (ref 0.0–0.7)
Eosinophils Relative: 4.6 % (ref 0.0–5.0)
HCT: 43 % (ref 39.0–52.0)
Hemoglobin: 15.2 g/dL (ref 13.0–17.0)
Lymphocytes Relative: 29.7 % (ref 12.0–46.0)
Lymphs Abs: 1.4 10*3/uL (ref 0.7–4.0)
MCHC: 35.4 g/dL (ref 30.0–36.0)
MCV: 96.3 fl (ref 78.0–100.0)
Monocytes Absolute: 0.5 10*3/uL (ref 0.1–1.0)
Monocytes Relative: 11 % (ref 3.0–12.0)
Neutro Abs: 2.6 10*3/uL (ref 1.4–7.7)
Neutrophils Relative %: 53.9 % (ref 43.0–77.0)
Platelets: 184 10*3/uL (ref 150.0–400.0)
RBC: 4.47 Mil/uL (ref 4.22–5.81)
RDW: 12.8 % (ref 11.5–15.5)
WBC: 4.9 10*3/uL (ref 4.0–10.5)

## 2020-03-28 LAB — LIPID PANEL
Cholesterol: 170 mg/dL (ref 0–200)
HDL: 40.4 mg/dL (ref >39.00)
LDL Cholesterol: 104 mg/dL — ABNORMAL HIGH (ref 0–99)
NonHDL: 129.36
Total CHOL/HDL Ratio: 4
Triglycerides: 126 mg/dL (ref 0.0–149.0)
VLDL: 25.2 mg/dL (ref 0.0–40.0)

## 2020-03-28 LAB — TSH: TSH: 0.29 u[IU]/mL — ABNORMAL LOW (ref 0.35–4.50)

## 2020-03-28 LAB — FERRITIN: Ferritin: 33.8 ng/mL (ref 22.0–322.0)

## 2020-03-28 LAB — VITAMIN B12: Vitamin B-12: 447 pg/mL (ref 211–911)

## 2020-03-28 LAB — HEMOGLOBIN A1C: Hgb A1c MFr Bld: 6.8 % — ABNORMAL HIGH (ref 4.6–6.5)

## 2020-03-28 LAB — PSA: PSA: 1.35 ng/mL (ref 0.10–4.00)

## 2020-03-28 NOTE — Telephone Encounter (Signed)
[facility] Night - Client Nonclinical Telephone Record AccessNurse Client [facility] Night - Client Client Site [facility] Physician Renford Dills - MD Contact Type Call Who Is Calling Patient / Member / Family / Caregiver Caller Name Calib Wegley Caller Phone Number [phone] Patient Name Ryan Crane Patient DOB 03-Feb-1956 Call Type Message Only Information Provided Reason for Call Request for General Office Information Initial Comment Caller states he is there for is 7:50 lab. Additional Comment Provided the caller with the office hours. Caller states he is waiting outside. Disp. Time Disposition Final User 03/28/2020 7:50:16 AM General Information Provided Yes Luan Pulling, Dawn Call Closed By: Epifania Gore Transaction Date/Time: 03/28/2020 7:47:36 AM (ET)

## 2020-03-28 NOTE — Telephone Encounter (Signed)
Per lab tab pts lab has already been collected on 03/28/20. FYI to [facility].

## 2020-03-29 ENCOUNTER — Other Ambulatory Visit: Payer: 59

## 2020-03-30 ENCOUNTER — Ambulatory Visit (INDEPENDENT_AMBULATORY_CARE_PROVIDER_SITE_OTHER): Payer: BC Managed Care – PPO

## 2020-03-30 DIAGNOSIS — E538 Deficiency of other specified B group vitamins: Secondary | ICD-10-CM

## 2020-03-30 MED ORDER — CYANOCOBALAMIN 1000 MCG/ML IJ SOLN
1000.0000 ug | Freq: Once | INTRAMUSCULAR | Status: AC
  Administered 2020-03-30: 1000 ug via INTRAMUSCULAR

## 2020-03-30 NOTE — Progress Notes (Signed)
Ryan Crane out of the office, sent to Dr Danise Mina in his absence  Per orders of Dr. Elsie Stain, injection of B12 given by Lurlean Nanny. Patient tolerated injection well.

## 2020-04-05 ENCOUNTER — Encounter: Payer: 59 | Admitting: Family Medicine

## 2020-04-11 ENCOUNTER — Other Ambulatory Visit: Payer: Self-pay | Admitting: Endocrinology

## 2020-04-11 ENCOUNTER — Encounter: Payer: Self-pay | Admitting: Family Medicine

## 2020-04-12 ENCOUNTER — Other Ambulatory Visit: Payer: Self-pay

## 2020-04-12 DIAGNOSIS — E10649 Type 1 diabetes mellitus with hypoglycemia without coma: Secondary | ICD-10-CM

## 2020-04-12 MED ORDER — ACCU-CHEK SOFTCLIX LANCETS MISC: 1.0000 | Freq: Four times a day (QID) | 0 refills | Status: DC

## 2020-04-12 MED ORDER — ACCU-CHEK GUIDE ME W/DEVICE KIT: 1.0000 | PACK | Freq: Four times a day (QID) | 0 refills | Status: DC

## 2020-04-12 MED ORDER — ACCU-CHEK GUIDE VI STRP: 1.0000 | ORAL_STRIP | Freq: Four times a day (QID) | 0 refills | Status: DC

## 2020-04-18 ENCOUNTER — Encounter: Payer: Self-pay | Admitting: Family Medicine

## 2020-04-18 ENCOUNTER — Other Ambulatory Visit: Payer: Self-pay

## 2020-04-18 ENCOUNTER — Ambulatory Visit (INDEPENDENT_AMBULATORY_CARE_PROVIDER_SITE_OTHER): Payer: BC Managed Care – PPO | Admitting: Family Medicine

## 2020-04-18 VITALS — BP 142/82 | HR 66 | Temp 97.2°F | Ht 66.5 in | Wt 172.0 lb

## 2020-04-18 DIAGNOSIS — E119 Type 2 diabetes mellitus without complications: Secondary | ICD-10-CM

## 2020-04-18 DIAGNOSIS — Z0000 Encounter for general adult medical examination without abnormal findings: Secondary | ICD-10-CM | POA: Diagnosis not present

## 2020-04-18 DIAGNOSIS — Z7189 Other specified counseling: Secondary | ICD-10-CM

## 2020-04-18 DIAGNOSIS — N200 Calculus of kidney: Secondary | ICD-10-CM

## 2020-04-18 DIAGNOSIS — E538 Deficiency of other specified B group vitamins: Secondary | ICD-10-CM

## 2020-04-18 DIAGNOSIS — E0500 Thyrotoxicosis with diffuse goiter without thyrotoxic crisis or storm: Secondary | ICD-10-CM

## 2020-04-18 MED ORDER — OXYCODONE-ACETAMINOPHEN 5-325 MG PO TABS: 1.0000 | ORAL_TABLET | Freq: Four times a day (QID) | ORAL | 0 refills | Status: DC | PRN

## 2020-04-18 NOTE — Patient Instructions (Addendum)
Take care.  Glad to see you. Check with your insurance to see if they will cover the shingrix shot. Don't change your meds for now.  Update me as needed.  I'll update Dr. Loanne Drilling.

## 2020-04-18 NOTE — Progress Notes (Signed)
This visit occurred during the SARS-CoV-2 public health emergency.  Safety protocols were in place, including screening questions prior to the visit, additional usage of staff PPE, and extensive cleaning of exam room while observing appropriate contact time as indicated for disinfecting solutions.  CPE- See plan.  Routine anticipatory guidance given to patient.  See health maintenance.  The possibility exists that previously documented standard health maintenance information may have been brought forward from a previous encounter into this note.  If needed, that same information has been updated to reflect the current situation based on today's encounter.    Tetanus 2019  Flu done yearly  Shingles shot d/w pt.   PNA shot 2017 covid vaccine d/w pt.   Colonoscopy 2013  PSA wnl Living will d/w pt. Would have his wife designated if pt were incapacitated. No falls.  No hearing deficits noted by patient.   Mood is stable.   Still working.  He has some occ lapses of memory but is still working.  He isn't making mistakes at work.    Knows year, month, day.  3/3 attention and recall, can do math, can spell WORLD backward  Diabetes:  Using medications without difficulties: yes Hypoglycemic episodes: rare lows at night, cautions d/w pt.  Maybe once a month, below 100, managed with a snack.  He has awareness of sx.   Hyperglycemic episodes:no Feet problems:no Blood Sugars averaging: 130-200 in the AMs, variable in the AMs.   eye exam within last year: d/w pt.   Labs d/w pt.  A1c controlled.   Hemochromatosis.  No recent phlebotomy.  He is managing with diet.  Ferritin controlled. No vomiting, no blood in stool.    B12 def.  B12 level wnl.  Compliant.  No adverse effect on replacement.  Hypertension:    Using medication without problems or lightheadedness: yes Chest pain with exertion:no Edema:no Short of breath:no Labs d/w pt.    Hypothyroidism.  Minimally low TSH.  Compliant.  No neck  mass, no lumps.  No dysphagia.    H/o renal stones.  Has rx for oxycodone to use prn, d/w pt.  No ADE on med prev.    PMH and SH reviewed  Meds, vitals, and allergies reviewed.   ROS: Per HPI.  Unless specifically indicated otherwise in HPI, the patient denies:  General: fever. Eyes: acute vision changes ENT: sore throat Cardiovascular: chest pain Respiratory: SOB GI: vomiting GU: dysuria Musculoskeletal: acute back pain Derm: acute rash Neuro: acute motor dysfunction Psych: worsening mood Endocrine: polydipsia Heme: bleeding Allergy: hayfever  GEN: nad, alert and oriented HEENT: ncat NECK: supple w/o LA CV: rrr. PULM: ctab, no inc wob ABD: soft, +bs EXT: no edema SKIN: no acute rash

## 2020-04-22 ENCOUNTER — Telehealth: Payer: Self-pay | Admitting: Family Medicine

## 2020-04-22 NOTE — Telephone Encounter (Signed)
Placed in Dr. Duncan's In Box. 

## 2020-04-22 NOTE — Telephone Encounter (Signed)
Patient dropped of Proof of Physical Form. Patient requests call when form is ready for pick up.  Form is in rx tower.

## 2020-04-25 NOTE — Telephone Encounter (Signed)
Form signed. Thanks!

## 2020-04-26 NOTE — Assessment & Plan Note (Signed)
TSH minimally low.  Compliant.  No neck mass.  We can recheck TSH with next set of labs.

## 2020-04-26 NOTE — Assessment & Plan Note (Signed)
Tetanus 2019  Flu done yearly  Shingles shot d/w pt.   PNA shot 2017 covid vaccine d/w pt.   Colonoscopy 2013  PSA wnl Living will d/w pt. Would have his wife designated if pt were incapacitated. No falls.  No hearing deficits noted by patient.   Mood is stable.   Still working.  He has some occ lapses of memory but is still working.  He isn't making mistakes at work.    Knows year, month, day.  3/3 attention and recall, can do math, can spell WORLD backward

## 2020-04-26 NOTE — Assessment & Plan Note (Signed)
Living will d/w pt. Would have his wife designated if pt were incapacitated.

## 2020-04-26 NOTE — Assessment & Plan Note (Signed)
B12 level wnl.  Compliant.  No adverse effect on replacement.  Continue as is.  He agrees.

## 2020-04-26 NOTE — Assessment & Plan Note (Signed)
No recent phlebotomy.  He is managing with diet.  Ferritin controlled. No vomiting, no blood in stool.   Continue as is.  Labs discussed with patient.

## 2020-04-26 NOTE — Assessment & Plan Note (Signed)
No symptoms currently.Has rx for oxycodone to use prn, d/w pt.  No ADE on med prev.  He will update me as needed.

## 2020-04-26 NOTE — Assessment & Plan Note (Signed)
Blood Sugars averaging: 130-200 in the AMs, variable in the AMs.   eye exam within last year: d/w pt.   Labs d/w pt.  A1c controlled.  No change in medications.  I will update endocrinology as FYI.

## 2020-05-03 ENCOUNTER — Ambulatory Visit (INDEPENDENT_AMBULATORY_CARE_PROVIDER_SITE_OTHER): Payer: BC Managed Care – PPO

## 2020-05-03 DIAGNOSIS — E538 Deficiency of other specified B group vitamins: Secondary | ICD-10-CM | POA: Diagnosis not present

## 2020-05-03 MED ORDER — CYANOCOBALAMIN 1000 MCG/ML IJ SOLN
1000.0000 ug | Freq: Once | INTRAMUSCULAR | Status: AC
  Administered 2020-05-03: 1000 ug via INTRAMUSCULAR

## 2020-05-03 NOTE — Progress Notes (Signed)
Per orders of Dr. Duncan, injection of B12 given by Shannon  Branham. Patient tolerated injection well.  

## 2020-06-07 ENCOUNTER — Other Ambulatory Visit: Payer: Self-pay

## 2020-06-07 ENCOUNTER — Ambulatory Visit (INDEPENDENT_AMBULATORY_CARE_PROVIDER_SITE_OTHER): Payer: BC Managed Care – PPO

## 2020-06-07 DIAGNOSIS — E538 Deficiency of other specified B group vitamins: Secondary | ICD-10-CM

## 2020-06-07 MED ORDER — CYANOCOBALAMIN 1000 MCG/ML IJ SOLN
1000.0000 ug | Freq: Once | INTRAMUSCULAR | Status: AC
  Administered 2020-06-07: 1000 ug via INTRAMUSCULAR

## 2020-06-07 NOTE — Progress Notes (Signed)
Per orders of Dr. Damita Dunnings, injection of B12 given by Randall An. Pt requested injection in R deltoid even though he received last B12 in R deltoid. He recently received covid vaccine in the L deltoid. Vaccines documented Patient tolerated injection well.

## 2020-06-09 ENCOUNTER — Other Ambulatory Visit: Payer: Self-pay | Admitting: Family Medicine

## 2020-06-09 ENCOUNTER — Other Ambulatory Visit: Payer: Self-pay | Admitting: Endocrinology

## 2020-06-13 ENCOUNTER — Telehealth: Payer: Self-pay | Admitting: Family Medicine

## 2020-06-13 DIAGNOSIS — K862 Cyst of pancreas: Secondary | ICD-10-CM

## 2020-06-13 NOTE — Telephone Encounter (Signed)
Due for follow-up MRI of pancreatic cyst.  I put in the order.  Please contact patient.  Thanks.

## 2020-06-15 NOTE — Telephone Encounter (Signed)
Patient scheduled for 07/10/20 at [facility]

## 2020-07-06 ENCOUNTER — Other Ambulatory Visit: Payer: Self-pay | Admitting: Endocrinology

## 2020-07-06 DIAGNOSIS — E10649 Type 1 diabetes mellitus with hypoglycemia without coma: Secondary | ICD-10-CM

## 2020-07-10 ENCOUNTER — Ambulatory Visit
Admission: RE | Admit: 2020-07-10 | Discharge: 2020-07-10 | Disposition: A | Discharge status: Home / Self Care | Payer: BC Managed Care – PPO | Source: Ambulatory Visit | Attending: Family Medicine | Admitting: Family Medicine

## 2020-07-10 ENCOUNTER — Other Ambulatory Visit: Payer: Self-pay

## 2020-07-10 DIAGNOSIS — K862 Cyst of pancreas: Secondary | ICD-10-CM

## 2020-07-10 DIAGNOSIS — K449 Diaphragmatic hernia without obstruction or gangrene: Secondary | ICD-10-CM | POA: Diagnosis not present

## 2020-07-10 DIAGNOSIS — K7689 Other specified diseases of liver: Secondary | ICD-10-CM | POA: Diagnosis not present

## 2020-07-10 DIAGNOSIS — K8689 Other specified diseases of pancreas: Secondary | ICD-10-CM | POA: Diagnosis not present

## 2020-07-10 MED ORDER — GADOBENATE DIMEGLUMINE 529 MG/ML IV SOLN
20.0000 mL | Freq: Once | INTRAVENOUS | Status: AC | PRN
  Administered 2020-07-10: 20 mL via INTRAVENOUS

## 2020-07-11 ENCOUNTER — Other Ambulatory Visit: Payer: Self-pay | Admitting: Endocrinology

## 2020-07-12 ENCOUNTER — Other Ambulatory Visit: Payer: Self-pay

## 2020-07-12 ENCOUNTER — Ambulatory Visit (INDEPENDENT_AMBULATORY_CARE_PROVIDER_SITE_OTHER): Payer: BC Managed Care – PPO

## 2020-07-12 DIAGNOSIS — E538 Deficiency of other specified B group vitamins: Secondary | ICD-10-CM | POA: Diagnosis not present

## 2020-07-12 MED ORDER — CYANOCOBALAMIN 1000 MCG/ML IJ SOLN
1000.0000 ug | Freq: Once | INTRAMUSCULAR | Status: AC
  Administered 2020-07-12: 1000 ug via INTRAMUSCULAR

## 2020-07-12 NOTE — Progress Notes (Signed)
Per orders of Dr. Duncan, injection of B12 given by Haizlee Henton. Patient tolerated injection well.  

## 2020-07-25 ENCOUNTER — Ambulatory Visit: Payer: BC Managed Care – PPO | Admitting: Endocrinology

## 2020-07-25 ENCOUNTER — Encounter: Payer: Self-pay | Admitting: Endocrinology

## 2020-07-25 ENCOUNTER — Other Ambulatory Visit: Payer: Self-pay

## 2020-07-25 VITALS — BP 102/64 | HR 64 | Ht 66.5 in | Wt 169.6 lb

## 2020-07-25 DIAGNOSIS — E10649 Type 1 diabetes mellitus with hypoglycemia without coma: Secondary | ICD-10-CM | POA: Diagnosis not present

## 2020-07-25 LAB — POCT GLYCOSYLATED HEMOGLOBIN (HGB A1C): Hemoglobin A1C: 6.7 % — AB (ref 4.0–5.6)

## 2020-07-25 MED ORDER — INSULIN LISPRO (1 UNIT DIAL) 100 UNIT/ML (KWIKPEN): 11.0000 [IU] | PEN_INJECTOR | Freq: Three times a day (TID) | SUBCUTANEOUS | 3 refills | Status: DC

## 2020-07-25 NOTE — Progress Notes (Signed)
Subjective:    Patient ID: Ryan Crane, male    DOB: [birth year], 64 y.o.   MRN: [id number]  HPI Pt returns for f/u of diabetes mellitus:  DM type: 1 Dx'ed: 0240 Complications: CRI Therapy: insulin since 2019, and januvia.  DKA: never.  Severe hypoglycemia: 1 possible episode 2020 Pancreatitis: never Pancreatic imaging: no mention is made on 2018 abd [place].  Other: he takes multiple daily injections; he declines pump and continuous glucose monitor for now.   Interval history:  He takes insulin as rx'ed.  He brings a record of his cbg's which I have reviewed today.  cbg varies from 57-268.  He has mild hypoglycemia approx once per month.  This happens after supper.  Past Medical History:  Diagnosis Date  . Allergy   . Arthritis   . Cholelithiasis    symptomatic  . Diabetes mellitus ([place])   . Family history of malignant neoplasm of gastrointestinal tract   . Fatty liver   . GERD (gastroesophageal reflux disease)   . Graves disease    s/p ablation  . Hemochromatosis   . History of chicken pox   . History of hiatal hernia   . History of kidney stones   . Hyperlipidemia   . Hypertension   . Internal hemorrhoids   . Kidney stones    . Personal history of colonic polyps 03/31/2008   tubular adenoma  . Right fibular fracture 2013    Past Surgical History:  Procedure Laterality Date  . CHOLECYSTECTOMY N/A 05/29/2017   Procedure: LAPAROSCOPIC CHOLECYSTECTOMY;  Surgeon: Clovis Riley, MD;  Location: [facility];  Service: General;  Laterality: N/A;  . COLONOSCOPY W/ BIOPSIES AND POLYPECTOMY    . LITHOTRIPSY    . URETERAL STENT PLACEMENT     left    Social History   Socioeconomic History  . Marital status: Married    Spouse name: Not on file  . Number of children: 1  . Years of education: Not on file  . Highest education level: Not on file  Occupational History  . 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 Smoking status: Never Smoker  .  Smokeless tobacco: Never Used  Vaping Use  . Vaping Use: Never used  Substance and Sexual Activity  . Alcohol use: No    Alcohol/week: 0.0 standard drinks  . Drug use: No  . Sexual activity: Yes  Other Topics Concern  . 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 Difficulty of Paying Living Expenses:   Food Insecurity:   . Worried About Charity fundraiser in the Last Year:   . Arboriculturist in the Last Year:   Transportation Needs:   . Film/video editor (Medical):   Marland Kitchen Lack of Transportation (Non-Medical):   Physical Activity:   . Days of Exercise per Week:   . Minutes of Exercise per Session:   Stress:   . Feeling of Stress :   Social Connections:   . Frequency of Communication with Friends and Family:   . Frequency of Social Gatherings with Friends and Family:   . Attends Religious Services:   . Active Member of Clubs or Organizations:   . Attends Archivist Meetings:   Marland Kitchen Marital Status:  Intimate Partner Violence:   . Fear of Current or Ex-Partner:   . Emotionally Abused:   Marland Kitchen Physically Abused:   . Sexually Abused:     Current Outpatient Medications on File Prior to Visit  Medication Sig Dispense Refill  . Accu-Chek Softclix Lancets lancets 1 each by Other route 4 (four) times daily. E11.9    . albuterol (VENTOLIN HFA) 108 (90 Base) MCG/ACT inhaler INHALE 1-2 PUFFS INTO THE LUNGS EVERY 6 (SIX) HOURS AS NEEDED FOR WHEEZING OR SHORTNESS OF BREATH. 18 g 3  . aspirin 81 MG tablet Take 81 mg by mouth daily.      . Blood Glucose Monitoring Suppl (ACCU-CHEK GUIDE ME) w/Device KIT 1 each by Does not apply route 4 (four) times daily. E11.9 1 kit 0  . cyanocobalamin (,VITAMIN B-12,) 1000 MCG/ML injection Inject 1,000 mcg into the muscle every 30 (thirty) days.    . diclofenac sodium (VOLTAREN) 1 % GEL Apply 2 g topically 4  (four) times daily as needed. 100 g 12  . fenofibrate 160 MG tablet TAKE 1 TABLET BY MOUTH EVERY DAY 30 tablet 11  . fluticasone (FLONASE) 50 MCG/ACT nasal spray Place 1 spray into both nostrils daily.    Marland Kitchen glucose blood (ACCU-CHEK GUIDE) test strip 1 each by Other route 4 (four) times daily. E11.9 360 each 0  . Insulin Glargine (LANTUS SOLOSTAR) 100 UNIT/ML Solostar Pen Inject 22 Units into the skin daily. 5 pen PRN  . Insulin Pen Needle (BD PEN NEEDLE NANO 2ND GEN) 32G X 4 MM MISC 1 each by Other route daily. E11.9 (Patient taking differently: 1 each by Other route 3 (three) times daily. E11.9) 100 each 0  . Insulin Syringe-Needle U-100 30G X 1/2" 0.5 ML MISC 1 each by Does not apply route daily. E11.9 90 each 0  . lisinopril (ZESTRIL) 20 MG tablet TAKE 1 TABLET BY MOUTH EVERY DAY 30 tablet 5  . omeprazole (PRILOSEC) 40 MG capsule TAKE 1 CAPSULE BY MOUTH EVERY DAY 30 capsule 3  . oxyCODONE-acetaminophen (PERCOCET/ROXICET) 5-325 MG tablet Take 1-2 tablets by mouth every 6 (six) hours as needed (for kidney stones). 10 tablet 0  . SYNTHROID 150 MCG tablet TAKE 1 TABLET (150 MCG TOTAL) BY MOUTH DAILY BEFORE BREAKFAST. 30 tablet 11   No current facility-administered medications on file prior to visit.    Allergies  Allergen Reactions  . Flomax [Tamsulosin Hcl]     nausea    Family History  Problem Relation Age of Onset  . COPD Mother   . Arthritis Mother   . Diabetes Mother   . Hypertension Mother   . Lung cancer Father   . Colon cancer Maternal Grandmother 75  . Prostate cancer Neg Hx   . Esophageal cancer Neg Hx   . Rectal cancer Neg Hx   . Stomach cancer Neg Hx   . Thyroid disease Neg Hx     BP 102/64   Pulse 64   Ht 5' 6.5" (1.689 m)   Wt 169 lb 9.6 oz (76.9 kg)   SpO2 96%   BMI 26.96 kg/m     Review of Systems Denies LOC.     Objective:   Physical Exam VITAL SIGNS:  See vs page GENERAL: no distress Pulses: dorsalis pedis intact bilat.   MSK: no deformity of the  feet CV: no leg edema Skin:  no ulcer on the feet.  normal color and temp on the feet. Neuro: sensation is intact to touch on the feet  Lab Results  Component Value Date   HGBA1C 6.7 (A) 07/25/2020       Assessment & Plan:  Type 1 DM Hypoglycemia, due to insulin: this limits aggressiveness of glycemic control  Please continue the same lantus.   Reduce humalog.

## 2020-08-03 ENCOUNTER — Other Ambulatory Visit: Payer: Self-pay | Admitting: Family Medicine

## 2020-08-03 ENCOUNTER — Other Ambulatory Visit: Payer: Self-pay | Admitting: Endocrinology

## 2020-08-08 DIAGNOSIS — E119 Type 2 diabetes mellitus without complications: Secondary | ICD-10-CM | POA: Diagnosis not present

## 2020-08-08 LAB — HM DIABETES EYE EXAM

## 2020-08-16 ENCOUNTER — Ambulatory Visit (INDEPENDENT_AMBULATORY_CARE_PROVIDER_SITE_OTHER): Payer: BC Managed Care – PPO

## 2020-08-16 ENCOUNTER — Other Ambulatory Visit: Payer: Self-pay

## 2020-08-16 DIAGNOSIS — E538 Deficiency of other specified B group vitamins: Secondary | ICD-10-CM | POA: Diagnosis not present

## 2020-08-16 MED ORDER — CYANOCOBALAMIN 1000 MCG/ML IJ SOLN
1000.0000 ug | Freq: Once | INTRAMUSCULAR | Status: AC
  Administered 2020-08-16: 1000 ug via INTRAMUSCULAR

## 2020-08-16 NOTE — Progress Notes (Signed)
Per orders of Dr. Damita Dunnings, injection of B12 given by Randall An. Patient tolerated injection well.

## 2020-08-29 ENCOUNTER — Encounter: Payer: Self-pay | Admitting: Family Medicine

## 2020-09-14 ENCOUNTER — Other Ambulatory Visit: Payer: Self-pay

## 2020-09-14 MED ORDER — ONETOUCH VERIO VI STRP
ORAL_STRIP | 12 refills | Status: DC
Start: 2020-09-14 — End: 2020-11-14

## 2020-09-20 ENCOUNTER — Other Ambulatory Visit: Payer: Self-pay

## 2020-09-20 ENCOUNTER — Ambulatory Visit (INDEPENDENT_AMBULATORY_CARE_PROVIDER_SITE_OTHER): Payer: BC Managed Care – PPO

## 2020-09-20 DIAGNOSIS — E538 Deficiency of other specified B group vitamins: Secondary | ICD-10-CM

## 2020-09-20 MED ORDER — CYANOCOBALAMIN 1000 MCG/ML IJ SOLN
1000.0000 ug | Freq: Once | INTRAMUSCULAR | Status: AC
  Administered 2020-09-20: 1000 ug via INTRAMUSCULAR

## 2020-09-20 NOTE — Progress Notes (Signed)
Per orders of Dr. Danise Mina in absence of Dr Damita Dunnings, injection of vitamin b12 given by Kem Parkinson. Per pt's request, injection given in right arm. Patient tolerated injection well.

## 2020-10-20 ENCOUNTER — Other Ambulatory Visit: Payer: Self-pay | Admitting: Family Medicine

## 2020-10-20 NOTE — Telephone Encounter (Signed)
Pharmacy requests refill on: Fenofibrate 160 mg   LAST REFILL: 01/12/2020 LAST OV: 04/18/2020 NEXT OV: 04/24/2021 PHARMACY: [facility] [place], [place]

## 2020-10-25 ENCOUNTER — Other Ambulatory Visit: Payer: Self-pay

## 2020-10-25 ENCOUNTER — Ambulatory Visit (INDEPENDENT_AMBULATORY_CARE_PROVIDER_SITE_OTHER): Payer: BC Managed Care – PPO

## 2020-10-25 DIAGNOSIS — E538 Deficiency of other specified B group vitamins: Secondary | ICD-10-CM

## 2020-10-25 MED ORDER — CYANOCOBALAMIN 1000 MCG/ML IJ SOLN
1000.0000 ug | Freq: Once | INTRAMUSCULAR | Status: AC
  Administered 2020-10-25: 1000 ug via INTRAMUSCULAR

## 2020-10-25 NOTE — Progress Notes (Signed)
Per orders of Dr. Damita Dunnings, injection of B12 in the Left Deltoid given by Randall An. Patient tolerated injection well.

## 2020-11-05 ENCOUNTER — Other Ambulatory Visit: Payer: Self-pay | Admitting: Endocrinology

## 2020-11-13 ENCOUNTER — Other Ambulatory Visit: Payer: Self-pay | Admitting: Endocrinology

## 2020-11-17 NOTE — Progress Notes (Signed)
prior authorization submitted via covermymeds.  Waiting response. Key: [id number] help? Call us at [phone] Status Additional Information Required Drug Basaglar KwikPen 100UNIT/ML pen-injectors Form [employer] Form (CB)

## 2020-11-21 ENCOUNTER — Other Ambulatory Visit: Payer: Self-pay

## 2020-11-21 ENCOUNTER — Encounter: Payer: Self-pay | Admitting: Endocrinology

## 2020-11-21 ENCOUNTER — Ambulatory Visit: Payer: BC Managed Care – PPO | Admitting: Endocrinology

## 2020-11-21 VITALS — BP 122/68 | HR 54 | Ht 67.0 in | Wt 167.0 lb

## 2020-11-21 DIAGNOSIS — N189 Chronic kidney disease, unspecified: Secondary | ICD-10-CM | POA: Diagnosis not present

## 2020-11-21 DIAGNOSIS — E10622 Type 1 diabetes mellitus with other skin ulcer: Secondary | ICD-10-CM | POA: Diagnosis not present

## 2020-11-21 DIAGNOSIS — E10649 Type 1 diabetes mellitus with hypoglycemia without coma: Secondary | ICD-10-CM

## 2020-11-21 LAB — POCT GLYCOSYLATED HEMOGLOBIN (HGB A1C): Hemoglobin A1C: 7.1 % — AB (ref 4.0–5.6)

## 2020-11-21 MED ORDER — INSULIN LISPRO (1 UNIT DIAL) 100 UNIT/ML (KWIKPEN)
PEN_INJECTOR | SUBCUTANEOUS | 3 refills | Status: DC
Start: 2020-11-21 — End: 2021-02-27

## 2020-11-21 MED ORDER — BASAGLAR KWIKPEN 100 UNIT/ML ~~LOC~~ SOPN
PEN_INJECTOR | SUBCUTANEOUS | 2 refills | Status: DC
Start: 2020-11-21 — End: 2020-12-22

## 2020-11-21 NOTE — Patient Instructions (Addendum)
check your blood sugar 4 times a day: before the 3 meals, and at bedtime.  also check if you have symptoms of your blood sugar being too high or too low.  please keep a record of the readings and bring it to your next appointment here (or you can bring the meter itself).  You can write it on any piece of paper.  please call us sooner if your blood sugar goes below 70, or if you have a lot of readings over 200.  Please change the humalog to the numbers listed below Please come back for a follow-up appointment in 3-4 months.

## 2020-11-21 NOTE — Progress Notes (Signed)
Subjective:    Patient ID: Ryan Crane, male    DOB: 1956-11-12, 64 y.o.   MRN: [id number]  HPI Pt returns for f/u of diabetes mellitus:  DM type: 1 Dx'ed: 0086 Complications: CRI Therapy: insulin since 2019, and januvia.  DKA: never.  Severe hypoglycemia: 1 possible episode 2020 Pancreatitis: never Pancreatic imaging: no mention is made on 2018 abd [place].  Other: he takes multiple daily injections; he declines pump and continuous glucose monitor for now.   Interval history:  He takes insulin as rx'ed.  He brings a record of his cbg's which I have reviewed today.  cbg varies from 52-299.  It is in general highest at HS.  At breakfast and lunch, it is lower pc than ac. This happens after supper.   Past Medical History:  Diagnosis Date  . Allergy   . Arthritis   . Cholelithiasis    symptomatic  . Diabetes mellitus ([place])   . Family history of malignant neoplasm of gastrointestinal tract   . Fatty liver   . GERD (gastroesophageal reflux disease)   . Graves disease    s/p ablation  . Hemochromatosis   . History of chicken pox   . History of hiatal hernia   . History of kidney stones   . Hyperlipidemia   . Hypertension   . Internal hemorrhoids   . Kidney stones    . Personal history of colonic polyps 03/31/2008   tubular adenoma  . Right fibular fracture 2013    Past Surgical History:  Procedure Laterality Date  . CHOLECYSTECTOMY N/A 05/29/2017   Procedure: LAPAROSCOPIC CHOLECYSTECTOMY;  Surgeon: Clovis Riley, MD;  Location: [facility];  Service: General;  Laterality: N/A;  . COLONOSCOPY W/ BIOPSIES AND POLYPECTOMY    . LITHOTRIPSY    . URETERAL STENT PLACEMENT     left    Social History   Socioeconomic History  . Marital status: Married    Spouse name: Not on file  . Number of children: 1  . Years of education: Not on file  . Highest education level: Not on file  Occupational History  . 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 Smoking  status: Never Smoker  . Smokeless tobacco: Never Used  Vaping Use  . Vaping Use: Never used  Substance and Sexual Activity  . Alcohol use: No    Alcohol/week: 0.0 standard drinks  . Drug use: No  . Sexual activity: Yes  Other Topics Concern  . 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   Married 1977   1 daughter   Social Determinants of Health   Financial Resource Strain: Not on file  Food Insecurity: Not on file  Transportation Needs: Not on file  Physical Activity: Not on file  Stress: Not on file  Social Connections: Not on file  Intimate Partner Violence: Not on file    Current Outpatient Medications on File Prior to Visit  Medication Sig Dispense Refill  . Accu-Chek Softclix Lancets lancets 1 each by Other route 4 (four) times daily. E11.9    . albuterol (VENTOLIN HFA) 108 (90 Base) MCG/ACT inhaler INHALE 1-2 PUFFS INTO THE LUNGS EVERY 6 (SIX) HOURS AS NEEDED FOR WHEEZING OR SHORTNESS OF BREATH. 18 g 3  . aspirin 81 MG tablet Take 81 mg by mouth daily.    . BD PEN NEEDLE NANO 2ND GEN 32G X 4  MM MISC 1 EACH BY OTHER ROUTE 4 (FOUR) TIMES DAILY. 400 each 0  . Blood Glucose Monitoring Suppl (ACCU-CHEK GUIDE ME) w/Device KIT 1 each by Does not apply route 4 (four) times daily. E11.9 1 kit 0  . cyanocobalamin (,VITAMIN B-12,) 1000 MCG/ML injection Inject 1,000 mcg into the muscle every 30 (thirty) days.    . diclofenac sodium (VOLTAREN) 1 % GEL Apply 2 g topically 4 (four) times daily as needed. 100 g 12  . fenofibrate 160 MG tablet TAKE 1 TABLET BY MOUTH EVERY DAY 90 tablet 1  . glucose blood (ACCU-CHEK GUIDE) test strip 1 each by Other route 4 (four) times daily. E11.9 360 each 0  . glucose blood (ONETOUCH VERIO) test strip USE TO TEST BLOOD SUGARS AS DIRECTED 7 TIMES A DAY. 100 strip 11  . Insulin Syringe-Needle U-100 30G X 1/2" 0.5 ML MISC 1 each by Does not apply route daily. E11.9 90 each 0  . lisinopril (ZESTRIL) 20 MG tablet TAKE  1 TABLET BY MOUTH EVERY DAY 90 tablet 1  . omeprazole (PRILOSEC) 40 MG capsule TAKE 1 CAPSULE BY MOUTH EVERY DAY 90 capsule 1  . oxyCODONE-acetaminophen (PERCOCET/ROXICET) 5-325 MG tablet Take 1-2 tablets by mouth every 6 (six) hours as needed (for kidney stones). 10 tablet 0  . SYNTHROID 150 MCG tablet TAKE 1 TABLET (150 MCG TOTAL) BY MOUTH DAILY BEFORE BREAKFAST. 90 tablet 3  . fluticasone (FLONASE) 50 MCG/ACT nasal spray Place 1 spray into both nostrils daily.     No current facility-administered medications on file prior to visit.    Allergies  Allergen Reactions  . Flomax [Tamsulosin Hcl]     nausea    Family History  Problem Relation Age of Onset  . COPD Mother   . Arthritis Mother   . Diabetes Mother   . Hypertension Mother   . Lung cancer Father   . Colon cancer Maternal Grandmother 79  . Prostate cancer Neg Hx   . Esophageal cancer Neg Hx   . Rectal cancer Neg Hx   . Stomach cancer Neg Hx   . Thyroid disease Neg Hx     BP 122/68   Pulse (!) 54   Ht 5' 7" (1.702 m)   Wt 167 lb (75.8 kg)   SpO2 98%   BMI 26.16 kg/m    Review of Systems     Objective:   Physical Exam VITAL SIGNS:  See vs page GENERAL: no distress Pulses: dorsalis pedis intact bilat.   MSK: no deformity of the feet CV: no leg edema.   Skin:  no ulcer on the feet.  normal color and temp on the feet. Neuro: sensation is intact to touch on the feet.    Lab Results  Component Value Date   HGBA1C 7.1 (A) 11/21/2020        Assessment & Plan:  Type 1 DM, with CRI Hypoglycemia, due to insulin: this limits aggressiveness of glycemic control   Patient Instructions  check your blood sugar 4 times a day: before the 3 meals, and at bedtime.  also check if you have symptoms of your blood sugar being too high or too low.  please keep a record of the readings and bring it to your next appointment here (or you can bring the meter itself).  You can write it on any piece of paper.  please call us  sooner if your blood sugar goes below 70, or if you have a lot of readings over 200.  Please change the humalog to the numbers listed below Please come back for a follow-up appointment in 3-4 months.

## 2020-11-29 ENCOUNTER — Ambulatory Visit (INDEPENDENT_AMBULATORY_CARE_PROVIDER_SITE_OTHER): Payer: BC Managed Care – PPO

## 2020-11-29 ENCOUNTER — Other Ambulatory Visit: Payer: Self-pay

## 2020-11-29 DIAGNOSIS — E538 Deficiency of other specified B group vitamins: Secondary | ICD-10-CM | POA: Diagnosis not present

## 2020-11-29 MED ORDER — CYANOCOBALAMIN 1000 MCG/ML IJ SOLN
1000.0000 ug | Freq: Once | INTRAMUSCULAR | Status: AC
  Administered 2020-11-29: 08:00:00 1000 ug via INTRAMUSCULAR

## 2020-11-29 NOTE — Progress Notes (Signed)
Per orders of Dr. Damita Dunnings, injection of B12 in R deltoid given by Randall An. Patient tolerated injection well.

## 2020-12-19 ENCOUNTER — Other Ambulatory Visit: Payer: Self-pay | Admitting: Family

## 2020-12-19 ENCOUNTER — Telehealth (INDEPENDENT_AMBULATORY_CARE_PROVIDER_SITE_OTHER): Payer: Medicare Other | Admitting: Family

## 2020-12-19 ENCOUNTER — Other Ambulatory Visit: Payer: Self-pay

## 2020-12-19 ENCOUNTER — Telehealth: Payer: Self-pay

## 2020-12-19 DIAGNOSIS — J069 Acute upper respiratory infection, unspecified: Secondary | ICD-10-CM | POA: Diagnosis not present

## 2020-12-19 NOTE — Telephone Encounter (Signed)
[name] - Client TELEPHONE ADVICE RECORD [facility] Patient Name: Ryan Crane VALLEY Gender: Male DOB: Aug 28, 1956 Age: 65 Y 3 M 5 D Return Phone Number: [phone] (Primary), [phone] (Secondary) Address[place] [address] Client [facility] - Client Client Site [facility] Physician Renford Dills - MD Contact Type Call Who Is Calling Patient / Member / Family / Caregiver Call Type Triage / Clinical Relationship To Patient Self Return Phone Number [phone] (Primary) Chief Complaint Cough Reason for Call Symptomatic / Request for Health Information Initial Comment Has cough, sinus drainage in lungs. Wants to get a covid test. Translation No Nurse Assessment Nurse: Acey Lav, RN, Estill Bamberg Date/Time (Eastern Time): 12/17/2020 10:21:39 AM Confirm and document reason for call. If symptomatic, describe symptoms. ---Caller states he has a cough, sinus drainage. Wants to get a covid test. Cough started Thursday, off and on all day yesterday. Using inhaler PCP gave him before and Flonase, helps a bit. Some mucus with cough: clear in color. Runny nose chronic issue per caller: white discharge for mucus: and some yellow in color too. no fever. Does the patient have any new or worsening symptoms? ---Yes Will a triage be completed? ---Yes Related visit to physician within the last 2 weeks? ---No Does the PT have any chronic conditions? (i.e. diabetes, asthma, this includes High risk factors for pregnancy, etc.) ---Yes List chronic conditions. ---Diabetic, hemochromatosis Is this a behavioral health or substance abuse call? ---No Guidelines Guideline Title Affirmed Question Affirmed Notes Nurse Date/Time (Eastern Time) COVID-19 - Diagnosed or Suspected HIGH RISK for severe COVID complications (e.g., age > 27 years, obesity with BMI > 25, pregnant, chronic lung disease or  other chronic medical condition) (Exception: Already seen by PCP and no new or worsening symptoms.) Acey Lav, RN, Estill Bamberg 12/17/2020 10:24:39 AM PLEASE NOTE: All timestamps contained within this report are represented as [place] Standard Time. CONFIDENTIALTY NOTICE: This fax transmission is intended only for the addressee. It contains information that is legally privileged, confidential or otherwise protected from use or disclosure. If you are not the intended recipient, you are strictly prohibited from reviewing, disclosing, copying using or disseminating any of this information or taking any action in reliance on or regarding this information. If you have received this fax in error, please notify us immediately by telephone so that we can arrange for its return to [place]. Phone: [phone], Toll-Free: [phone], Fax: [phone] Page: 2 of 2 Call Id: [id number] [place]. Time Eilene Ghazi Time) Disposition Final User 12/17/2020 10:28:54 AM Go to ED Now (or PCP triage) Yes Acey Lav, RN, Estill Bamberg Disposition Overriden: Call PCP Now Override Reason: Specify reason. (Please document in 'advice recommended' section) Caller Disagree/Comply Comply Caller Understands Yes PreDisposition Did not know what to do Care Advice Given Per Guideline GO TO ED NOW (OR PCP TRIAGE): * IF NO PCP (PRIMARY CARE PROVIDER) SECOND-LEVEL TRIAGE: You need to be seen within the next hour. Go to the [facility] at _____________ [place] as soon as you can. CARE ADVICE given per COVID-19 - DIAGNOSED OR SUSPECTED (Adult) guideline. * Telemedicine may be your best choice for care during this COVID-19 outbreak. ALTERNATE DISPOSITION - CALL TELEMEDICINE DOCTOR NOW: * You should call a telemedicine doctor (or NP/PA) now, if your own doctor is not available. * Cough: Use cough drops. GENERAL CARE ADVICE FOR COVID-19 SYMPTOMS: * Feeling dehydrated: Drink extra liquids. If the air in your home is dry, use a humidifier. Referrals [facility]

## 2020-12-19 NOTE — Telephone Encounter (Signed)
Pt has VV with another [facility] clinic today.

## 2020-12-19 NOTE — Telephone Encounter (Signed)
Noted. Thanks.

## 2020-12-19 NOTE — Progress Notes (Signed)
Ryan Crane is a 65 y.o. male with the following history as recorded in EpicCare:  Patient Active Problem List   Diagnosis Date Noted  . Right shoulder pain 09/06/2019  . Paresthesia 05/10/2019  . Type 1 diabetes mellitus with hypoglycemia ([place]) 02/10/2019  . Pain of finger 11/05/2018  . Pancreas cyst 05/09/2018  . RUQ pain 04/03/2017  . Nasal obstruction 09/24/2016  . Advance care planning 03/22/2015  . B12 deficiency 03/18/2014  . Diabetes mellitus without complication ([place]) 16/09/9603  . Pain in right elbow 02/23/2013  . Duodenitis 02/13/2012  . Gastric polyp 02/13/2012  . Hemochromatosis 01/27/2012  . Routine general medical examination at a health care facility 01/27/2012  . GERD (gastroesophageal reflux disease) 01/14/2012  . Personal history of colonic polyps 01/14/2012  . Hiatal hernia 12/16/2011  . Nephrolithiasis 12/16/2011  . Hyperlipidemia 11/05/2011  . Graves' disease 11/05/2011  . Arthritis 01/23/2011  . Essential hypertension 08/22/2007    Current Outpatient Medications  Medication Sig Dispense Refill  . Accu-Chek Softclix Lancets lancets 1 each by Other route 4 (four) times daily. E11.9    . albuterol (VENTOLIN HFA) 108 (90 Base) MCG/ACT inhaler INHALE 1-2 PUFFS INTO THE LUNGS EVERY 6 (SIX) HOURS AS NEEDED FOR WHEEZING OR SHORTNESS OF BREATH. 18 g 3  . aspirin 81 MG tablet Take 81 mg by mouth daily.    . BD PEN NEEDLE NANO 2ND GEN 32G X 4 MM MISC 1 EACH BY OTHER ROUTE 4 (FOUR) TIMES DAILY. 400 each 0  . Blood Glucose Monitoring Suppl (ACCU-CHEK GUIDE ME) w/Device KIT 1 each by Does not apply route 4 (four) times daily. E11.9 1 kit 0  . cyanocobalamin (,VITAMIN B-12,) 1000 MCG/ML injection Inject 1,000 mcg into the muscle every 30 (thirty) days.    . diclofenac sodium (VOLTAREN) 1 % GEL Apply 2 g topically 4 (four) times daily as needed. 100 g 12  . fenofibrate 160 MG tablet TAKE 1 TABLET BY MOUTH EVERY DAY 90 tablet 1  . fluticasone (FLONASE) 50 MCG/ACT  nasal spray Place 1 spray into both nostrils daily.    Marland Kitchen glucose blood (ACCU-CHEK GUIDE) test strip 1 each by Other route 4 (four) times daily. E11.9 360 each 0  . glucose blood (ONETOUCH VERIO) test strip USE TO TEST BLOOD SUGARS AS DIRECTED 7 TIMES A DAY. 100 strip 11  . Insulin Glargine (BASAGLAR KWIKPEN) 100 UNIT/ML 26 UNITS INTO THE SKIN AT BEDTIME. 30 mL 2  . insulin lispro (HUMALOG KWIKPEN) 100 UNIT/ML KwikPen 10-12 units with breakfast and with lunch, and 12-14 units with supper 45 mL 3  . Insulin Syringe-Needle U-100 30G X 1/2" 0.5 ML MISC 1 each by Does not apply route daily. E11.9 90 each 0  . lisinopril (ZESTRIL) 20 MG tablet TAKE 1 TABLET BY MOUTH EVERY DAY 90 tablet 1  . omeprazole (PRILOSEC) 40 MG capsule TAKE 1 CAPSULE BY MOUTH EVERY DAY 90 capsule 1  . oxyCODONE-acetaminophen (PERCOCET/ROXICET) 5-325 MG tablet Take 1-2 tablets by mouth every 6 (six) hours as needed (for kidney stones). 10 tablet 0  . SYNTHROID 150 MCG tablet TAKE 1 TABLET (150 MCG TOTAL) BY MOUTH DAILY BEFORE BREAKFAST. 90 tablet 3   No current facility-administered medications for this visit.    Allergies: Flomax [tamsulosin hcl]  Past Medical History:  Diagnosis Date  . Allergy   . Arthritis   . Cholelithiasis    symptomatic  . Diabetes mellitus ([place])   . Family history of malignant neoplasm of gastrointestinal tract   .  Fatty liver   . GERD (gastroesophageal reflux disease)   . Graves disease    s/p ablation  . Hemochromatosis   . History of chicken pox   . History of hiatal hernia   . History of kidney stones   . Hyperlipidemia   . Hypertension   . Internal hemorrhoids   . Kidney stones    . Personal history of colonic polyps 03/31/2008   tubular adenoma  . Right fibular fracture 2013    Past Surgical History:  Procedure Laterality Date  . CHOLECYSTECTOMY N/A 05/29/2017   Procedure: LAPAROSCOPIC CHOLECYSTECTOMY;  Surgeon: Clovis Riley, MD;  Location: [facility];  Service: General;   Laterality: N/A;  . COLONOSCOPY W/ BIOPSIES AND POLYPECTOMY    . LITHOTRIPSY    . URETERAL STENT PLACEMENT     left    Family History  Problem Relation Age of Onset  . COPD Mother   . Arthritis Mother   . Diabetes Mother   . Hypertension Mother   . Lung cancer Father   . Colon cancer Maternal Grandmother 39  . Prostate cancer Neg Hx   . Esophageal cancer Neg Hx   . Rectal cancer Neg Hx   . Stomach cancer Neg Hx   . Thyroid disease Neg Hx     Social History   Tobacco Use  . Smoking status: Never Smoker  . Smokeless tobacco: Never Used  Substance Use Topics  . Alcohol use: No    Alcohol/week: 0.0 standard drinks    Subjective:   I connected with Ryan Crane on 12/19/20 at  3:00 PM EST by a video enabled telemedicine application and verified that I am speaking with the correct person using two identifiers.   I discussed the limitations of evaluation and management by telemedicine and the availability of in person appointments. The patient expressed understanding and agreed to proceed. Provider in office/ patient is at home; provider and patient are only 2 people on video call.   Experiencing sinus drainage/ coughing since late last week; notes he is actually starting to feel better but was hoping to get a COVID test for reassurance; is diabetic and has noticed his blood sugars have been a little higher than normal; denies any chest pain or shortness of breath or difficulty breathing;      Objective:  There were no vitals filed for this visit.   Lungs: Respirations unlabored;  Neurologic: Alert and oriented; speech intact;   Assessment:  1. Viral URI     Plan:  Will check COVID test per patient request; symptomatic treatment discussed- patients symptoms are improving and OTC treatment options recommended; increase fluids, rest and follow up with his PCP with continued questions/ concerns.  Time spent 8 minutes  No follow-ups on file.  Orders Placed This  Encounter  Procedures  . Novel Coronavirus, NAA ([facility])    Order Specific Question:   Is this test for diagnosis or screening    Answer:   Diagnosis of ill patient    Order Specific Question:   Symptomatic for COVID-19 as defined by CDC    Answer:   Yes    Order Specific Question:   Date of Symptom Onset    Answer:   12/15/2020    Order Specific Question:   Hospitalized for COVID-19    Answer:   No    Order Specific Question:   Admitted to ICU for COVID-19    Answer:   No    Order Specific Question:  Previously tested for COVID-19    Answer:   No    Order Specific Question:   Resident in a congregate (group) care setting    Answer:   No    Order Specific Question:   Is the patient student?    Answer:   No    Order Specific Question:   Employed in healthcare setting    Answer:   No    Order Specific Question:   Has patient completed COVID vaccination(s) (2 doses of Pfizer/Moderna 1 dose of [place])    Answer:   Yes    Requested Prescriptions    No prescriptions requested or ordered in this encounter

## 2020-12-19 NOTE — Progress Notes (Signed)
Pt scheduled for covid test today - leaving his office now.  Arrival instructions given, pt verb understanding.

## 2020-12-20 ENCOUNTER — Telehealth: Payer: Self-pay | Admitting: Endocrinology

## 2020-12-20 ENCOUNTER — Other Ambulatory Visit: Payer: Self-pay | Admitting: *Deleted

## 2020-12-20 DIAGNOSIS — E10649 Type 1 diabetes mellitus with hypoglycemia without coma: Secondary | ICD-10-CM

## 2020-12-20 MED ORDER — BD PEN NEEDLE NANO 2ND GEN 32G X 4 MM MISC: 0 refills | Status: DC

## 2020-12-20 MED ORDER — ONETOUCH VERIO VI STRP: ORAL_STRIP | 11 refills | Status: DC

## 2020-12-20 NOTE — Telephone Encounter (Signed)
Patient came into office to make sure we had the correct insurance information on file for him - and we do, so he said since the [place] is not covered can we please call in Lantus (this is covered by ins) to pharmacy:  [id number] - [place], [place] [place] [address]  Phone:  [phone] Fax:  [phone]

## 2020-12-21 LAB — NOVEL CORONAVIRUS, NAA: SARS-CoV-2, NAA: DETECTED — AB

## 2020-12-21 LAB — SARS-COV-2, NAA 2 DAY TAT

## 2020-12-21 LAB — SPECIMEN STATUS REPORT

## 2020-12-21 NOTE — Telephone Encounter (Signed)
Pt would like to switch to Lantus.   Pt stated that the [place] Liter is not covered by insurance.   Please advise

## 2020-12-22 ENCOUNTER — Other Ambulatory Visit: Payer: Self-pay | Admitting: Endocrinology

## 2020-12-22 ENCOUNTER — Telehealth: Payer: Self-pay | Admitting: Family

## 2020-12-22 ENCOUNTER — Telehealth: Payer: Self-pay

## 2020-12-22 MED ORDER — LANTUS SOLOSTAR 100 UNIT/ML ~~LOC~~ SOPN: 26.0000 [IU] | PEN_INJECTOR | Freq: Every day | SUBCUTANEOUS | 99 refills | Status: DC

## 2020-12-22 NOTE — Telephone Encounter (Signed)
Pt had video visit with covid testing on 12/19/20 with Jodi Mourning FNP. Pt went on my chart and has questions about covid lab test. I spoke with DR Damita Dunnings and he said to triage pt and if not urgent warm transfer pt to [facility]. I spoke with pt and he feels OK; has some sinus drainage, prod cough with yellow phlegm;but no H/A, SOB,S/T or fever  Or other covid symptoms. I did warm transferto [place] at [employer] [facility]. Sending to Dr Damita Dunnings and could not send to  Marrian Salvage FNP.

## 2020-12-22 NOTE — Telephone Encounter (Signed)
Patient is calling and has questions about his lab results. He states he is trying to understand what they say. Informed patient the provider has not yet read the results. Once she does the assistant will give him a call back and go over results with him.  Patient wanted [place] to know he feels fine.He states "Really, I feel great"!

## 2020-12-22 NOTE — Telephone Encounter (Signed)
Ok, I have sent a prescription to your pharmacy, to change

## 2020-12-23 NOTE — Telephone Encounter (Signed)
Agreed.  Thanks.  Will defer to [facility] clinic.

## 2020-12-26 NOTE — Telephone Encounter (Signed)
Pt states she saw Laura's mychart message; is not symptomatic & has no ques/concerns.

## 2020-12-27 ENCOUNTER — Other Ambulatory Visit: Payer: Self-pay | Admitting: Family Medicine

## 2020-12-27 MED ORDER — ALBUTEROL SULFATE HFA 108 (90 BASE) MCG/ACT IN AERS: 2.0000 | INHALATION_SPRAY | Freq: Four times a day (QID) | RESPIRATORY_TRACT | 5 refills | Status: DC | PRN

## 2021-01-03 ENCOUNTER — Ambulatory Visit (INDEPENDENT_AMBULATORY_CARE_PROVIDER_SITE_OTHER): Payer: Medicare Other

## 2021-01-03 DIAGNOSIS — E538 Deficiency of other specified B group vitamins: Secondary | ICD-10-CM

## 2021-01-03 MED ORDER — CYANOCOBALAMIN 1000 MCG/ML IJ SOLN
1000.0000 ug | Freq: Once | INTRAMUSCULAR | Status: AC
  Administered 2021-01-03: 1000 ug via INTRAMUSCULAR

## 2021-01-03 NOTE — Progress Notes (Signed)
Per orders of Damita Dunnings, injection of B12, given by Aneta Mins, RN. Patient tolerated injection well in L Deltoid.

## 2021-01-12 ENCOUNTER — Other Ambulatory Visit: Payer: Self-pay | Admitting: Endocrinology

## 2021-01-24 ENCOUNTER — Other Ambulatory Visit: Payer: Self-pay | Admitting: Family Medicine

## 2021-01-24 ENCOUNTER — Other Ambulatory Visit: Payer: Self-pay | Admitting: Endocrinology

## 2021-01-24 ENCOUNTER — Telehealth: Payer: Self-pay | Admitting: *Deleted

## 2021-01-24 NOTE — Telephone Encounter (Signed)
Generic is always fine with me.

## 2021-01-25 ENCOUNTER — Other Ambulatory Visit: Payer: Self-pay | Admitting: *Deleted

## 2021-01-25 MED ORDER — SYNTHROID 150 MCG PO TABS: ORAL_TABLET | ORAL | 3 refills | Status: DC

## 2021-01-25 NOTE — Telephone Encounter (Signed)
Resent generic to the pharmacy

## 2021-02-03 ENCOUNTER — Other Ambulatory Visit: Payer: Self-pay | Admitting: Family Medicine

## 2021-02-07 ENCOUNTER — Encounter: Payer: Self-pay | Admitting: Endocrinology

## 2021-02-07 ENCOUNTER — Encounter: Payer: Self-pay | Admitting: Family Medicine

## 2021-02-07 ENCOUNTER — Other Ambulatory Visit: Payer: Self-pay

## 2021-02-07 ENCOUNTER — Ambulatory Visit (INDEPENDENT_AMBULATORY_CARE_PROVIDER_SITE_OTHER): Payer: Medicare Other

## 2021-02-07 DIAGNOSIS — E538 Deficiency of other specified B group vitamins: Secondary | ICD-10-CM | POA: Diagnosis not present

## 2021-02-07 MED ORDER — CYANOCOBALAMIN 1000 MCG/ML IJ SOLN
1000.0000 ug | Freq: Once | INTRAMUSCULAR | Status: AC
Start: 2021-02-07 — End: 2021-02-07
  Administered 2021-02-07: 1000 ug via INTRAMUSCULAR

## 2021-02-07 NOTE — Progress Notes (Signed)
Per orders of Duncan, injection of B12 in Right Deltoid, given by Lindalou Hose, CMA. Patient tolerated injection well

## 2021-02-08 ENCOUNTER — Other Ambulatory Visit: Payer: Self-pay | Admitting: Family Medicine

## 2021-02-08 DIAGNOSIS — E119 Type 2 diabetes mellitus without complications: Secondary | ICD-10-CM

## 2021-02-08 DIAGNOSIS — E538 Deficiency of other specified B group vitamins: Secondary | ICD-10-CM

## 2021-02-08 MED ORDER — LEVOTHYROXINE SODIUM 150 MCG PO TABS: 150.0000 ug | ORAL_TABLET | Freq: Every day | ORAL | 3 refills | Status: DC

## 2021-02-09 ENCOUNTER — Other Ambulatory Visit: Payer: Self-pay | Admitting: Endocrinology

## 2021-02-09 MED ORDER — BASAGLAR KWIKPEN 100 UNIT/ML ~~LOC~~ SOPN: 22.0000 [IU] | PEN_INJECTOR | Freq: Every day | SUBCUTANEOUS | 3 refills | Status: DC

## 2021-02-27 ENCOUNTER — Other Ambulatory Visit: Payer: Self-pay

## 2021-02-27 ENCOUNTER — Ambulatory Visit: Payer: Medicare Other | Admitting: Endocrinology

## 2021-02-27 VITALS — BP 130/80 | HR 66 | Ht 68.0 in | Wt 168.2 lb

## 2021-02-27 DIAGNOSIS — E10649 Type 1 diabetes mellitus with hypoglycemia without coma: Secondary | ICD-10-CM

## 2021-02-27 LAB — POCT GLYCOSYLATED HEMOGLOBIN (HGB A1C): Hemoglobin A1C: 7.1 % — AB (ref 4.0–5.6)

## 2021-02-27 MED ORDER — INSULIN LISPRO (1 UNIT DIAL) 100 UNIT/ML (KWIKPEN): PEN_INJECTOR | SUBCUTANEOUS | 3 refills | Status: DC

## 2021-02-27 NOTE — Progress Notes (Signed)
Subjective:    Patient ID: Ryan Crane, male    DOB: Oct 09, 1956, 65 y.o.   MRN: [id number]  HPI Pt returns for f/u of diabetes mellitus:  DM type: 1 Dx'ed: 2992 Complications: CRI Therapy: insulin since 2019, and januvia.  DKA: never.  Severe hypoglycemia: 1 possible episode 2020 Pancreatitis: never Pancreatic imaging: no mention is made on 2018 abd [place].  Other: he takes multiple daily injections; he declines pump and continuous glucose monitor.   Interval history:  He takes insulin as rx'ed.  He brings a record of his cbg's which I have reviewed today.  cbg varies from 54-392.  It is in general highest at HS.  At breakfast and lunch, it is lower pc than ac. He has mild hypoglycemia appprox once/week.  Past Medical History:  Diagnosis Date  . Allergy   . Arthritis   . Cholelithiasis    symptomatic  . Diabetes mellitus ([place])   . Family history of malignant neoplasm of gastrointestinal tract   . Fatty liver   . GERD (gastroesophageal reflux disease)   . Graves disease    s/p ablation  . Hemochromatosis   . History of chicken pox   . History of hiatal hernia   . History of kidney stones   . Hyperlipidemia   . Hypertension   . Internal hemorrhoids   . Kidney stones    . Personal history of colonic polyps 03/31/2008   tubular adenoma  . Right fibular fracture 2013    Past Surgical History:  Procedure Laterality Date  . CHOLECYSTECTOMY N/A 05/29/2017   Procedure: LAPAROSCOPIC CHOLECYSTECTOMY;  Surgeon: Clovis Riley, MD;  Location: [facility];  Service: General;  Laterality: N/A;  . COLONOSCOPY W/ BIOPSIES AND POLYPECTOMY    . LITHOTRIPSY    . URETERAL STENT PLACEMENT     left    Social History   Socioeconomic History  . Marital status: Married    Spouse name: Not on file  . Number of children: 1  . Years of education: Not on file  . Highest education level: Not on file  Occupational History  . 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  Smoking status: Never Smoker  . Smokeless tobacco: Never Used  Vaping Use  . Vaping Use: Never used  Substance and Sexual Activity  . Alcohol use: No    Alcohol/week: 0.0 standard drinks  . Drug use: No  . Sexual activity: Yes  Other Topics Concern  . 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 Accu-Chek Softclix Lancets lancets 1 each by Other route 4 (four) times daily. E11.9    . albuterol (VENTOLIN HFA) 108 (90 Base) MCG/ACT inhaler Inhale 2 puffs into the lungs every 6 (six) hours as needed for wheezing or shortness of breath. 8 g 5  . aspirin 81 MG tablet Take 81 mg by mouth daily.    . Blood Glucose Monitoring Suppl (ACCU-CHEK GUIDE ME) w/Device KIT 1  each by Does not apply route 4 (four) times daily. E11.9 1 kit 0  . cyanocobalamin (,VITAMIN B-12,) 1000 MCG/ML injection Inject 1,000 mcg into the muscle every 30 (thirty) days.    . diclofenac sodium (VOLTAREN) 1 % GEL Apply 2 g topically 4 (four) times daily as needed. 100 g 12  . fenofibrate 160 MG tablet TAKE 1 TABLET BY MOUTH EVERY DAY 90 tablet 1  . fluticasone (FLONASE) 50 MCG/ACT nasal spray Place 1 spray into both nostrils daily.    Marland Kitchen glucose blood (ACCU-CHEK GUIDE) test strip 1 each by Other route 4 (four) times daily. E11.9 360 each 0  . glucose blood (ONETOUCH VERIO) test strip USE TO TEST BLOOD SUGARS AS DIRECTED 7 TIMES A DAY. 100 strip 11  . Insulin Glargine (BASAGLAR KWIKPEN) 100 UNIT/ML Inject 22 Units into the skin daily. 30 mL 3  . Insulin Pen Needle (BD PEN NEEDLE NANO 2ND GEN) 32G X 4 MM  MISC 1 EACH BY OTHER ROUTE 4 (FOUR) TIMES DAILY. 400 each 0  . Insulin Syringe-Needle U-100 30G X 1/2" 0.5 ML MISC 1 each by Does not apply route daily. E11.9 90 each 0  . levothyroxine (SYNTHROID) 150 MCG tablet Take 1 tablet (150 mcg total) by mouth daily. 90 tablet 3  . lisinopril (ZESTRIL) 20 MG tablet TAKE 1 TABLET BY MOUTH EVERY DAY 90 tablet 1  . omeprazole (PRILOSEC) 40 MG capsule TAKE 1 CAPSULE BY MOUTH EVERY DAY 90 capsule 1  . oxyCODONE-acetaminophen (PERCOCET/ROXICET) 5-325 MG tablet Take 1-2 tablets by mouth every 6 (six) hours as needed (for kidney stones). 10 tablet 0   No current facility-administered medications on file prior to visit.    Allergies  Allergen Reactions  . Flomax [Tamsulosin Hcl]     nausea    Family History  Problem Relation Age of Onset  . COPD Mother   . Arthritis Mother   . Diabetes Mother   . Hypertension Mother   . Lung cancer Father   . Colon cancer Maternal Grandmother 8  . Prostate cancer Neg Hx   . Esophageal cancer Neg Hx   . Rectal cancer Neg Hx   . Stomach cancer Neg Hx   . Thyroid disease Neg Hx     BP 130/80 (BP Location: Right Arm, Patient Position: Sitting, Cuff Size: Normal)   Pulse 66   Ht $R'5\' 8"'Mv$  (1.727 m)   Wt 168 lb 3.2 oz (76.3 kg)   SpO2 96%   BMI 25.57 kg/m    Review of Systems Denies LOC    Objective:   Physical Exam VITAL SIGNS:  See vs page GENERAL: no distress Pulses: dorsalis pedis intact bilat.   MSK: no deformity of the feet CV: no leg edema Skin:  no ulcer on the feet.  normal color and temp on the feet. Neuro: sensation is intact to touch on the feet.     Lab Results  Component Value Date   HGBA1C 7.1 (A) 02/27/2021       Assessment & Plan:  Type 1 DM, with CRI Hypoglycemia, due to insulin: this limits aggressiveness of glycemic control   Patient Instructions  check your blood sugar 4 times a day: before the 3 meals, and at bedtime.  also check if you have symptoms of your blood sugar  being too high or too low.  please keep a record of the readings and bring it to your next appointment here (or you can bring the meter itself).  You can  write it on any piece of paper.  please call us sooner if your blood sugar goes below 70, or if you have a lot of readings over 200.  Please reduce the humalog to the numbers listed below Please come back for a follow-up appointment in 4 months.

## 2021-02-27 NOTE — Patient Instructions (Signed)
check your blood sugar 4 times a day: before the 3 meals, and at bedtime.  also check if you have symptoms of your blood sugar being too high or too low.  please keep a record of the readings and bring it to your next appointment here (or you can bring the meter itself).  You can write it on any piece of paper.  please call us sooner if your blood sugar goes below 70, or if you have a lot of readings over 200.  Please reduce the humalog to the numbers listed below Please come back for a follow-up appointment in 4 months.

## 2021-03-01 ENCOUNTER — Other Ambulatory Visit: Payer: Self-pay | Admitting: Family Medicine

## 2021-03-14 ENCOUNTER — Ambulatory Visit (INDEPENDENT_AMBULATORY_CARE_PROVIDER_SITE_OTHER): Payer: Medicare Other

## 2021-03-14 ENCOUNTER — Other Ambulatory Visit: Payer: Self-pay

## 2021-03-14 DIAGNOSIS — E538 Deficiency of other specified B group vitamins: Secondary | ICD-10-CM

## 2021-03-14 MED ORDER — CYANOCOBALAMIN 1000 MCG/ML IJ SOLN
1000.0000 ug | Freq: Once | INTRAMUSCULAR | Status: AC
  Administered 2021-03-14: 1000 ug via INTRAMUSCULAR

## 2021-03-14 NOTE — Progress Notes (Signed)
Per orders of Dr. Graham Duncan, injection of B-12 given by Darron Stuck Y Dianah Pruett in left deltoid. Patient tolerated injection well. Patient will make appointment for 1 month.   

## 2021-03-18 ENCOUNTER — Other Ambulatory Visit: Payer: Self-pay | Admitting: Endocrinology

## 2021-03-20 ENCOUNTER — Encounter: Payer: Self-pay | Admitting: Endocrinology

## 2021-03-21 ENCOUNTER — Other Ambulatory Visit: Payer: Self-pay | Admitting: Endocrinology

## 2021-03-21 NOTE — Telephone Encounter (Signed)
I have sent a prescription to your pharmacy, to change

## 2021-03-29 ENCOUNTER — Other Ambulatory Visit: Payer: Self-pay | Admitting: Family Medicine

## 2021-04-02 ENCOUNTER — Encounter: Payer: Self-pay | Admitting: Endocrinology

## 2021-04-03 ENCOUNTER — Other Ambulatory Visit: Payer: Self-pay

## 2021-04-03 DIAGNOSIS — E10649 Type 1 diabetes mellitus with hypoglycemia without coma: Secondary | ICD-10-CM

## 2021-04-03 MED ORDER — ONETOUCH DELICA LANCETS 30G MISC: 3 refills | Status: DC

## 2021-04-03 MED ORDER — ONETOUCH VERIO VI STRP: ORAL_STRIP | 3 refills | Status: DC

## 2021-04-03 NOTE — Telephone Encounter (Signed)
Rx sent 

## 2021-04-17 ENCOUNTER — Other Ambulatory Visit: Payer: Self-pay

## 2021-04-17 ENCOUNTER — Other Ambulatory Visit (INDEPENDENT_AMBULATORY_CARE_PROVIDER_SITE_OTHER): Payer: Medicare Other

## 2021-04-17 DIAGNOSIS — E538 Deficiency of other specified B group vitamins: Secondary | ICD-10-CM

## 2021-04-17 DIAGNOSIS — E119 Type 2 diabetes mellitus without complications: Secondary | ICD-10-CM

## 2021-04-17 LAB — CBC WITH DIFFERENTIAL/PLATELET
Basophils Absolute: 0 10*3/uL (ref 0.0–0.1)
Basophils Relative: 0.6 % (ref 0.0–3.0)
Eosinophils Absolute: 0.2 10*3/uL (ref 0.0–0.7)
Eosinophils Relative: 4.5 % (ref 0.0–5.0)
HCT: 47.2 % (ref 39.0–52.0)
Hemoglobin: 16.4 g/dL (ref 13.0–17.0)
Lymphocytes Relative: 24.3 % (ref 12.0–46.0)
Lymphs Abs: 1.3 10*3/uL (ref 0.7–4.0)
MCHC: 34.7 g/dL (ref 30.0–36.0)
MCV: 95.1 fl (ref 78.0–100.0)
Monocytes Absolute: 0.5 10*3/uL (ref 0.1–1.0)
Monocytes Relative: 9.8 % (ref 3.0–12.0)
Neutro Abs: 3.1 10*3/uL (ref 1.4–7.7)
Neutrophils Relative %: 60.8 % (ref 43.0–77.0)
Platelets: 172 10*3/uL (ref 150.0–400.0)
RBC: 4.96 Mil/uL (ref 4.22–5.81)
RDW: 13.1 % (ref 11.5–15.5)
WBC: 5.1 10*3/uL (ref 4.0–10.5)

## 2021-04-17 LAB — COMPREHENSIVE METABOLIC PANEL
ALT: 23 U/L (ref 0–53)
AST: 27 U/L (ref 0–37)
Albumin: 4.5 g/dL (ref 3.5–5.2)
Alkaline Phosphatase: 85 U/L (ref 39–117)
BUN: 20 mg/dL (ref 6–23)
CO2: 29 mEq/L (ref 19–32)
Calcium: 9.8 mg/dL (ref 8.4–10.5)
Chloride: 100 mEq/L (ref 96–112)
Creatinine, Ser: 1.14 mg/dL (ref 0.40–1.50)
GFR: 66.98 mL/min (ref >60.00)
Glucose, Bld: 191 mg/dL — ABNORMAL HIGH (ref 70–99)
Potassium: 4.8 mEq/L (ref 3.5–5.1)
Sodium: 137 mEq/L (ref 135–145)
Total Bilirubin: 0.9 mg/dL (ref 0.2–1.2)
Total Protein: 6.8 g/dL (ref 6.0–8.3)

## 2021-04-17 LAB — LIPID PANEL
Cholesterol: 189 mg/dL (ref 0–200)
HDL: 40.2 mg/dL (ref >39.00)
LDL Cholesterol: 118 mg/dL — ABNORMAL HIGH (ref 0–99)
NonHDL: 148.6
Total CHOL/HDL Ratio: 5
Triglycerides: 154 mg/dL — ABNORMAL HIGH (ref 0.0–149.0)
VLDL: 30.8 mg/dL (ref 0.0–40.0)

## 2021-04-17 LAB — TSH: TSH: 0.18 u[IU]/mL — ABNORMAL LOW (ref 0.35–4.50)

## 2021-04-17 LAB — HEMOGLOBIN A1C: Hgb A1c MFr Bld: 7.7 % — ABNORMAL HIGH (ref 4.6–6.5)

## 2021-04-17 LAB — FERRITIN: Ferritin: 59.5 ng/mL (ref 22.0–322.0)

## 2021-04-17 LAB — PSA, MEDICARE: PSA: 0.98 ng/ml (ref 0.10–4.00)

## 2021-04-17 LAB — VITAMIN B12: Vitamin B-12: 636 pg/mL (ref 211–911)

## 2021-04-18 ENCOUNTER — Ambulatory Visit: Payer: BC Managed Care – PPO

## 2021-04-19 ENCOUNTER — Ambulatory Visit: Payer: Medicare Other

## 2021-04-21 ENCOUNTER — Encounter: Payer: Self-pay | Admitting: Pharmacist

## 2021-04-21 DIAGNOSIS — Z5181 Encounter for therapeutic drug level monitoring: Secondary | ICD-10-CM

## 2021-04-21 NOTE — Progress Notes (Signed)
[facility])                                            [place] Team                                        Statin Quality Measure Assessment    04/21/2021  Ryan Crane 12-04-1956 [id number]  Per review of chart and payor information, patient has a diagnosis of diabetes but is not currently filling a statin prescription.  This places patient into the SUPD (Statin Use In Patients with Diabetes) measure for CMS.    He is taking Ezetimibe and could have a previous statin trial I did not see.    The 10-year ASCVD risk score [name] [name], et al., 2013) is: 31.2%   Values used to calculate the score:     Age: 65 years     Sex: Male     Is Non-Hispanic African American: No     Diabetic: Yes     Tobacco smoker: No     Systolic Blood Pressure: 258 mmHg     Is BP treated: Yes     HDL Cholesterol: 40.2 mg/dL     Total Cholesterol: 189 mg/dL   Patient has an upcoming appointment on 04/24/21.  Please assess if patient is a candidate for statin therapy and start a statin or associate an exclusion code with your upcoming visit.       Component Value Date/Time   CHOL 189 04/17/2021 0738   TRIG 154.0 (H) 04/17/2021 0738   TRIG 185 (H) 12/17/2006 0845   HDL 40.20 04/17/2021 0738   CHOLHDL 5 04/17/2021 0738   VLDL 30.8 04/17/2021 0738   LDLCALC 118 (H) 04/17/2021 0738   LDLDIRECT 120.8 01/16/2011 0818    Please consider ONE of the following recommendations:  ? Initiate high intensity statin Atorvastatin 40mg  once daily, #90, 3 refills   Rosuvastatin 20mg  once daily, #90, 3 refills    ? Initiate moderate intensity          statin with reduced frequency if prior          statin intolerance 1x weekly, #13, 3 refills   2x weekly, #26, 3 refills   3x weekly, #39, 3 refills    ? Code for past statin intolerance or  other exclusions (required annually)  Provider Requirements: ? Associate code during an office visit or  telehealth encounter  Drug Induced Myopathy G72.0   Myopathy, unspecified G72.9   Myositis, unspecified M60.9   Rhabdomyolysis N27.78   Alcoholic fatty liver E42.3   Cirrhosis of liver K74.69   Prediabetes R73.03   PCOS E28.2   Toxic liver disease, unspecified K71.9   Adverse effect of antihyperlipidemic and antiarteriosclerotic drugs, initial encounter [place], PharmD, [place] Clinical Pharmacist [phone]

## 2021-04-24 ENCOUNTER — Other Ambulatory Visit: Payer: Self-pay

## 2021-04-24 ENCOUNTER — Ambulatory Visit (INDEPENDENT_AMBULATORY_CARE_PROVIDER_SITE_OTHER): Payer: Medicare Other | Admitting: Family Medicine

## 2021-04-24 ENCOUNTER — Encounter: Payer: Self-pay | Admitting: Family Medicine

## 2021-04-24 VITALS — BP 126/74 | HR 74 | Temp 98.2°F | Ht 68.0 in | Wt 169.0 lb

## 2021-04-24 DIAGNOSIS — I10 Essential (primary) hypertension: Secondary | ICD-10-CM | POA: Diagnosis not present

## 2021-04-24 DIAGNOSIS — Z7189 Other specified counseling: Secondary | ICD-10-CM

## 2021-04-24 DIAGNOSIS — Z23 Encounter for immunization: Secondary | ICD-10-CM | POA: Diagnosis not present

## 2021-04-24 DIAGNOSIS — Z0000 Encounter for general adult medical examination without abnormal findings: Secondary | ICD-10-CM

## 2021-04-24 DIAGNOSIS — E538 Deficiency of other specified B group vitamins: Secondary | ICD-10-CM | POA: Diagnosis not present

## 2021-04-24 DIAGNOSIS — E119 Type 2 diabetes mellitus without complications: Secondary | ICD-10-CM

## 2021-04-24 DIAGNOSIS — E0500 Thyrotoxicosis with diffuse goiter without thyrotoxic crisis or storm: Secondary | ICD-10-CM

## 2021-04-24 DIAGNOSIS — N200 Calculus of kidney: Secondary | ICD-10-CM | POA: Diagnosis not present

## 2021-04-24 MED ORDER — SEMGLEE 100 UNIT/ML ~~LOC~~ SOPN: 24.0000 [IU] | PEN_INJECTOR | Freq: Every day | SUBCUTANEOUS | Status: DC

## 2021-04-24 MED ORDER — OXYCODONE-ACETAMINOPHEN 5-325 MG PO TABS: 1.0000 | ORAL_TABLET | Freq: Four times a day (QID) | ORAL | 0 refills | Status: DC | PRN

## 2021-04-24 MED ORDER — CYANOCOBALAMIN 1000 MCG/ML IJ SOLN
1000.0000 ug | Freq: Once | INTRAMUSCULAR | Status: AC
  Administered 2021-04-24: 1000 ug via INTRAMUSCULAR

## 2021-04-24 NOTE — Progress Notes (Signed)
This visit occurred during the SARS-CoV-2 public health emergency.  Safety protocols were in place, including screening questions prior to the visit, additional usage of staff PPE, and extensive cleaning of exam room while observing appropriate contact time as indicated for disinfecting solutions.  Tetanus 2019  Flu done yearly  Shingles shot d/w pt.   PNA shot 2017 covid vaccine d/w pt.   Colonoscopy 2013  PSA wnl Living will d/w pt. Would have his wife designated if pt were incapacitated.  Groin pain, mild.  Was doing heavy lifting recently.  Central groin area.  No lump or mass felt.    Hemochromatosis.  He is off red meat.  Labs d/w pt.  occ possible muscle spasms in the RUQ but no pain o/w.  No FCNAVD.  No jaundice.  TSH low.  Taking 150 mcg of levothyroxine daily.  Compliant.  Labs discussed with patient.  Has seen endocrinology previously.  B12 def. Due for dose today.  Compliant.  No adverse effect with replacement previously.  H/o renal stones, none recently.  rx done for oxycodone to have on hand in case of sx. no dysuria or blood in urine recently.  Hypertension:    Using medication without problems or lightheadedness: yes Chest pain with exertion:no Edema:no Short of breath: only as expected with sig exertion, ie after prolonged yard work.  Labs d/w pt.    Diabetes:  Using medications without difficulties: yes Hypoglycemic episodes: see below.  Cautions d/w pt.  Rare events.  He keeps sugar tab with him.   Hyperglycemic episodes: see below.   Feet problems:no Blood Sugars averaging: 80-300s.  Lower on the weekends with more physical activity.   eye exam within last year: yes Not yet on statin.  Will discuss with endocrine.  PMH and SH reviewed  Meds, vitals, and allergies reviewed.   ROS: Per HPI unless specifically indicated in ROS section   GEN: nad, alert and oriented HEENT: NCAT NECK: supple w/o LA CV: rrr. PULM: ctab, no inc wob ABD: soft, +bs EXT:  no edema SKIN: no acute rash Groin exam without lump or mass noted.  Normal-appearing skin.  No bruising or erythema.

## 2021-04-24 NOTE — Patient Instructions (Signed)
Check with your insurance to see if they will cover the shingles shot. Let me check with Dr. Loanne Drilling and we'll be in touch.  Take care.  Glad to see you. Take it easy and let me know if the groin discomfort doesn't resolve or if you have a bulging area.

## 2021-04-26 ENCOUNTER — Telehealth: Payer: Self-pay | Admitting: Family Medicine

## 2021-04-26 ENCOUNTER — Ambulatory Visit: Payer: Medicare Other

## 2021-04-26 DIAGNOSIS — Z0000 Encounter for general adult medical examination without abnormal findings: Secondary | ICD-10-CM | POA: Insufficient documentation

## 2021-04-26 DIAGNOSIS — E119 Type 2 diabetes mellitus without complications: Secondary | ICD-10-CM

## 2021-04-26 NOTE — Assessment & Plan Note (Signed)
  Tetanus 2019  Flu done yearly  Shingles shot d/w pt.  PNA shot 2017 covid vaccine d/w pt.  Colonoscopy 2013  PSA wnl Living will d/w pt. Would have his wife designated if pt were incapacitated.

## 2021-04-26 NOTE — Telephone Encounter (Signed)
I need your input.  His TSH was low.  I did not change his levothyroxine.   What are your thoughts on starting him on a statin, given his history of diabetes?  He is currently on fenofibrate.  I would prefer to only make 1 medication change at a time and I expected him to need a dose change with his levothyroxine.  Thank you.

## 2021-04-26 NOTE — Assessment & Plan Note (Signed)
A1c slightly higher.  Statin consideration discussed with patient.  I will ask for endocrine input.

## 2021-04-26 NOTE — Assessment & Plan Note (Signed)
Wife designated patient were incapacitated.

## 2021-04-26 NOTE — Assessment & Plan Note (Signed)
Now with low TSH.  We will ask for input from endocrinology.

## 2021-04-26 NOTE — Assessment & Plan Note (Signed)
Prescription written for oxycodone in case he has return of symptoms.

## 2021-04-26 NOTE — Assessment & Plan Note (Signed)
Would continue lisinopril.  If he has any changes in terms of chest pain or shortness of breath and he will let me know.  Continue work on diet and exercise.

## 2021-04-26 NOTE — Assessment & Plan Note (Signed)
Ferritin not elevated.  Labs discussed with patient.  Would continue avoiding red meat.

## 2021-04-26 NOTE — Assessment & Plan Note (Signed)
Continue B12 replacement.  Dose done today.

## 2021-04-27 NOTE — Telephone Encounter (Signed)
S I am fine with both, even at the same time.  Anything you think needs to be done, I am good with. S

## 2021-04-30 MED ORDER — LEVOTHYROXINE SODIUM 150 MCG PO TABS: 150.0000 ug | ORAL_TABLET | Freq: Every day | ORAL | 3 refills | Status: DC

## 2021-04-30 MED ORDER — PRAVASTATIN SODIUM 10 MG PO TABS: 10.0000 mg | ORAL_TABLET | Freq: Every evening | ORAL | 3 refills | Status: DC

## 2021-04-30 NOTE — Telephone Encounter (Signed)
Please update patient that I checked with endocrinology.  I would decrease his levothyroxine slightly.  He should be taking 150 mcg once a day on an empty stomach.  I would change that to 1 pill a day except for 1/2 tablet on Sundays.  He will decrease from a total of 7 tablets in a week to 6.5 tablets in a week.  I would recheck a TSH in about 2 months.  I sent a prescription for pravastatin for his cholesterol, given his history of diabetes.  This is a low dose, 10 mg taken at night.  I would add that onto his other medications.  If he starts the medication but notices muscle aches then stop the medication and let me know.  If he tolerates the medication then we can recheck his lipids and liver tests along with his TSH in about 2 months.  I think it makes sense to make 1 change at a time so I would decrease the levothyroxine first for a week or 2 and then add on pravastatin.  I put in the follow-up orders and sent the prescription.  Thanks.

## 2021-04-30 NOTE — Addendum Note (Signed)
Addended by: Tonia Ghent on: 04/30/2021 05:00 PM   Modules accepted: Orders

## 2021-05-01 NOTE — Telephone Encounter (Signed)
Spoke with patient and advised on message below. Patient verbalized understanding and will cut back levothyroxine and start pravastatin as instructed. Lab scheduled for 06/30/21 at 8:10 am.

## 2021-05-05 ENCOUNTER — Other Ambulatory Visit: Payer: Self-pay | Admitting: Endocrinology

## 2021-05-05 MED ORDER — LANTUS SOLOSTAR 100 UNIT/ML ~~LOC~~ SOPN: 23.0000 [IU] | PEN_INJECTOR | Freq: Every day | SUBCUTANEOUS | 99 refills | Status: DC

## 2021-05-23 ENCOUNTER — Ambulatory Visit: Payer: BC Managed Care – PPO

## 2021-05-25 ENCOUNTER — Other Ambulatory Visit: Payer: Self-pay | Admitting: Family Medicine

## 2021-05-30 ENCOUNTER — Other Ambulatory Visit: Payer: Self-pay

## 2021-05-30 ENCOUNTER — Ambulatory Visit (INDEPENDENT_AMBULATORY_CARE_PROVIDER_SITE_OTHER): Payer: Medicare Other | Admitting: *Deleted

## 2021-05-30 DIAGNOSIS — E538 Deficiency of other specified B group vitamins: Secondary | ICD-10-CM

## 2021-05-30 MED ORDER — CYANOCOBALAMIN 1000 MCG/ML IJ SOLN
1000.0000 ug | Freq: Once | INTRAMUSCULAR | Status: AC
  Administered 2021-05-30: 1000 ug via INTRAMUSCULAR

## 2021-05-30 NOTE — Progress Notes (Signed)
Per orders of Dr. Damita Dunnings, injection of Vitamin B12 given by Lauralyn Primes. Patient tolerated injection well.

## 2021-06-25 ENCOUNTER — Other Ambulatory Visit: Payer: Self-pay | Admitting: Family Medicine

## 2021-06-25 ENCOUNTER — Other Ambulatory Visit: Payer: Self-pay | Admitting: Endocrinology

## 2021-06-25 DIAGNOSIS — E10649 Type 1 diabetes mellitus with hypoglycemia without coma: Secondary | ICD-10-CM

## 2021-06-30 ENCOUNTER — Other Ambulatory Visit: Payer: Self-pay

## 2021-06-30 ENCOUNTER — Other Ambulatory Visit (INDEPENDENT_AMBULATORY_CARE_PROVIDER_SITE_OTHER): Payer: Medicare Other

## 2021-06-30 DIAGNOSIS — E119 Type 2 diabetes mellitus without complications: Secondary | ICD-10-CM | POA: Diagnosis not present

## 2021-06-30 LAB — LIPID PANEL
Cholesterol: 151 mg/dL (ref 0–200)
HDL: 52.4 mg/dL (ref >39.00)
LDL Cholesterol: 84 mg/dL (ref 0–99)
NonHDL: 98.29
Total CHOL/HDL Ratio: 3
Triglycerides: 73 mg/dL (ref 0.0–149.0)
VLDL: 14.6 mg/dL (ref 0.0–40.0)

## 2021-06-30 LAB — HEPATIC FUNCTION PANEL
ALT: 23 U/L (ref 0–53)
AST: 28 U/L (ref 0–37)
Albumin: 4.4 g/dL (ref 3.5–5.2)
Alkaline Phosphatase: 101 U/L (ref 39–117)
Bilirubin, Direct: 0.3 mg/dL (ref 0.0–0.3)
Total Bilirubin: 1 mg/dL (ref 0.2–1.2)
Total Protein: 6.8 g/dL (ref 6.0–8.3)

## 2021-06-30 LAB — TSH: TSH: 0.65 u[IU]/mL (ref 0.35–5.50)

## 2021-07-03 ENCOUNTER — Ambulatory Visit (INDEPENDENT_AMBULATORY_CARE_PROVIDER_SITE_OTHER): Payer: Medicare Other | Admitting: Endocrinology

## 2021-07-03 ENCOUNTER — Other Ambulatory Visit: Payer: Self-pay | Admitting: Endocrinology

## 2021-07-03 ENCOUNTER — Other Ambulatory Visit: Payer: Self-pay

## 2021-07-03 VITALS — BP 126/70 | HR 74 | Ht 68.0 in | Wt 170.0 lb

## 2021-07-03 DIAGNOSIS — E10649 Type 1 diabetes mellitus with hypoglycemia without coma: Secondary | ICD-10-CM

## 2021-07-03 LAB — POCT GLYCOSYLATED HEMOGLOBIN (HGB A1C): Hemoglobin A1C: 6.8 % — AB (ref 4.0–5.6)

## 2021-07-03 MED ORDER — INSULIN LISPRO (1 UNIT DIAL) 100 UNIT/ML (KWIKPEN): PEN_INJECTOR | SUBCUTANEOUS | 3 refills | Status: DC

## 2021-07-03 MED ORDER — LANTUS SOLOSTAR 100 UNIT/ML ~~LOC~~ SOPN: 26.0000 [IU] | PEN_INJECTOR | Freq: Every day | SUBCUTANEOUS | 99 refills | Status: DC

## 2021-07-03 NOTE — Progress Notes (Signed)
Subjective:    Patient ID: Ryan Crane, male    DOB: Nov 22, 1956, 65 y.o.   MRN: [id number]  HPI Pt returns for f/u of diabetes mellitus:  DM type: 1 Dx'ed: 8110 Complications: CRI Therapy: insulin since 2019. DKA: never.  Severe hypoglycemia: 1 possible episode 2020 Pancreatitis: never Pancreatic imaging: no mention is made on 2018 abd [place].  Other: he takes multiple daily injections; he declines pump and continuous glucose monitor.   Interval history:  He takes insulin as rx'ed.  He brings a record of his cbg's which I have reviewed today.  cbg varies from 52-316.  It is in general lowest after breakfast, and highest at HS. He has mild hypoglycemia appprox twice/week. He takes Humalog 3 times a day (just before each meal) 16-18-18 units, and lantus is 26 units qhs.   Past Medical History:  Diagnosis Date   Allergy    Arthritis    Cholelithiasis    symptomatic   Diabetes mellitus ([place])    Family history of malignant neoplasm of gastrointestinal tract    Fatty liver    GERD (gastroesophageal reflux disease)    Graves disease    s/p ablation   Hemochromatosis    History of chicken pox    History of hiatal hernia    History of kidney stones    Hyperlipidemia    Hypertension    Internal hemorrhoids    Kidney stones     Personal history of colonic polyps 03/31/2008   tubular adenoma   Right fibular fracture 2013    Past Surgical History:  Procedure Laterality Date   CHOLECYSTECTOMY N/A 05/29/2017   Procedure: LAPAROSCOPIC CHOLECYSTECTOMY;  Surgeon: Clovis Riley, MD;  Location: [facility];  Service: General;  Laterality: N/A;   COLONOSCOPY W/ BIOPSIES AND POLYPECTOMY     LITHOTRIPSY     URETERAL STENT PLACEMENT     left    Social History   Socioeconomic History   Marital status: Married    Spouse name: Not on file   Number of children: 1   Years of education: Not on file   Highest education level: Not on file  Occupational History   Occupation: PROJECT MANAGER     Employer: [employer]  Tobacco Use   Smoking status: Never   Smokeless tobacco: Never  Vaping Use   Vaping Use: Never used  Substance and Sexual Activity   Alcohol use: No    Alcohol/week: 0.0 standard drinks   Drug use: No   Sexual activity: Yes  Other Topics Concern   Not on file  Social History Narrative   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 E11.9     albuterol (VENTOLIN HFA) 108 (90 Base) MCG/ACT inhaler Inhale 2 puffs into the lungs every 6 (six) hours as needed for wheezing or shortness of breath. 8 g 5   aspirin 81 MG tablet Take 81 mg by mouth daily.     BD PEN  NEEDLE NANO 2ND GEN 32G X 4 MM MISC 1 EACH BY OTHER ROUTE 4 (FOUR) TIMES DAILY. 400 each 0   Blood Glucose Monitoring Suppl (ACCU-CHEK GUIDE ME) w/Device KIT 1 each by Does not apply route 4 (four) times daily. E11.9 1 kit 0   cyanocobalamin (,VITAMIN B-12,) 1000 MCG/ML injection Inject 1,000 mcg into the muscle every 30 (thirty) days.     diclofenac sodium (VOLTAREN) 1 % GEL Apply 2 g topically 4 (four) times daily as needed. 100 g 12   fenofibrate 160 MG tablet TAKE 1 TABLET BY MOUTH EVERY DAY 90 tablet 0   fluticasone (FLONASE) 50 MCG/ACT nasal spray Place 1 spray into both nostrils daily.     glucose blood (ONETOUCH VERIO) test strip USE TO TEST BLOOD SUGARS AS DIRECTED 7 TIMES A DAY. 700 strip 3   Insulin Syringe-Needle U-100 30G X 1/2" 0.5 ML MISC 1 each by Does not apply route daily. E11.9 90 each 0   levothyroxine (SYNTHROID) 150 MCG tablet  Take 1 tablet (150 mcg total) by mouth daily. Except for 1/2 tablet on Sundays.  Total of 6.5 tablets in a week. 90 tablet 3   lisinopril (ZESTRIL) 20 MG tablet TAKE 1 TABLET BY MOUTH EVERY DAY 90 tablet 1   omeprazole (PRILOSEC) 40 MG capsule TAKE 1 CAPSULE BY MOUTH EVERY DAY 90 capsule 1   OneTouch Delica Lancets 09W MISC Use to check BS 7x a day 700 each 3   oxyCODONE-acetaminophen (PERCOCET/ROXICET) 5-325 MG tablet Take 1-2 tablets by mouth every 6 (six) hours as needed (for kidney stones). 10 tablet 0   pravastatin (PRAVACHOL) 10 MG tablet Take 1 tablet (10 mg total) by mouth at bedtime. 90 tablet 3   No current facility-administered medications on file prior to visit.    Allergies  Allergen Reactions   Flomax [Tamsulosin Hcl]     nausea    Family History  Problem Relation Age of Onset   COPD Mother    Arthritis Mother    Diabetes Mother    Hypertension Mother    Lung cancer Father    Colon cancer Maternal Grandmother 59   Prostate cancer Neg Hx    Esophageal cancer Neg Hx    Rectal cancer Neg Hx    Stomach cancer Neg Hx    Thyroid disease Neg Hx     BP 126/70 (BP Location: Right Arm, Patient Position: Sitting, Cuff Size: Normal)   Pulse 74   Ht $R'5\' 8"'Nl$  (1.727 m)   Wt 170 lb (77.1 kg)   SpO2 96%   BMI 25.85 kg/m    Review of Systems     Objective:   Physical Exam Pulses: dorsalis pedis intact bilat.   MSK: no deformity of the feet.  CV: no leg edema.  Skin:  no ulcer on the feet.  normal color and temp on the feet.  Neuro: sensation is intact to touch on the feet.    A1c=6.8%     Assessment & Plan:  Type 1 DM Hypoglycemia, due to insulin: The pattern of his cbg's indicates he needs some adjustment in his therapy  Patient Instructions  check your blood sugar 4 times a day: before the 3 meals, and at bedtime.  also check if you have symptoms of your blood sugar being too high or too low.  please keep a record of the readings and bring it to your next  appointment here (or you can bring the meter itself).  You can write it  on any piece of paper.  please call us sooner if your blood sugar goes below 70, or if you have a lot of readings over 200.   Please change the humalog to the numbers listed below, and:  Please continue the same Lantus.   Please come back for a follow-up appointment in 4 months.

## 2021-07-03 NOTE — Patient Instructions (Addendum)
check your blood sugar 4 times a day: before the 3 meals, and at bedtime.  also check if you have symptoms of your blood sugar being too high or too low.  please keep a record of the readings and bring it to your next appointment here (or you can bring the meter itself).  You can write it on any piece of paper.  please call us sooner if your blood sugar goes below 70, or if you have a lot of readings over 200.   Please change the humalog to the numbers listed below, and:  Please continue the same Lantus.   Please come back for a follow-up appointment in 4 months.

## 2021-07-04 ENCOUNTER — Ambulatory Visit (INDEPENDENT_AMBULATORY_CARE_PROVIDER_SITE_OTHER): Payer: Medicare Other

## 2021-07-04 ENCOUNTER — Other Ambulatory Visit: Payer: Self-pay

## 2021-07-04 DIAGNOSIS — E538 Deficiency of other specified B group vitamins: Secondary | ICD-10-CM | POA: Diagnosis not present

## 2021-07-04 MED ORDER — CYANOCOBALAMIN 1000 MCG/ML IJ SOLN
1000.0000 ug | Freq: Once | INTRAMUSCULAR | Status: AC
  Administered 2021-07-04: 1000 ug via INTRAMUSCULAR

## 2021-07-04 NOTE — Progress Notes (Signed)
Per orders of Dr. Damita Dunnings, injection of monthly B12 1000 mcg/ml given by Pilar Grammes, CMA in right deltoid. Patient tolerated injection well.

## 2021-07-12 ENCOUNTER — Telehealth: Payer: Self-pay | Admitting: Family Medicine

## 2021-07-12 DIAGNOSIS — K862 Cyst of pancreas: Secondary | ICD-10-CM

## 2021-07-12 NOTE — Telephone Encounter (Signed)
Please call patient.  Due for follow-up MRI regarding his pancreas.  It was most recently done last year.  I put in the order.  I did not want him to be surprised when he got a phone call about this.  Thanks.

## 2021-07-14 NOTE — Telephone Encounter (Signed)
Patient notified about MRI order. He will await a call to schedule.

## 2021-07-29 ENCOUNTER — Other Ambulatory Visit: Payer: Self-pay | Admitting: Endocrinology

## 2021-07-30 ENCOUNTER — Other Ambulatory Visit: Payer: Self-pay

## 2021-07-30 ENCOUNTER — Ambulatory Visit
Admission: RE | Admit: 2021-07-30 | Discharge: 2021-07-30 | Disposition: A | Discharge status: Home / Self Care | Payer: Medicare Other | Source: Ambulatory Visit | Attending: Family Medicine | Admitting: Family Medicine

## 2021-07-30 DIAGNOSIS — K862 Cyst of pancreas: Secondary | ICD-10-CM

## 2021-07-30 DIAGNOSIS — K8689 Other specified diseases of pancreas: Secondary | ICD-10-CM | POA: Diagnosis not present

## 2021-07-30 MED ORDER — GADOBENATE DIMEGLUMINE 529 MG/ML IV SOLN
16.0000 mL | Freq: Once | INTRAVENOUS | Status: AC | PRN
  Administered 2021-07-30: 16 mL via INTRAVENOUS

## 2021-08-01 ENCOUNTER — Encounter: Payer: Self-pay | Admitting: Family Medicine

## 2021-08-07 LAB — HM DIABETES EYE EXAM

## 2021-08-08 ENCOUNTER — Other Ambulatory Visit: Payer: Self-pay

## 2021-08-08 ENCOUNTER — Ambulatory Visit (INDEPENDENT_AMBULATORY_CARE_PROVIDER_SITE_OTHER): Payer: Medicare Other

## 2021-08-08 DIAGNOSIS — E538 Deficiency of other specified B group vitamins: Secondary | ICD-10-CM | POA: Diagnosis not present

## 2021-08-08 MED ORDER — CYANOCOBALAMIN 1000 MCG/ML IJ SOLN
1000.0000 ug | Freq: Once | INTRAMUSCULAR | Status: AC
  Administered 2021-08-08: 1000 ug via INTRAMUSCULAR

## 2021-08-08 NOTE — Progress Notes (Signed)
Patient presented for B 12 injection given by Deion Forgue, CMA to left deltoid, patient voiced no concerns nor showed any signs of distress during injection.  

## 2021-09-11 ENCOUNTER — Other Ambulatory Visit: Payer: Self-pay | Admitting: Family Medicine

## 2021-09-12 ENCOUNTER — Ambulatory Visit (INDEPENDENT_AMBULATORY_CARE_PROVIDER_SITE_OTHER): Payer: Medicare Other

## 2021-09-12 ENCOUNTER — Ambulatory Visit: Payer: Medicare Other

## 2021-09-12 ENCOUNTER — Other Ambulatory Visit: Payer: Self-pay

## 2021-09-12 DIAGNOSIS — E538 Deficiency of other specified B group vitamins: Secondary | ICD-10-CM | POA: Diagnosis not present

## 2021-09-12 MED ORDER — CYANOCOBALAMIN 1000 MCG/ML IJ SOLN
1000.0000 ug | Freq: Once | INTRAMUSCULAR | Status: AC
  Administered 2021-09-12: 1000 ug via INTRAMUSCULAR

## 2021-09-12 NOTE — Progress Notes (Signed)
Per orders of Dr. Damita Dunnings, injection of monthly B12 1000 mcg/ml given by Pilar Grammes. CMA in Right Deltoid. Patient tolerated injection well.

## 2021-09-12 NOTE — Progress Notes (Deleted)
.  nvinj

## 2021-09-29 ENCOUNTER — Other Ambulatory Visit: Payer: Self-pay | Admitting: Endocrinology

## 2021-09-29 DIAGNOSIS — E10649 Type 1 diabetes mellitus with hypoglycemia without coma: Secondary | ICD-10-CM

## 2021-10-19 ENCOUNTER — Ambulatory Visit (INDEPENDENT_AMBULATORY_CARE_PROVIDER_SITE_OTHER): Payer: Medicare Other

## 2021-10-19 ENCOUNTER — Other Ambulatory Visit: Payer: Self-pay

## 2021-10-19 DIAGNOSIS — E538 Deficiency of other specified B group vitamins: Secondary | ICD-10-CM

## 2021-10-19 MED ORDER — CYANOCOBALAMIN 1000 MCG/ML IJ SOLN
1000.0000 ug | Freq: Once | INTRAMUSCULAR | Status: AC
  Administered 2021-10-19: 1000 ug via INTRAMUSCULAR

## 2021-10-19 NOTE — Progress Notes (Signed)
Per orders of Dr. Damita Dunnings, injection of B12 in Left Deltoid was given by Ophelia Shoulder. Patient tolerated injection well.

## 2021-10-22 ENCOUNTER — Other Ambulatory Visit: Payer: Self-pay | Admitting: Family Medicine

## 2021-11-06 ENCOUNTER — Ambulatory Visit (INDEPENDENT_AMBULATORY_CARE_PROVIDER_SITE_OTHER): Payer: Medicare Other | Admitting: Endocrinology

## 2021-11-06 ENCOUNTER — Other Ambulatory Visit: Payer: Self-pay

## 2021-11-06 VITALS — BP 136/70 | HR 59 | Ht 68.0 in | Wt 176.0 lb

## 2021-11-06 DIAGNOSIS — E10649 Type 1 diabetes mellitus with hypoglycemia without coma: Secondary | ICD-10-CM | POA: Diagnosis not present

## 2021-11-06 LAB — POCT GLYCOSYLATED HEMOGLOBIN (HGB A1C): Hemoglobin A1C: 7.1 % — AB (ref 4.0–5.6)

## 2021-11-06 MED ORDER — INSULIN LISPRO (1 UNIT DIAL) 100 UNIT/ML (KWIKPEN): PEN_INJECTOR | SUBCUTANEOUS | 3 refills | Status: DC

## 2021-11-06 NOTE — Patient Instructions (Addendum)
check your blood sugar 4 times a day: before the 3 meals, and at bedtime.  also check if you have symptoms of your blood sugar being too high or too low.  please keep a record of the readings and bring it to your next appointment here (or you can bring the meter itself).  You can write it on any piece of paper.  please call us sooner if your blood sugar goes below 70, or if you have a lot of readings over 200.   Please continue the same 2 insulins Please come back for a follow-up appointment in 4 months.

## 2021-11-06 NOTE — Progress Notes (Signed)
Subjective:    Patient ID: Ryan Crane, male    DOB: 13-Jan-1956, 65 y.o.   MRN: [id number]  HPI Pt returns for f/u of diabetes mellitus:  DM type: 1 Dx'ed: 2013 Complications: CRI Therapy: insulin since 2019. DKA: never.  Severe hypoglycemia: 1 possible episode 2020 Pancreatitis: never Pancreatic imaging: no mention is made on 2018 abd [place].  Other: he takes multiple daily injections; he declines pump and continuous glucose monitor.   Interval history: He brings a record of his cbg's which I have reviewed today.  cbg varies from 55-305.  There is no trend throughout the day. He has mild hypoglycemia approx once/month. He takes Humalog 3 times a day (just before each meal) 16-18-18 units, and lantus is 26 units qhs.   Past Medical History:  Diagnosis Date   Allergy    Arthritis    Cholelithiasis    symptomatic   Diabetes mellitus (HCC)    Family history of malignant neoplasm of gastrointestinal tract    Fatty liver    GERD (gastroesophageal reflux disease)    Graves disease    s/p ablation   Hemochromatosis    History of chicken pox    History of hiatal hernia    History of kidney stones    Hyperlipidemia    Hypertension    Internal hemorrhoids    Kidney stones     Personal history of colonic polyps 03/31/2008   tubular adenoma   Right fibular fracture 2013    Past Surgical History:  Procedure Laterality Date   CHOLECYSTECTOMY N/A 05/29/2017   Procedure: LAPAROSCOPIC CHOLECYSTECTOMY;  Surgeon: Berna Bue, MD;  Location: [facility];  Service: General;  Laterality: N/A;   COLONOSCOPY W/ BIOPSIES AND POLYPECTOMY     LITHOTRIPSY     URETERAL STENT PLACEMENT     left    Social History   Socioeconomic History   Marital status: Married    Spouse name: Not on file   Number of children: 1   Years of education: Not on file   Highest education level: Not on file  Occupational History   Occupation: PROJECT MANAGER    Employer: [employer]  Tobacco Use   Smoking  status: Never   Smokeless tobacco: Never  Vaping Use   Vaping Use: Never used  Substance and Sexual Activity   Alcohol use: No    Alcohol/week: 0.0 standard drinks   Drug use: No   Sexual activity: Yes  Other Topics Concern   Not on file  Social History Narrative   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 E11.9     albuterol (VENTOLIN HFA) 108 (90 Base) MCG/ACT inhaler Inhale 2 puffs into the lungs every 6 (six) hours as needed for wheezing or shortness of breath. 8 g 5   aspirin 81 MG tablet Take 81 mg by mouth daily.     BD PEN NEEDLE NANO 2ND GEN 32G X 4 MM MISC 1 EACH  BY OTHER ROUTE 4 (FOUR) TIMES DAILY. 400 each 0   Blood Glucose Monitoring Suppl (ACCU-CHEK GUIDE ME) w/Device KIT 1 each by Does not apply route 4 (four) times daily. E11.9 1 kit 0   cyanocobalamin (,VITAMIN B-12,) 1000 MCG/ML injection Inject 1,000 mcg into the muscle every 30 (thirty) days.     diclofenac sodium (VOLTAREN) 1 % GEL Apply 2 g topically 4 (four) times daily as needed. 100 g 12   fenofibrate 160 MG tablet TAKE 1 TABLET BY MOUTH EVERY DAY 90 tablet 0   fluticasone (FLONASE) 50 MCG/ACT nasal spray Place 1 spray into both nostrils daily.     insulin glargine (LANTUS SOLOSTAR) 100 UNIT/ML Solostar Pen Inject 26 Units into the skin daily. 30 mL PRN   Insulin Syringe-Needle U-100 30G X 1/2" 0.5 ML MISC 1 each by Does not apply route daily. E11.9 90 each 0   Lancets (ONETOUCH DELICA PLUS [id number]) MISC USE TO TEST BLOOD SUGAR 7 TIMES DAILY 200  each 5   levothyroxine (SYNTHROID) 150 MCG tablet Take 1 tablet (150 mcg total) by mouth daily. Except for 1/2 tablet on Sundays.  Total of 6.5 tablets in a week. 90 tablet 3   lisinopril (ZESTRIL) 20 MG tablet TAKE 1 TABLET BY MOUTH EVERY DAY 90 tablet 1   omeprazole (PRILOSEC) 40 MG capsule TAKE 1 CAPSULE BY MOUTH EVERY DAY 90 capsule 1   OneTouch Delica Lancets 95M MISC Use to check BS 7x a day 700 each 3   ONETOUCH VERIO test strip USE AS INSTRUCTED 100 strip 12   oxyCODONE-acetaminophen (PERCOCET/ROXICET) 5-325 MG tablet Take 1-2 tablets by mouth every 6 (six) hours as needed (for kidney stones). 10 tablet 0   pravastatin (PRAVACHOL) 10 MG tablet Take 1 tablet (10 mg total) by mouth at bedtime. 90 tablet 3   No current facility-administered medications on file prior to visit.    Allergies  Allergen Reactions   Flomax [Tamsulosin Hcl]     nausea    Family History  Problem Relation Age of Onset   COPD Mother    Arthritis Mother    Diabetes Mother    Hypertension Mother    Lung cancer Father    Colon cancer Maternal Grandmother 45   Prostate cancer Neg Hx    Esophageal cancer Neg Hx    Rectal cancer Neg Hx    Stomach cancer Neg Hx    Thyroid disease Neg Hx     BP 136/70   Pulse (!) 59   Ht $R'5\' 8"'yn$  (1.727 m)   Wt 176 lb (79.8 kg)   SpO2 93%   BMI 26.76 kg/m    Review of Systems     Objective:   Physical Exam    Lab Results  Component Value Date   HGBA1C 7.1 (A) 11/06/2021   Lab Results  Component Value Date   TSH 0.65 06/30/2021   Lab Results  Component Value Date   CREATININE 1.14 04/17/2021   BUN 20 04/17/2021   NA 137 04/17/2021   K 4.8 04/17/2021   CL 100 04/17/2021   CO2 29 04/17/2021       Assessment & Plan:  Type 1 DM Hypoglycemia, due to insulin: this limits aggressiveness of glycemic control.    Patient Instructions  check your blood sugar 4 times a day: before the 3 meals, and at bedtime.  also check if you have symptoms of your blood  sugar being too high or too low.  please keep a  record of the readings and bring it to your next appointment here (or you can bring the meter itself).  You can write it on any piece of paper.  please call us sooner if your blood sugar goes below 70, or if you have a lot of readings over 200.   Please continue the same 2 insulins Please come back for a follow-up appointment in 4 months.

## 2021-11-23 ENCOUNTER — Other Ambulatory Visit: Payer: Self-pay

## 2021-11-23 ENCOUNTER — Ambulatory Visit (INDEPENDENT_AMBULATORY_CARE_PROVIDER_SITE_OTHER): Payer: Medicare Other

## 2021-11-23 DIAGNOSIS — E538 Deficiency of other specified B group vitamins: Secondary | ICD-10-CM | POA: Diagnosis not present

## 2021-11-23 MED ORDER — CYANOCOBALAMIN 1000 MCG/ML IJ SOLN
1000.0000 ug | Freq: Once | INTRAMUSCULAR | Status: AC
  Administered 2021-11-23: 1000 ug via INTRAMUSCULAR

## 2021-11-23 NOTE — Progress Notes (Signed)
Per orders of Dr. Lorelei Pont in leu of Dr. Josefine Class absence, an injection of B12 was given by Ophelia Shoulder, CMA. Patient tolerated injection well.

## 2021-12-16 ENCOUNTER — Other Ambulatory Visit: Payer: Self-pay | Admitting: Family Medicine

## 2021-12-16 ENCOUNTER — Other Ambulatory Visit: Payer: Self-pay | Admitting: Endocrinology

## 2021-12-16 DIAGNOSIS — E10649 Type 1 diabetes mellitus with hypoglycemia without coma: Secondary | ICD-10-CM

## 2021-12-26 ENCOUNTER — Other Ambulatory Visit: Payer: Self-pay

## 2021-12-26 ENCOUNTER — Ambulatory Visit (INDEPENDENT_AMBULATORY_CARE_PROVIDER_SITE_OTHER): Payer: Medicare Other

## 2021-12-26 DIAGNOSIS — E538 Deficiency of other specified B group vitamins: Secondary | ICD-10-CM

## 2021-12-26 MED ORDER — CYANOCOBALAMIN 1000 MCG/ML IJ SOLN
1000.0000 ug | Freq: Once | INTRAMUSCULAR | Status: AC
  Administered 2021-12-26: 1000 ug via INTRAMUSCULAR

## 2021-12-26 NOTE — Progress Notes (Signed)
Per orders of Dr. Damita Dunnings, injection of B12 in Right Deltoid was given by Ophelia Shoulder. Patient tolerated injection well.

## 2022-01-10 ENCOUNTER — Other Ambulatory Visit: Payer: Self-pay | Admitting: Family Medicine

## 2022-01-28 ENCOUNTER — Other Ambulatory Visit: Payer: Self-pay | Admitting: Family Medicine

## 2022-01-28 ENCOUNTER — Other Ambulatory Visit: Payer: Self-pay | Admitting: Endocrinology

## 2022-01-28 DIAGNOSIS — E10649 Type 1 diabetes mellitus with hypoglycemia without coma: Secondary | ICD-10-CM

## 2022-01-29 NOTE — Telephone Encounter (Signed)
Patient will be due for appt in may; please call to schedule.

## 2022-01-30 ENCOUNTER — Ambulatory Visit (INDEPENDENT_AMBULATORY_CARE_PROVIDER_SITE_OTHER): Payer: Medicare Other

## 2022-01-30 ENCOUNTER — Other Ambulatory Visit: Payer: Self-pay

## 2022-01-30 DIAGNOSIS — E538 Deficiency of other specified B group vitamins: Secondary | ICD-10-CM

## 2022-01-30 MED ORDER — CYANOCOBALAMIN 1000 MCG/ML IJ SOLN
1000.0000 ug | Freq: Once | INTRAMUSCULAR | Status: AC
  Administered 2022-01-30: 1000 ug via INTRAMUSCULAR

## 2022-01-30 NOTE — Progress Notes (Signed)
Per orders of Dr. Tower, injection of vit B12 given by Caylea Foronda. Patient tolerated injection well.  

## 2022-02-27 ENCOUNTER — Ambulatory Visit: Payer: Medicare Other

## 2022-02-27 ENCOUNTER — Ambulatory Visit (INDEPENDENT_AMBULATORY_CARE_PROVIDER_SITE_OTHER): Payer: Medicare Other

## 2022-02-27 ENCOUNTER — Other Ambulatory Visit: Payer: Self-pay

## 2022-02-27 DIAGNOSIS — E538 Deficiency of other specified B group vitamins: Secondary | ICD-10-CM | POA: Diagnosis not present

## 2022-02-27 MED ORDER — CYANOCOBALAMIN 1000 MCG/ML IJ SOLN
1000.0000 ug | Freq: Once | INTRAMUSCULAR | Status: AC
  Administered 2022-02-27: 1000 ug via INTRAMUSCULAR

## 2022-02-27 NOTE — Progress Notes (Signed)
Per orders of Dr. Duncan, injection of monthly B12 1000 mcg/ml given by Mikal Blasdell P Sweet Jarvis, CMA in Right Deltoid. Patient tolerated injection well.  

## 2022-03-05 ENCOUNTER — Encounter: Payer: Self-pay | Admitting: Endocrinology

## 2022-03-05 ENCOUNTER — Ambulatory Visit: Payer: Medicare Other | Admitting: Endocrinology

## 2022-03-05 ENCOUNTER — Other Ambulatory Visit: Payer: Self-pay

## 2022-03-05 VITALS — BP 130/78 | HR 70 | Ht 68.0 in | Wt 174.0 lb

## 2022-03-05 DIAGNOSIS — E10649 Type 1 diabetes mellitus with hypoglycemia without coma: Secondary | ICD-10-CM | POA: Diagnosis not present

## 2022-03-05 LAB — POCT GLYCOSYLATED HEMOGLOBIN (HGB A1C): Hemoglobin A1C: 7.1 % — AB (ref 4.0–5.6)

## 2022-03-05 NOTE — Progress Notes (Signed)
? ?Subjective:  ? ? Patient ID: Ryan Crane, male    DOB: 03/14/1956, 66 y.o.   MRN: [id number] ? ?HPI ?Pt returns for f/u of diabetes mellitus:  ?DM type: 1 ?Dx'ed: 2013 ?Complications: CRI ?Therapy: insulin since 2019. ?DKA: never.  ?Severe hypoglycemia: 1 possible episode 2020 ?Pancreatitis: never ?Pancreatic imaging: no mention is made on 2018 abd [place].  ?Other: he takes multiple daily injections; he declines pump and continuous glucose monitor.   ?Interval history: He brings a record of his cbg's which I have reviewed today.  cbg varies from 60-501, but most are in the 100's.  There is no trend throughout the day, except is in general higher PC than AC. He has mild hypoglycemia approx once/week.  This happens before lunch, or in the afternoon.   ?Past Medical History:  ?Diagnosis Date  ? Allergy   ? Arthritis   ? Cholelithiasis   ? symptomatic  ? Diabetes mellitus ([place])   ? Family history of malignant neoplasm of gastrointestinal tract   ? Fatty liver   ? GERD (gastroesophageal reflux disease)   ? Graves disease   ? s/p ablation  ? Hemochromatosis   ? History of chicken pox   ? History of hiatal hernia   ? History of kidney stones   ? Hyperlipidemia   ? Hypertension   ? Internal hemorrhoids   ? Kidney stones    ? Personal history of colonic polyps 03/31/2008  ? tubular adenoma  ? Right fibular fracture 2013  ? ? ?Past Surgical History:  ?Procedure Laterality Date  ? CHOLECYSTECTOMY N/A 05/29/2017  ? Procedure: LAPAROSCOPIC CHOLECYSTECTOMY;  Surgeon: Clovis Riley, MD;  Location: [place];  Service: General;  Laterality: N/A;  ? COLONOSCOPY W/ BIOPSIES AND POLYPECTOMY    ? LITHOTRIPSY    ? URETERAL STENT PLACEMENT    ? left  ? ? ?Social History  ? ?Socioeconomic History  ? Marital status: Married  ?  Spouse name: Not on file  ? Number of children: 1  ? Years of education: Not on file  ? Highest education level: Not on file  ?Occupational History  ? Occupation: PROJECT MANAGER  ?  Employer: [employer]  ?Tobacco Use   ? Smoking status: Never  ? Smokeless tobacco: Never  ?Vaping Use  ? Vaping Use: Never used  ?Substance and Sexual Activity  ? Alcohol use: No  ?  Alcohol/week: 0.0 standard drinks  ? Drug use: No  ? Sexual activity: Yes  ?Other Topics Concern  ? Not on file  ?Social History Narrative  ? 2 years of college  ? Manager at [employer]  ? Married 1977  ? 1 daughter  ? ?Social Determinants of Health  ? ?Financial Resource Strain: Not on file  ?Food Insecurity: Not on file  ?Transportation Needs: Not on file  ?Physical Activity: Not on file  ?Stress: Not on file  ?Social Connections: Not on file  ?Intimate Partner Violence: Not on file  ? ? ?Current Outpatient Medications on File Prior to Visit  ?Medication Sig Dispense Refill  ? Accu-Chek Softclix Lancets lancets 1 each by Other route 4 (four) times daily. E11.9    ? albuterol (VENTOLIN HFA) 108 (90 Base) MCG/ACT inhaler Inhale 2 puffs into the lungs every 6 (six) hours as needed for wheezing or shortness of breath. 8 g 5  ? aspirin 81 MG tablet Take 81 mg by mouth daily.    ? Blood Glucose Monitoring Suppl (ACCU-CHEK GUIDE ME) w/Device KIT  1 each by Does not apply route 4 (four) times daily. E11.9 1 kit 0  ? cyanocobalamin (,VITAMIN B-12,) 1000 MCG/ML injection Inject 1,000 mcg into the muscle every 30 (thirty) days.    ? diclofenac sodium (VOLTAREN) 1 % GEL Apply 2 g topically 4 (four) times daily as needed. 100 g 12  ? fenofibrate 160 MG tablet TAKE 1 TABLET BY MOUTH EVERY DAY 90 tablet 0  ? fluticasone (FLONASE) 50 MCG/ACT nasal spray Place 1 spray into both nostrils daily.    ? glucose blood (ONETOUCH VERIO) test strip USE TO TEST BLOOD SUGARS AS DIRECTED 7 TIMES A DAY. 700 strip 3  ? insulin glargine (LANTUS SOLOSTAR) 100 UNIT/ML Solostar Pen Inject 26 Units into the skin daily. 30 mL PRN  ? insulin lispro (HUMALOG KWIKPEN) 100 UNIT/ML KwikPen 3 times a day (just before each meal) 16-18-18 units 45 mL 3  ? Insulin Pen Needle (BD PEN NEEDLE NANO 2ND GEN) 32G X 4 MM  MISC USE 4 TIMES A DAY 400 each 0  ? Insulin Syringe-Needle U-100 30G X 1/2" 0.5 ML MISC 1 each by Does not apply route daily. E11.9 90 each 0  ? Lancets (ONETOUCH DELICA PLUS [id number]) MISC USE TO TEST BLOOD SUGAR 7 TIMES DAILY 200 each 5  ? levothyroxine (SYNTHROID) 150 MCG tablet Take 1 tablet (150 mcg total) by mouth daily. Except for 1/2 tablet on Sundays.  Total of 6.5 tablets in a week. 90 tablet 3  ? lisinopril (ZESTRIL) 20 MG tablet TAKE 1 TABLET BY MOUTH EVERY DAY 90 tablet 1  ? omeprazole (PRILOSEC) 40 MG capsule TAKE 1 CAPSULE BY MOUTH EVERY DAY 90 capsule 1  ? OneTouch Delica Lancets 52C MISC Use to check BS 7x a day 700 each 3  ? oxyCODONE-acetaminophen (PERCOCET/ROXICET) 5-325 MG tablet Take 1-2 tablets by mouth every 6 (six) hours as needed (for kidney stones). 10 tablet 0  ? pravastatin (PRAVACHOL) 10 MG tablet TAKE 1 TABLET BY MOUTH EVERYDAY AT BEDTIME 90 tablet 0  ? ?No current facility-administered medications on file prior to visit.  ? ? ?Allergies  ?Allergen Reactions  ? Flomax [Tamsulosin Hcl]   ?  nausea  ? ? ?Family History  ?Problem Relation Age of Onset  ? COPD Mother   ? Arthritis Mother   ? Diabetes Mother   ? Hypertension Mother   ? Lung cancer Father   ? Colon cancer Maternal Grandmother 32  ? Prostate cancer Neg Hx   ? Esophageal cancer Neg Hx   ? Rectal cancer Neg Hx   ? Stomach cancer Neg Hx   ? Thyroid disease Neg Hx   ? ? ?BP 130/78 (BP Location: Left Arm, Patient Position: Sitting, Cuff Size: Normal)   Pulse 70   Ht $R'5\' 8"'Sy$  (1.727 m)   Wt 174 lb (78.9 kg)   SpO2 97%   BMI 26.46 kg/m?  ? ? ?Review of Systems ? ?   ?Objective:  ? Physical Exam ?VITAL SIGNS:  See vs page.   ?GENERAL: no distress.   ? ? ?Lab Results  ?Component Value Date  ? HGBA1C 7.1 (A) 03/05/2022  ? ? ?   ?Assessment & Plan:  ?Type 1 DM.  ?Hypoglycemia, due to insulin: this limits aggressiveness of glycemic control.   ? ?Patient Instructions  ?check your blood sugar 4 times a day: before the 3 meals, and at  bedtime.  also check if you have symptoms of your blood sugar being too high or too low.  please keep a record of the readings and bring it to your next appointment here (or you can bring the meter itself).  You can write it on any piece of paper.  please call us sooner if your blood sugar goes below 70, or if you have a lot of readings over 200.   ?Please continue the same 2 insulins ?Please come back for a follow-up appointment in 4 months.  ? ? ?

## 2022-03-05 NOTE — Patient Instructions (Signed)
check your blood sugar 4 times a day: before the 3 meals, and at bedtime.  also check if you have symptoms of your blood sugar being too high or too low.  please keep a record of the readings and bring it to your next appointment here (or you can bring the meter itself).  You can write it on any piece of paper.  please call us sooner if your blood sugar goes below 70, or if you have a lot of readings over 200.   ?Please continue the same 2 insulins ?Please come back for a follow-up appointment in 4 months.  ?

## 2022-03-09 ENCOUNTER — Ambulatory Visit (INDEPENDENT_AMBULATORY_CARE_PROVIDER_SITE_OTHER): Payer: Medicare Other | Admitting: Nurse Practitioner

## 2022-03-09 ENCOUNTER — Ambulatory Visit (INDEPENDENT_AMBULATORY_CARE_PROVIDER_SITE_OTHER)
Admission: RE | Admit: 2022-03-09 | Discharge: 2022-03-09 | Disposition: A | Discharge status: Home / Self Care | Payer: Medicare Other | Source: Ambulatory Visit | Attending: Nurse Practitioner | Admitting: Nurse Practitioner

## 2022-03-09 ENCOUNTER — Encounter: Payer: Self-pay | Admitting: Nurse Practitioner

## 2022-03-09 VITALS — BP 132/64 | HR 77 | Temp 97.3°F | Resp 12 | Ht 68.0 in | Wt 176.4 lb

## 2022-03-09 DIAGNOSIS — R051 Acute cough: Secondary | ICD-10-CM | POA: Diagnosis not present

## 2022-03-09 DIAGNOSIS — R059 Cough, unspecified: Secondary | ICD-10-CM | POA: Diagnosis not present

## 2022-03-09 DIAGNOSIS — G4489 Other headache syndrome: Secondary | ICD-10-CM | POA: Diagnosis not present

## 2022-03-09 DIAGNOSIS — R062 Wheezing: Secondary | ICD-10-CM

## 2022-03-09 DIAGNOSIS — K449 Diaphragmatic hernia without obstruction or gangrene: Secondary | ICD-10-CM | POA: Diagnosis not present

## 2022-03-09 LAB — POC COVID19 BINAXNOW: SARS Coronavirus 2 Ag: NEGATIVE

## 2022-03-09 MED ORDER — GUAIFENESIN-CODEINE 100-10 MG/5ML PO SOLN: 5.0000 mL | Freq: Two times a day (BID) | ORAL | 0 refills | Status: DC | PRN

## 2022-03-09 MED ORDER — ALBUTEROL SULFATE HFA 108 (90 BASE) MCG/ACT IN AERS: 2.0000 | INHALATION_SPRAY | Freq: Four times a day (QID) | RESPIRATORY_TRACT | 1 refills | Status: DC | PRN

## 2022-03-09 NOTE — Assessment & Plan Note (Signed)
Negative COVID test in office.  Continue using over-the-counter analgesics as needed ?

## 2022-03-09 NOTE — Assessment & Plan Note (Signed)
No wheezing appreciated in office.  Chest x-ray negative in office.  Want to avoid steroids as patient is diabetic.  We will renew albuterol inhaler follow-up if no improvement ?

## 2022-03-09 NOTE — Assessment & Plan Note (Signed)
Will write codeine-guaifenesin cough syrup for patient 5 mL twice daily as needed cough.  Sedation precautions reviewed patient's chest x-ray negative in office.  Follow-up if no improvement ?

## 2022-03-09 NOTE — Patient Instructions (Signed)
Nice to see you today ?I will be in touch with the xray once I have the result ?Sent in the cough medication along with the albuterol inhaler ?Follow up if no improvement ?

## 2022-03-09 NOTE — Progress Notes (Signed)
? ?Acute Office Visit ? ?Subjective:  ? ? Patient ID: Ryan Crane, male    DOB: 04/08/1956, 66 y.o.   MRN: [id number] ? ?Chief Complaint  ?Patient presents with  ? Cough  ?  Sx started around Monday 03/05/22-cough with some dark productive phlegm. Wheezing the past 2 days, some headache. No sore throat. No fever.Using Flonase and inhaler that helps some.  ? ? ? ?Patient is in today for cough ? ?Symptoms started on Monday 03/05/2022 ?Started with a cough that is mainly dry with occass dark muscous ?Did have some wheezing and sinus headache. No history of lund disease. Has been using the inhaler and flonase with little relief ?No sick contact ?Covid vaccine x2 and flu vaccine up to date ?Use vicks and throat drops ? ?Past Medical History:  ?Diagnosis Date  ? Allergy   ? Arthritis   ? Cholelithiasis   ? symptomatic  ? Diabetes mellitus ([place])   ? Family history of malignant neoplasm of gastrointestinal tract   ? Fatty liver   ? GERD (gastroesophageal reflux disease)   ? Graves disease   ? s/p ablation  ? Hemochromatosis   ? History of chicken pox   ? History of hiatal hernia   ? History of kidney stones   ? Hyperlipidemia   ? Hypertension   ? Internal hemorrhoids   ? Kidney stones    ? Personal history of colonic polyps 03/31/2008  ? tubular adenoma  ? Right fibular fracture 2013  ? ? ?Past Surgical History:  ?Procedure Laterality Date  ? CHOLECYSTECTOMY N/A 05/29/2017  ? Procedure: LAPAROSCOPIC CHOLECYSTECTOMY;  Surgeon: Clovis Riley, MD;  Location: [facility];  Service: General;  Laterality: N/A;  ? COLONOSCOPY W/ BIOPSIES AND POLYPECTOMY    ? LITHOTRIPSY    ? URETERAL STENT PLACEMENT    ? left  ? ? ?Family History  ?Problem Relation Age of Onset  ? COPD Mother   ? Arthritis Mother   ? Diabetes Mother   ? Hypertension Mother   ? Lung cancer Father   ? Colon cancer Maternal Grandmother 51  ? Prostate cancer Neg Hx   ? Esophageal cancer Neg Hx   ? Rectal cancer Neg Hx   ? Stomach cancer Neg Hx   ? Thyroid disease Neg Hx    ? ? ?Social History  ? ?Socioeconomic History  ? Marital status: Married  ?  Spouse name: Not on file  ? Number of children: 1  ? Years of education: Not on file  ? Highest education level: Not on file  ?Occupational History  ? Occupation: PROJECT MANAGER  ?  Employer: [employer]  ?Tobacco Use  ? Smoking status: Never  ? Smokeless tobacco: Never  ?Vaping Use  ? Vaping Use: Never used  ?Substance and Sexual Activity  ? Alcohol use: No  ?  Alcohol/week: 0.0 standard drinks  ? Drug use: No  ? Sexual activity: Yes  ?Other Topics Concern  ? Not on file  ?Social History Narrative  ? 2 years of college  ? Manager at [employer]  ? Married 1977  ? 1 daughter  ? ?Social Determinants of Health  ? ?Financial Resource Strain: Not on file  ?Food Insecurity: Not on file  ?Transportation Needs: Not on file  ?Physical Activity: Not on file  ?Stress: Not on file  ?Social Connections: Not on file  ?Intimate Partner Violence: Not on file  ? ? ?Outpatient Medications Prior to Visit  ?Medication Sig Dispense Refill  ?  Accu-Chek Softclix Lancets lancets 1 each by Other route 4 (four) times daily. E11.9    ? aspirin 81 MG tablet Take 81 mg by mouth daily.    ? Blood Glucose Monitoring Suppl (ACCU-CHEK GUIDE ME) w/Device KIT 1 each by Does not apply route 4 (four) times daily. E11.9 1 kit 0  ? cyanocobalamin (,VITAMIN B-12,) 1000 MCG/ML injection Inject 1,000 mcg into the muscle every 30 (thirty) days.    ? diclofenac sodium (VOLTAREN) 1 % GEL Apply 2 g topically 4 (four) times daily as needed. 100 g 12  ? fenofibrate 160 MG tablet TAKE 1 TABLET BY MOUTH EVERY DAY 90 tablet 0  ? fluticasone (FLONASE) 50 MCG/ACT nasal spray Place 1 spray into both nostrils daily.    ? glucose blood (ONETOUCH VERIO) test strip USE TO TEST BLOOD SUGARS AS DIRECTED 7 TIMES A DAY. 700 strip 3  ? insulin glargine (LANTUS SOLOSTAR) 100 UNIT/ML Solostar Pen Inject 26 Units into the skin daily. 30 mL PRN  ? insulin lispro (HUMALOG KWIKPEN) 100 UNIT/ML KwikPen 3 times a  day (just before each meal) 16-18-18 units 45 mL 3  ? Insulin Pen Needle (BD PEN NEEDLE NANO 2ND GEN) 32G X 4 MM MISC USE 4 TIMES A DAY 400 each 0  ? Insulin Syringe-Needle U-100 30G X 1/2" 0.5 ML MISC 1 each by Does not apply route daily. E11.9 90 each 0  ? Lancets (ONETOUCH DELICA PLUS [id number]) MISC USE TO TEST BLOOD SUGAR 7 TIMES DAILY 200 each 5  ? levothyroxine (SYNTHROID) 150 MCG tablet Take 1 tablet (150 mcg total) by mouth daily. Except for 1/2 tablet on Sundays.  Total of 6.5 tablets in a week. 90 tablet 3  ? lisinopril (ZESTRIL) 20 MG tablet TAKE 1 TABLET BY MOUTH EVERY DAY 90 tablet 1  ? omeprazole (PRILOSEC) 40 MG capsule TAKE 1 CAPSULE BY MOUTH EVERY DAY 90 capsule 1  ? OneTouch Delica Lancets 40J MISC Use to check BS 7x a day 700 each 3  ? oxyCODONE-acetaminophen (PERCOCET/ROXICET) 5-325 MG tablet Take 1-2 tablets by mouth every 6 (six) hours as needed (for kidney stones). 10 tablet 0  ? pravastatin (PRAVACHOL) 10 MG tablet TAKE 1 TABLET BY MOUTH EVERYDAY AT BEDTIME 90 tablet 0  ? albuterol (VENTOLIN HFA) 108 (90 Base) MCG/ACT inhaler Inhale 2 puffs into the lungs every 6 (six) hours as needed for wheezing or shortness of breath. 8 g 5  ? ?No facility-administered medications prior to visit.  ? ? ?Allergies  ?Allergen Reactions  ? Flomax [Tamsulosin Hcl]   ?  nausea  ? ? ?Review of Systems  ?Constitutional:  Positive for appetite change. Negative for chills, fatigue and fever.  ?HENT:  Positive for sneezing. Negative for congestion, ear discharge, ear pain, postnasal drip, sinus pressure, sinus pain and sore throat.   ?Respiratory:  Positive for cough and wheezing. Negative for shortness of breath.   ?Cardiovascular:  Negative for chest pain.  ?Gastrointestinal:  Negative for diarrhea, nausea and vomiting.  ?Musculoskeletal:  Negative for arthralgias and myalgias.  ?Neurological:  Positive for headaches.  ? ?   ?Objective:  ?  ?Physical Exam ?Vitals and nursing note reviewed.  ?Constitutional:   ?    Appearance: Normal appearance.  ?HENT:  ?   Right Ear: Tympanic membrane, ear canal and external ear normal.  ?   Left Ear: Tympanic membrane, ear canal and external ear normal.  ?   Nose:  ?   Right Sinus: No maxillary sinus  tenderness or frontal sinus tenderness.  ?   Left Sinus: No maxillary sinus tenderness or frontal sinus tenderness.  ?   Mouth/Throat:  ?   Mouth: Mucous membranes are moist.  ?   Pharynx: Oropharynx is clear. No posterior oropharyngeal erythema.  ?Eyes:  ?   Comments: Wears corrective lenses  ?Cardiovascular:  ?   Rate and Rhythm: Normal rate and regular rhythm.  ?   Heart sounds: Normal heart sounds.  ?Pulmonary:  ?   Effort: Pulmonary effort is normal.  ?   Breath sounds: Normal breath sounds.  ?Abdominal:  ?   General: Bowel sounds are normal.  ?Lymphadenopathy:  ?   Cervical: No cervical adenopathy.  ?Neurological:  ?   Mental Status: He is alert.  ? ? ?BP 132/64   Pulse 77   Temp (!) 97.3 ?F (36.3 ?C)   Resp 12   Ht $R'5\' 8"'mg$  (1.727 m)   Wt 176 lb 6 oz (80 kg)   SpO2 97%   BMI 26.82 kg/m?  ?Wt Readings from Last 3 Encounters:  ?03/09/22 176 lb 6 oz (80 kg)  ?03/05/22 174 lb (78.9 kg)  ?11/06/21 176 lb (79.8 kg)  ? ? ?Health Maintenance Due  ?Topic Date Due  ? COVID-19 Vaccine (3 - Mixed Product risk series) 06/29/2020  ? COLONOSCOPY (Pts 45-20yrs Insurance coverage will need to be confirmed)  02/12/2022  ? ? ?There are no preventive care reminders to display for this patient. ? ? ?Lab Results  ?Component Value Date  ? TSH 0.65 06/30/2021  ? ?Lab Results  ?Component Value Date  ? WBC 5.1 04/17/2021  ? HGB 16.4 04/17/2021  ? HCT 47.2 04/17/2021  ? MCV 95.1 04/17/2021  ? PLT 172.0 04/17/2021  ? ?Lab Results  ?Component Value Date  ? NA 137 04/17/2021  ? K 4.8 04/17/2021  ? CO2 29 04/17/2021  ? GLUCOSE 191 (H) 04/17/2021  ? BUN 20 04/17/2021  ? CREATININE 1.14 04/17/2021  ? BILITOT 1.0 06/30/2021  ? ALKPHOS 101 06/30/2021  ? AST 28 06/30/2021  ? ALT 23 06/30/2021  ? PROT 6.8 06/30/2021  ?  ALBUMIN 4.4 06/30/2021  ? CALCIUM 9.8 04/17/2021  ? ANIONGAP 7 05/29/2017  ? GFR 66.98 04/17/2021  ? ?Lab Results  ?Component Value Date  ? CHOL 151 06/30/2021  ? ?Lab Results  ?Component Value Date  ? HDL

## 2022-03-25 ENCOUNTER — Other Ambulatory Visit: Payer: Self-pay | Admitting: Family Medicine

## 2022-04-03 ENCOUNTER — Ambulatory Visit (INDEPENDENT_AMBULATORY_CARE_PROVIDER_SITE_OTHER): Payer: Medicare Other

## 2022-04-03 DIAGNOSIS — E538 Deficiency of other specified B group vitamins: Secondary | ICD-10-CM

## 2022-04-03 MED ORDER — CYANOCOBALAMIN 1000 MCG/ML IJ SOLN
1000.0000 ug | Freq: Once | INTRAMUSCULAR | Status: AC
Start: 2022-04-03 — End: 2022-04-03
  Administered 2022-04-03: 1000 ug via INTRAMUSCULAR

## 2022-04-03 NOTE — Progress Notes (Signed)
? ?  Nurse Visit  ? ?Date of Encounter: 04/03/2022 ?ID: ELGAR SCOGGINS, DOB Aug 28, 1956, MRN [id number] ? ?PCP:  Tonia Ghent, MD ?  ? ? ?Visit Details  ? ?VS:  There were no vitals taken for this visit. Pt came in to receive vit b12  injection, pt was given in the Left deltoid IM 1,000 mcg/ml.   ? ? ? ? ?Medications Adjustments/Labs and Tests Ordered: ?No orders of the defined types were placed in this encounter. ? ?No orders of the defined types were placed in this encounter. ? ? ? ?Signed, ?Mardelle Matte, CMA  ?04/03/2022 2:39 PM   ?

## 2022-04-15 ENCOUNTER — Other Ambulatory Visit: Payer: Self-pay | Admitting: Family Medicine

## 2022-04-15 DIAGNOSIS — Z125 Encounter for screening for malignant neoplasm of prostate: Secondary | ICD-10-CM

## 2022-04-15 DIAGNOSIS — E538 Deficiency of other specified B group vitamins: Secondary | ICD-10-CM

## 2022-04-15 DIAGNOSIS — E0500 Thyrotoxicosis with diffuse goiter without thyrotoxic crisis or storm: Secondary | ICD-10-CM

## 2022-04-15 DIAGNOSIS — E119 Type 2 diabetes mellitus without complications: Secondary | ICD-10-CM

## 2022-04-17 ENCOUNTER — Other Ambulatory Visit (INDEPENDENT_AMBULATORY_CARE_PROVIDER_SITE_OTHER): Payer: Medicare Other

## 2022-04-17 DIAGNOSIS — E538 Deficiency of other specified B group vitamins: Secondary | ICD-10-CM | POA: Diagnosis not present

## 2022-04-17 DIAGNOSIS — Z125 Encounter for screening for malignant neoplasm of prostate: Secondary | ICD-10-CM | POA: Diagnosis not present

## 2022-04-17 DIAGNOSIS — E119 Type 2 diabetes mellitus without complications: Secondary | ICD-10-CM

## 2022-04-17 DIAGNOSIS — E0500 Thyrotoxicosis with diffuse goiter without thyrotoxic crisis or storm: Secondary | ICD-10-CM

## 2022-04-17 LAB — COMPREHENSIVE METABOLIC PANEL
ALT: 22 U/L (ref 0–53)
AST: 31 U/L (ref 0–37)
Albumin: 4.4 g/dL (ref 3.5–5.2)
Alkaline Phosphatase: 95 U/L (ref 39–117)
BUN: 16 mg/dL (ref 6–23)
CO2: 28 mEq/L (ref 19–32)
Calcium: 9.5 mg/dL (ref 8.4–10.5)
Chloride: 100 mEq/L (ref 96–112)
Creatinine, Ser: 1.19 mg/dL (ref 0.40–1.50)
GFR: 63.17 mL/min (ref >60.00)
Glucose, Bld: 146 mg/dL — ABNORMAL HIGH (ref 70–99)
Potassium: 4.7 mEq/L (ref 3.5–5.1)
Sodium: 137 mEq/L (ref 135–145)
Total Bilirubin: 1 mg/dL (ref 0.2–1.2)
Total Protein: 6.9 g/dL (ref 6.0–8.3)

## 2022-04-17 LAB — CBC WITH DIFFERENTIAL/PLATELET
Basophils Absolute: 0 10*3/uL (ref 0.0–0.1)
Basophils Relative: 0.7 % (ref 0.0–3.0)
Eosinophils Absolute: 0.3 10*3/uL (ref 0.0–0.7)
Eosinophils Relative: 5.8 % — ABNORMAL HIGH (ref 0.0–5.0)
HCT: 44 % (ref 39.0–52.0)
Hemoglobin: 15.4 g/dL (ref 13.0–17.0)
Lymphocytes Relative: 24.3 % (ref 12.0–46.0)
Lymphs Abs: 1.2 10*3/uL (ref 0.7–4.0)
MCHC: 35 g/dL (ref 30.0–36.0)
MCV: 96.3 fl (ref 78.0–100.0)
Monocytes Absolute: 0.7 10*3/uL (ref 0.1–1.0)
Monocytes Relative: 14.2 % — ABNORMAL HIGH (ref 3.0–12.0)
Neutro Abs: 2.7 10*3/uL (ref 1.4–7.7)
Neutrophils Relative %: 55 % (ref 43.0–77.0)
Platelets: 178 10*3/uL (ref 150.0–400.0)
RBC: 4.57 Mil/uL (ref 4.22–5.81)
RDW: 12.8 % (ref 11.5–15.5)
WBC: 4.9 10*3/uL (ref 4.0–10.5)

## 2022-04-17 LAB — LIPID PANEL
Cholesterol: 153 mg/dL (ref 0–200)
HDL: 35.7 mg/dL — ABNORMAL LOW (ref >39.00)
LDL Cholesterol: 92 mg/dL (ref 0–99)
NonHDL: 116.93
Total CHOL/HDL Ratio: 4
Triglycerides: 124 mg/dL (ref 0.0–149.0)
VLDL: 24.8 mg/dL (ref 0.0–40.0)

## 2022-04-17 LAB — FERRITIN: Ferritin: 106.8 ng/mL (ref 22.0–322.0)

## 2022-04-17 LAB — TSH: TSH: 0.27 u[IU]/mL — ABNORMAL LOW (ref 0.35–5.50)

## 2022-04-17 LAB — PSA, MEDICARE: PSA: 1.13 ng/ml (ref 0.10–4.00)

## 2022-04-17 LAB — VITAMIN B12: Vitamin B-12: 769 pg/mL (ref 211–911)

## 2022-04-24 ENCOUNTER — Ambulatory Visit (INDEPENDENT_AMBULATORY_CARE_PROVIDER_SITE_OTHER): Payer: Medicare Other | Admitting: Family Medicine

## 2022-04-24 ENCOUNTER — Encounter: Payer: Self-pay | Admitting: Family Medicine

## 2022-04-24 VITALS — BP 108/60 | HR 66 | Temp 97.3°F | Ht 68.0 in | Wt 171.0 lb

## 2022-04-24 DIAGNOSIS — K862 Cyst of pancreas: Secondary | ICD-10-CM

## 2022-04-24 DIAGNOSIS — E538 Deficiency of other specified B group vitamins: Secondary | ICD-10-CM | POA: Diagnosis not present

## 2022-04-24 DIAGNOSIS — E785 Hyperlipidemia, unspecified: Secondary | ICD-10-CM

## 2022-04-24 DIAGNOSIS — Z0000 Encounter for general adult medical examination without abnormal findings: Secondary | ICD-10-CM | POA: Diagnosis not present

## 2022-04-24 DIAGNOSIS — E10649 Type 1 diabetes mellitus with hypoglycemia without coma: Secondary | ICD-10-CM

## 2022-04-24 DIAGNOSIS — Z7189 Other specified counseling: Secondary | ICD-10-CM

## 2022-04-24 DIAGNOSIS — I10 Essential (primary) hypertension: Secondary | ICD-10-CM | POA: Diagnosis not present

## 2022-04-24 DIAGNOSIS — E0500 Thyrotoxicosis with diffuse goiter without thyrotoxic crisis or storm: Secondary | ICD-10-CM

## 2022-04-24 DIAGNOSIS — Z1211 Encounter for screening for malignant neoplasm of colon: Secondary | ICD-10-CM

## 2022-04-24 MED ORDER — OXYCODONE-ACETAMINOPHEN 5-325 MG PO TABS: 1.0000 | ORAL_TABLET | Freq: Four times a day (QID) | ORAL | 0 refills | Status: DC | PRN

## 2022-04-24 MED ORDER — PRAVASTATIN SODIUM 10 MG PO TABS: ORAL_TABLET | ORAL | 3 refills | Status: DC

## 2022-04-24 MED ORDER — OMEPRAZOLE 40 MG PO CPDR: 40.0000 mg | DELAYED_RELEASE_CAPSULE | Freq: Every day | ORAL | 3 refills | Status: DC

## 2022-04-24 MED ORDER — LEVOTHYROXINE SODIUM 150 MCG PO TABS: 150.0000 ug | ORAL_TABLET | Freq: Every day | ORAL | 3 refills | Status: DC

## 2022-04-24 MED ORDER — FENOFIBRATE 160 MG PO TABS: 160.0000 mg | ORAL_TABLET | Freq: Every day | ORAL | 3 refills | Status: DC

## 2022-04-24 MED ORDER — LISINOPRIL 20 MG PO TABS: 20.0000 mg | ORAL_TABLET | Freq: Every day | ORAL | 3 refills | Status: DC

## 2022-04-24 NOTE — Progress Notes (Signed)
I have personally reviewed the Medicare Annual Wellness questionnaire and have noted ?1. The patient's medical and social history ?2. Their use of alcohol, tobacco or illicit drugs ?3. Their current medications and supplements ?4. The patient's functional ability including ADL's, fall risks, home safety risks and hearing or visual ?            impairment. ?5. Diet and physical activities ?6. Evidence for depression or mood disorders ? ?The patients weight, height, BMI have been recorded in the chart and visual acuity is per eye clinic.  ?I have made referrals, counseling and provided education to the patient based review of the above and I have provided the pt with a written personalized care plan for preventive services. ? ?Provider list updated- see scanned forms.  Routine anticipatory guidance given to patient.  See health maintenance. The possibility exists that previously documented standard health maintenance information may have been brought forward from a previous encounter into this note.  If needed, that same information has been updated to reflect the current situation based on today's encounter.   ? ?Flu previously ?Shingles previously done ?PNA up-to-date ?Tetanus 2019 ?COVID-vaccine previously done. ?Colonoscopy-referred 2023 ?Prostate cancer screening 2020 ?Advance directive-wife designated patient were incapacitated ?Cognitive function addressed- see scanned forms- and if abnormal then additional documentation follows.  ? ?In addition to [facility] Wellness, follow up visit for the below conditions: ? ?D/w pt about endo care.  His endo MD is retiring.  Discussed with patient that I will check with endocrinology to see about follow-up options.  Rare foot tingling.  A1c controlled reasonably per endo.   ? ?Hemochromatosis.  Eliminated red meat, labs d/w pt. No recent phlebotomy/need.  Labs discussed with patient.  We can recheck his ferritin later on. ? ?Hypertension:    ?Using medication without  problems or lightheadedness: yes ?Chest pain with exertion:no ?Edema:no ?Short of breath: not unless sig exertion as expected and not getting worse ? ?Elevated Cholesterol: ?Using medications without problems:yes ?Muscle aches: not from statin, d/w pt.  Occ positional R abd wall discomfort.   ?Diet compliance: yes ?Exercise: yes ? ?B12.  Still on replacement.  No ADE on replacement, compliant.   ? ?Hypothyroidism.  TSH slightly low.  I told him I would check with endocrinology about this.  I did not want to change his medication yet. ? ?Discussed with patient about previous pancreatic imaging.   ?A cystic lesion of the anterior aspect of the pancreatic uncinate ?is further diminished in size compared to prior examination, ?measuring no greater than 0.5 cm. An additional cystic lesion of the ?pancreatic tail is unchanged, measuring no greater than 0.4 cm. As ?there is no observed increased risk of malignancy for such lesions ?smaller than 2 cm, and these lesions in particular have demonstrated ?stable or diminished size over multiple prior examinations dating ?back to 2018, no further follow-up or characterization is required. ? ?PMH and SH reviewed ? ?Meds, vitals, and allergies reviewed.  ? ?ROS: Per HPI.  Unless specifically indicated otherwise in HPI, the patient denies: ? ?General: fever. ?Eyes: acute vision changes ?ENT: sore throat ?Cardiovascular: chest pain ?Respiratory: SOB ?GI: vomiting ?GU: dysuria ?Musculoskeletal: acute back pain ?Derm: acute rash ?Neuro: acute motor dysfunction ?Psych: worsening mood ?Endocrine: polydipsia ?Heme: bleeding ?Allergy: hayfever ? ?GEN: nad, alert and oriented ?HEENT: ncat ?NECK: supple w/o LA ?CV: rrr. ?PULM: ctab, no inc wob ?ABD: soft, +bs ?EXT: no edema ?SKIN: no acute rash ?

## 2022-04-24 NOTE — Patient Instructions (Signed)
We'll call about seeing GI.   ?Let me check with endo about follow up TSH/A1c/ferritin.  ?We'll go from there.  ?Take care.  Glad to see you. ?I would get a flu shot each fall.   ?Keep going with B12 shots.   ?

## 2022-04-25 DIAGNOSIS — Z0000 Encounter for general adult medical examination without abnormal findings: Secondary | ICD-10-CM | POA: Insufficient documentation

## 2022-04-25 NOTE — Assessment & Plan Note (Signed)
Discussed with patient about previous imaging. As there is no observed increased risk of malignancy for such lesions smaller than 2 cm, and these lesions in particular have demonstrated stable or diminished size over multiple prior examinations dating back to 2018, no further follow-up or characterization is required. ?

## 2022-04-25 NOTE — Assessment & Plan Note (Signed)
TSH slightly low.  I told him I would check with endocrinology about this.  I did not want to change his medication yet. ?

## 2022-04-25 NOTE — Assessment & Plan Note (Signed)
Eliminated red meat, labs d/w pt. No recent phlebotomy/need.  Labs discussed with patient.  We can recheck his ferritin later on. ?

## 2022-04-25 NOTE — Assessment & Plan Note (Signed)
Still on replacement.  No ADE on replacement, compliant.   Continue as is. ?

## 2022-04-25 NOTE — Assessment & Plan Note (Signed)
Continue work on diet and exercise.  Continue fenofibrate and pravastatin. ?

## 2022-04-25 NOTE — Assessment & Plan Note (Signed)
Continue lisinopril

## 2022-04-25 NOTE — Assessment & Plan Note (Signed)
Previously per endocrinology not told patient I would check with that clinic about establishing follow-up care there. ?

## 2022-04-25 NOTE — Assessment & Plan Note (Signed)
Flu previously ?Shingles previously done ?PNA up-to-date ?Tetanus 2019 ?COVID-vaccine previously done. ?Colonoscopy-referred 2023 ?Prostate cancer screening 2020 ?Advance directive-wife designated patient were incapacitated ?Cognitive function addressed- see scanned forms- and if abnormal then additional documentation follows.  ?

## 2022-04-25 NOTE — Assessment & Plan Note (Signed)
Wife designated if patient were incapacitated.  

## 2022-04-29 ENCOUNTER — Telehealth: Payer: Self-pay | Admitting: Family Medicine

## 2022-04-29 DIAGNOSIS — E0500 Thyrotoxicosis with diffuse goiter without thyrotoxic crisis or storm: Secondary | ICD-10-CM

## 2022-04-29 DIAGNOSIS — E10649 Type 1 diabetes mellitus with hypoglycemia without coma: Secondary | ICD-10-CM

## 2022-04-29 NOTE — Telephone Encounter (Signed)
This patient previously saw Dr. Loanne Drilling and I need to see about options for follow-up at your clinic.  Who can he see at your clinic and when can he schedule follow-up? His TSH is slightly low.  I did not yet address this. I can address his TSH and recheck his ferritin and A1c here at clinic.  I can also refill his medicines in the meantime, if needed.    The main thing I need is to establish is a plan so I can update the patient.  Please let me know so I can make plans about his levothyroxine, other refills, A1c and ferritin.  And I greatly appreciate your help.  Take care.

## 2022-04-30 NOTE — Telephone Encounter (Signed)
Hi Shaw, We are trying to rotate most of Ell[facility]'s patients between ourselves and the [facility] endocrinology office.  We have another endocrinologist coming in November.  Until then, we are trying her best to see whoever has more acute concerns.  For now, in this case, I would suggest to recheck his TSH and if still low, to back off his levothyroxine from 150 to 137 mcg daily.  I will send his name to the schedulers to try to have him back in ~ 1.5 months and at that time we will recheck the thyroid tests Evelena Peat and Kristeen Miss - please see above). Sincerely, Salena Saner

## 2022-05-01 ENCOUNTER — Ambulatory Visit: Payer: Medicare Other

## 2022-05-02 NOTE — Telephone Encounter (Signed)
Duly noted.  I greatly appreciate the help of all involved.   Patient is scheduled for 06/13/22 with Dr Dwyane Dee.   I routed this to Dr. Dwyane Dee as Juluis Rainier, with appreciation. I put in follow-up orders for A1c TSH and ferritin, so hopefully all of those can be collected at the same time.  Janett Billow- please update patient.  If he needs refills in the meantime then let me know.  Thanks.

## 2022-05-04 NOTE — Telephone Encounter (Signed)
Spoke to pt. He said he is good on meds right now.

## 2022-05-13 IMAGING — MR MR ABDOMEN WO/W CM
18 series · 48 of 48 positions shown · IV contrast (16ml Multihance)
Comparison: MRI 06/17/2019

CLINICAL DATA: Pancreatic cyst or pseudocyst.

EXAM:
MRI ABDOMEN WITHOUT AND WITH CONTRAST
TECHNIQUE: Multiplanar multisequence MR imaging of the abdomen was performed
both before and after the administration of intravenous contrast.
CONTRAST:  20mL MULTIHANCE GADOBENATE DIMEGLUMINE 529 MG/ML IV SOLN

[Series 3: T1 · axial · 3.0mm · 1.32mm/px · z∈[-53,+232]mm · 6 of 192 slices shown]
[im 1/192]
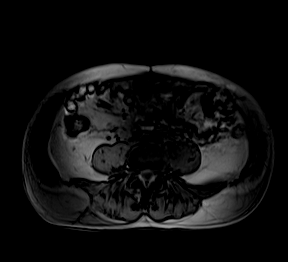
[im 39/192]
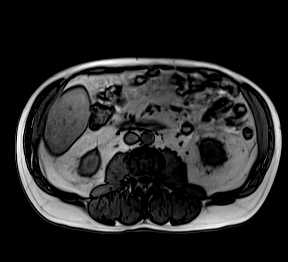
[im 77/192]
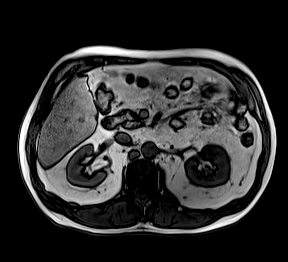
[im 115/192]
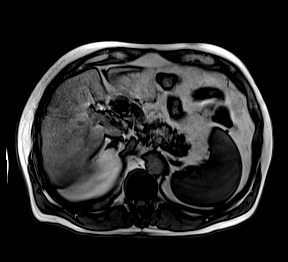
[im 153/192]
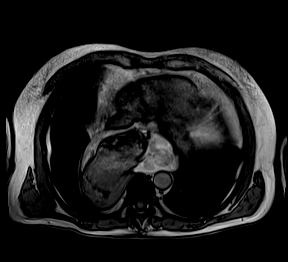
[im 192/192]
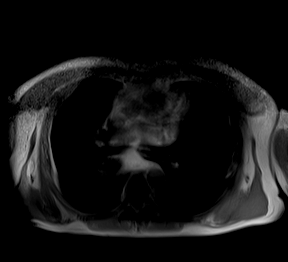

[Series 5: DWI · axial · 5.0mm · 1.42mm/px · z∈[-75,+189]mm · 5 of 135 slices shown (1 of 2)]
[im 1/135]
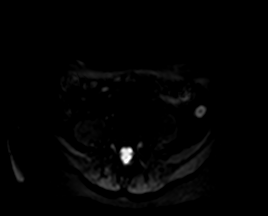
[im 34/135]
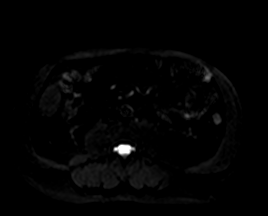
[im 68/135]
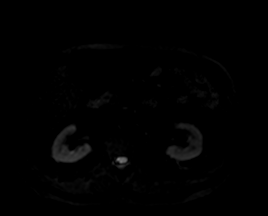
[im 101/135]
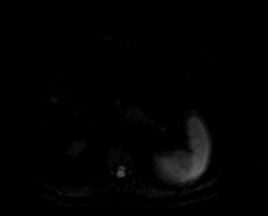
[im 135/135]
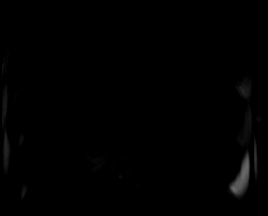

[Series 6: DWI · axial · 5.0mm · 1.42mm/px · z∈[-75,+189]mm · 2 of 45 slices shown (2 of 2)]
[im 1/45]
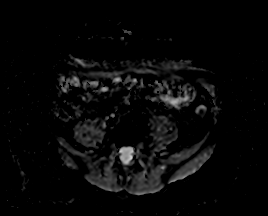
[im 45/45]
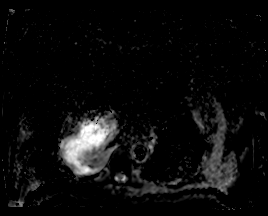

[Series 7: T2 · axial · 5.0mm · 1.48mm/px · 1 of 44 slices shown (1 of 4)]
[im 1/44]
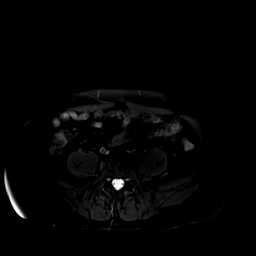

[Series 10: T2 · axial · 6.0mm · 1.19mm/px · 1 of 38 slices shown (2 of 4)]
[im 1/38]
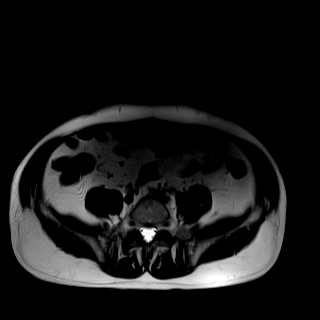

[Series 11: bSSFP · axial · 5.0mm · 1.39mm/px · 1 of 43 slices shown]
[im 1/43]
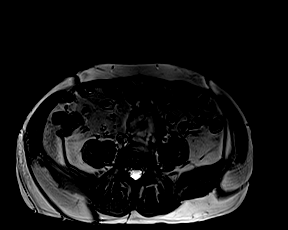

[Series 15: T2 · coronal · 3.0mm · 1.48mm/px · 1 of 23 slices shown (3 of 4)]
[im 1/23]
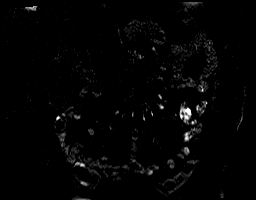

[Series 17: T2 · coronal · 3.0mm · 1.48mm/px · 1 of 23 slices shown (4 of 4)]
[im 1/23]
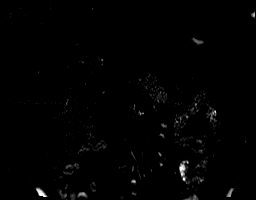

[Series 18: T1 dynamic · axial · non-contrast · 3.0mm · 1.25mm/px · z∈[-61,+200]mm · 3 of 88 slices shown]
[im 1/88]
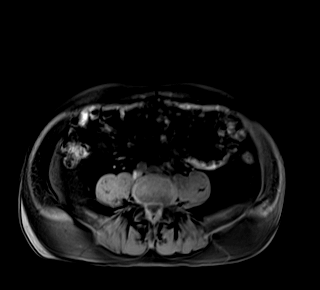
[im 44/88]
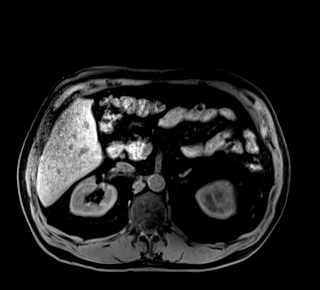
[im 88/88]
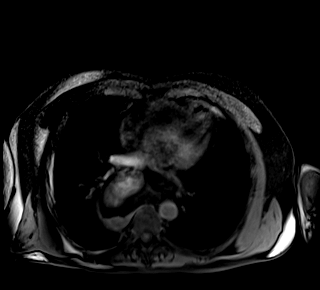

[Series 19: T1 dynamic post-contrast · axial · 3.0mm · 1.25mm/px · z∈[-76,+185]mm · 3 of 88 slices shown (1 of 9)]
[im 1/88]
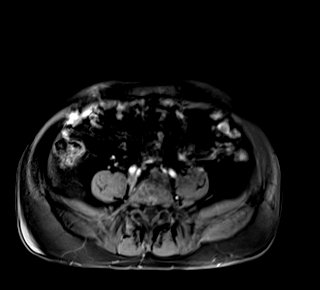
[im 44/88]
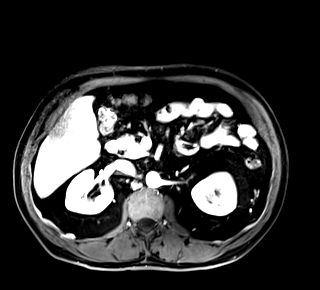
[im 88/88]
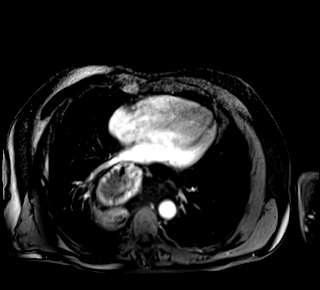

[Series 20: T1 dynamic post-contrast · axial · 3.0mm · 1.25mm/px · z∈[-76,+185]mm · 3 of 88 slices shown (2 of 9)]
[im 1/88]
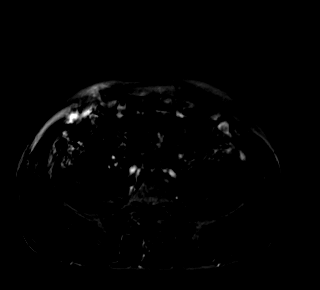
[im 44/88]
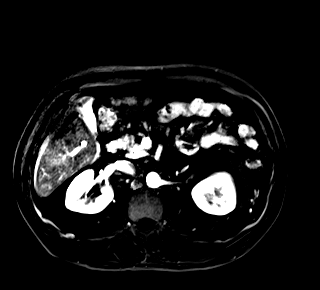
[im 88/88]
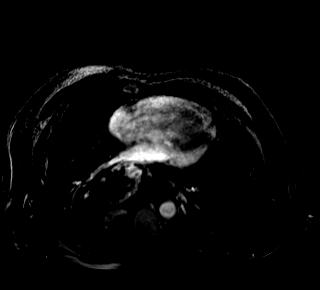

[Series 21: T1 dynamic post-contrast · axial · 3.0mm · 1.25mm/px · z∈[-76,+185]mm · 3 of 88 slices shown (3 of 9)]
[im 1/88]
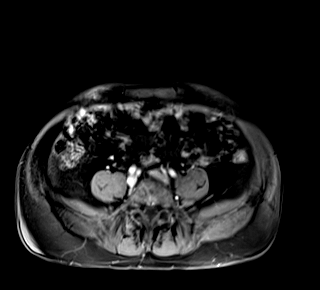
[im 44/88]
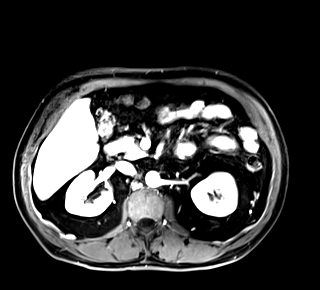
[im 88/88]
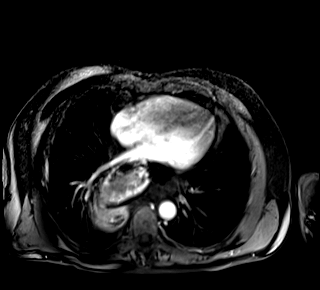

[Series 22: T1 dynamic post-contrast · axial · 3.0mm · 1.25mm/px · z∈[-76,+185]mm · 3 of 88 slices shown (4 of 9)]
[im 1/88]
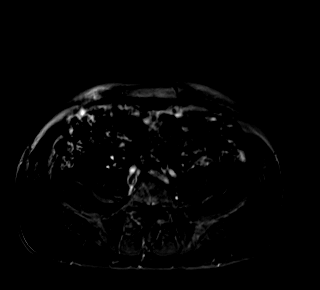
[im 44/88]
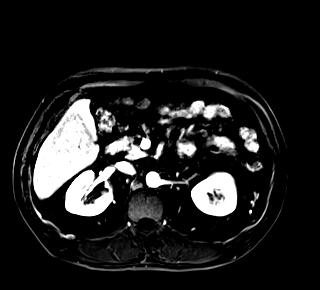
[im 88/88]
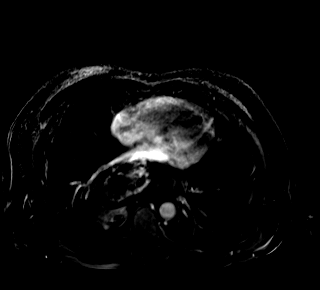

[Series 23: T1 dynamic post-contrast · axial · 3.0mm · 1.25mm/px · z∈[-76,+185]mm · 3 of 88 slices shown (5 of 9)]
[im 1/88]
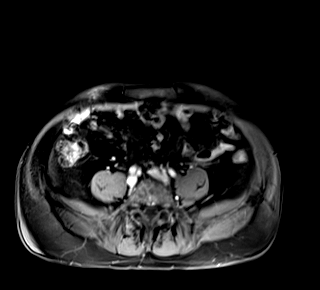
[im 44/88]
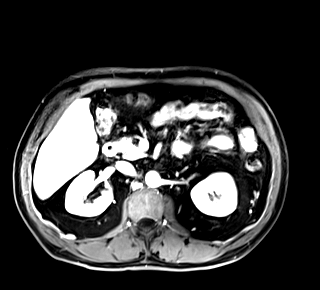
[im 88/88]
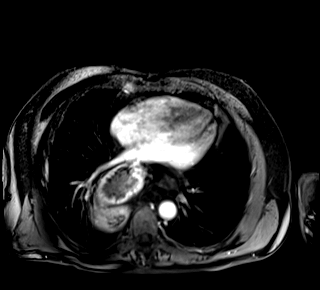

[Series 24: T1 dynamic post-contrast · axial · 3.0mm · 1.25mm/px · z∈[-76,+185]mm · 3 of 88 slices shown (6 of 9)]
[im 1/88]
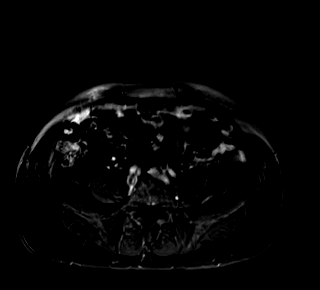
[im 44/88]
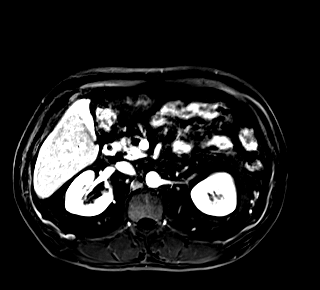
[im 88/88]
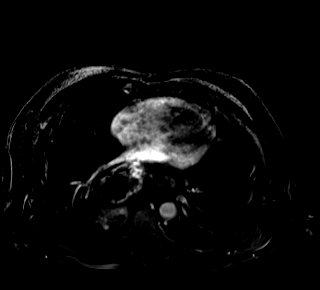

[Series 25: T1 dynamic post-contrast · coronal · 3.0mm · 1.25mm/px · 3 of 96 slices shown (7 of 9)]
[im 1/96]
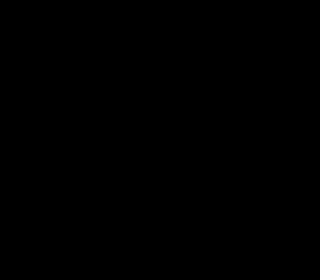
[im 48/96]
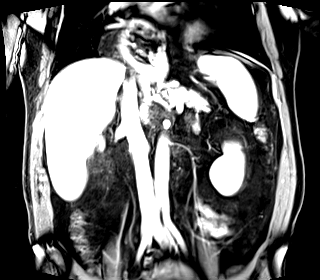
[im 96/96]
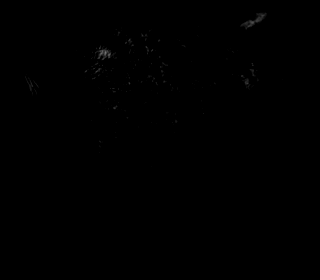

[Series 26: T1 dynamic post-contrast · axial · 3.0mm · 1.25mm/px · z∈[-76,+185]mm · 3 of 88 slices shown (8 of 9)]
[im 1/88]
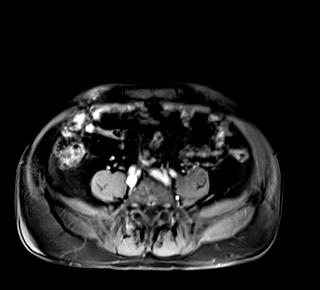
[im 44/88]
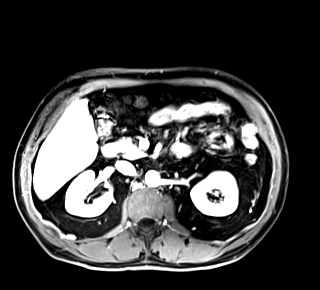
[im 88/88]
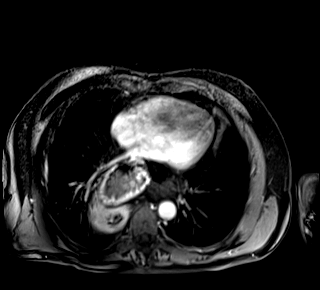

[Series 27: T1 dynamic post-contrast · axial · 3.0mm · 1.25mm/px · z∈[-76,+185]mm · 3 of 88 slices shown (9 of 9)]
[im 1/88]
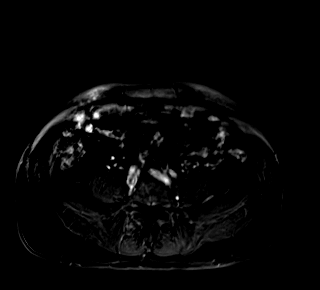
[im 44/88]
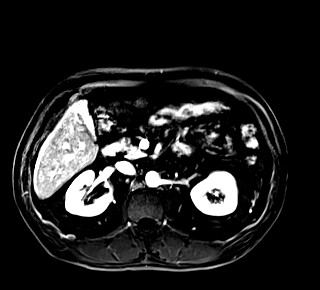
[im 88/88]
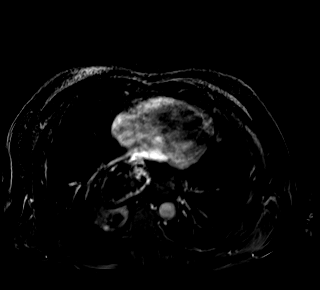

[48 of 48 positions shown; findings below may reference images not displayed]

FINDINGS: Lower chest: Large hiatal hernia with approximately 75% of the
stomach in the chest.

Hepatobiliary: 13 mm T2 hyperintensity in the inferior right liver
(segment V) is stable in size since 06/17/2019, suggesting benign
etiology. This is not well demonstrated on postcontrast imaging due
to motion artifact through this region of the liver. Gallbladder is
surgically absent. No intrahepatic or extrahepatic biliary dilation.

Pancreas: The 8 x 8 x 19 mm cystic lesion seen along the anterior
aspect of the uncinate process previously has decreased in the
interval, now measuring 3 x 5 x 11 mm. 4 mm unilocular cystic focus
identified in the tail of pancreas (T2 image 11/series 7), also
unchanged in the interval. No dilatation of the main duct.

Spleen:  No splenomegaly. No focal mass lesion.

Adrenals/Urinary Tract: No adrenal nodule or mass. No suspicious
abnormality in either kidney.

Stomach/Bowel: As above, there is a large hiatal hernia. Duodenum is
normally positioned as is the ligament of Treitz. Duodenal
diverticulum noted. No small bowel or colonic dilatation within the
visualized abdomen.

Vascular/Lymphatic: No abdominal aortic aneurysm. No abdominal
lymphadenopathy.

Other:  No intraperitoneal free fluid.

Musculoskeletal: No focal suspicious marrow enhancement within the
visualized bony anatomy.
IMPRESSION: 1. Interval decrease in the size of the previously described cystic
lesion in the uncinate process of the pancreas, reassuring. This
lesion now peers very similar to the 04/23/2017 exam, providing 3
years of stable imaging follow-up. A second tiny 4 mm cystic focus
in the tail of pancreas is also unchanged. Consensus guidelines
recommend that for cystic lesions of this size in a patient of this
age, follow-up imaging can be formed annually for the first 5 years.
As such, follow-up MRI in 12 months recommended. This recommendation
follows ACR consensus guidelines: Management of Incidental
Pancreatic Cysts: A White Paper of the ACR Incidental Findings
Committee. [HOSPITAL] 2278;[DATE].
2. Large hiatal hernia.
3. 13 mm T2 hyperintensity in the inferior right liver is stable in
size since 06/17/2019 and also comparing back to 04/23/2017, likely
benign. Attention on follow-up recommended.

## 2022-05-16 ENCOUNTER — Ambulatory Visit (INDEPENDENT_AMBULATORY_CARE_PROVIDER_SITE_OTHER): Payer: Medicare Other

## 2022-05-16 DIAGNOSIS — E538 Deficiency of other specified B group vitamins: Secondary | ICD-10-CM | POA: Diagnosis not present

## 2022-05-16 MED ORDER — CYANOCOBALAMIN 1000 MCG/ML IJ SOLN
1000.0000 ug | Freq: Once | INTRAMUSCULAR | Status: AC
  Administered 2022-05-16: 1000 ug via INTRAMUSCULAR

## 2022-05-16 NOTE — Progress Notes (Signed)
Patient presented for B 12 injection given by Laszlo Ellerby, CMA to right deltoid, patient voiced no concerns nor showed any signs of distress during injection.  

## 2022-05-30 ENCOUNTER — Other Ambulatory Visit: Payer: Self-pay

## 2022-05-30 MED ORDER — LANTUS SOLOSTAR 100 UNIT/ML ~~LOC~~ SOPN: 26.0000 [IU] | PEN_INJECTOR | Freq: Every day | SUBCUTANEOUS | 99 refills | Status: DC

## 2022-06-05 ENCOUNTER — Ambulatory Visit: Payer: Medicare Other

## 2022-06-13 ENCOUNTER — Encounter: Payer: Self-pay | Admitting: Endocrinology

## 2022-06-13 ENCOUNTER — Ambulatory Visit: Payer: Medicare Other | Admitting: Endocrinology

## 2022-06-13 ENCOUNTER — Other Ambulatory Visit: Payer: Self-pay

## 2022-06-13 VITALS — BP 110/74 | HR 63 | Ht 68.0 in | Wt 172.6 lb

## 2022-06-13 DIAGNOSIS — E10649 Type 1 diabetes mellitus with hypoglycemia without coma: Secondary | ICD-10-CM | POA: Diagnosis not present

## 2022-06-13 DIAGNOSIS — E1065 Type 1 diabetes mellitus with hyperglycemia: Secondary | ICD-10-CM

## 2022-06-13 DIAGNOSIS — E063 Autoimmune thyroiditis: Secondary | ICD-10-CM | POA: Diagnosis not present

## 2022-06-13 LAB — POCT GLYCOSYLATED HEMOGLOBIN (HGB A1C): Hemoglobin A1C: 7.1 % — AB (ref 4.0–5.6)

## 2022-06-13 LAB — MICROALBUMIN / CREATININE URINE RATIO
Creatinine,U: 94.7 mg/dL
Microalb Creat Ratio: 0.7 mg/g (ref 0.0–30.0)
Microalb, Ur: <0.7 mg/dL (ref 0.0–1.9)

## 2022-06-13 MED ORDER — INSULIN LISPRO (1 UNIT DIAL) 100 UNIT/ML (KWIKPEN): PEN_INJECTOR | SUBCUTANEOUS | 3 refills | Status: DC

## 2022-06-13 NOTE — Patient Instructions (Addendum)
Am dose 18 Lunch dose 20-22 units  Dinner 16-18

## 2022-06-13 NOTE — Progress Notes (Addendum)
Patient ID: Ryan Crane, male   DOB: 04-Aug-1956, 66 y.o.   MRN: [id number]            Reason for Appointment: Type II Diabetes follow-up   History of Present Illness   Diagnosis date: 2013  Previous history:  Non-insulin hypoglycemic drugs previously used: Glipizide, metformin Insulin was started in 2019  A1c range in the last few years is: 5.9-10.1  Recent history:     Non-insulin hypoglycemic drugs: None      Insulin regimen: Humalog 16-18-18 ac , Lantus 26 units daily    Side effects from medications: None  Current self management, blood sugar patterns and problems identified:  A1c is 7.1 and unchanged He tends to have higher readings after meals but inconsistent Previous CGM showed only 45% within target range Blood sugars may at times get low either before or after lunch   Exercise: some walking, golf  Hypoglycemia: Occasionally He will periodically get low blood sugars with symptoms of numbness in the lips and dizziness  Glucometer: One Touch Verio.           Blood Glucose readings over the last couple of weeks averaging 170   Dietician visit: Most recent:    None   Weight control:  Wt Readings from Last 3 Encounters:  06/13/22 172 lb 9.6 oz (78.3 kg)  04/24/22 171 lb (77.6 kg)  03/09/22 176 lb 6 oz (80 kg)            Diabetes labs:  Lab Results  Component Value Date   HGBA1C 7.1 (A) 06/13/2022   HGBA1C 7.1 (A) 03/05/2022   HGBA1C 7.1 (A) 11/06/2021   Lab Results  Component Value Date   LDLCALC 92 04/17/2022   CREATININE 1.19 04/17/2022     Allergies as of 06/13/2022       Reactions   Flomax [tamsulosin Hcl]    nausea        Medication List        Accurate as of June 13, 2022 10:47 AM. If you have any questions, ask your nurse or doctor.          Accu-Chek Guide Me w/Device Kit 1 each by Does not apply route 4 (four) times daily. E11.9   Accu-Chek Softclix Lancets lancets 1 each by Other route 4 (four) times daily.  X03.8   OneTouch Delica Lancets 33X Misc Use to check BS 7x a day   OneTouch Delica Plus OVANVB16O Misc USE TO TEST BLOOD SUGAR 7 TIMES DAILY   albuterol 108 (90 Base) MCG/ACT inhaler Commonly known as: VENTOLIN HFA Inhale 2 puffs into the lungs every 6 (six) hours as needed for wheezing or shortness of breath.   aspirin 81 MG tablet Take 81 mg by mouth daily.   BD Pen Needle Nano 2nd Gen 32G X 4 MM Misc Generic drug: Insulin Pen Needle USE 4 TIMES A DAY   cyanocobalamin 1000 MCG/ML injection Commonly known as: (VITAMIN B-12) Inject 1,000 mcg into the muscle every 30 (thirty) days.   diclofenac sodium 1 % Gel Commonly known as: VOLTAREN Apply 2 g topically 4 (four) times daily as needed.   fenofibrate 160 MG tablet Take 1 tablet (160 mg total) by mouth daily.   fluticasone 50 MCG/ACT nasal spray Commonly known as: FLONASE Place 1 spray into both nostrils daily.   insulin lispro 100 UNIT/ML KwikPen Commonly known as: HumaLOG KwikPen 3 times a day (just before each meal) 16-18-18 units   Insulin Syringe-Needle U-100 30G  X 1/2" 0.5 ML Misc 1 each by Does not apply route daily. E11.9   Lantus SoloStar 100 UNIT/ML Solostar Pen Generic drug: insulin glargine Inject 26 Units into the skin daily.   levothyroxine 150 MCG tablet Commonly known as: SYNTHROID Take 1 tablet (150 mcg total) by mouth daily.   lisinopril 20 MG tablet Commonly known as: ZESTRIL Take 1 tablet (20 mg total) by mouth daily.   omeprazole 40 MG capsule Commonly known as: PRILOSEC Take 1 capsule (40 mg total) by mouth daily.   OneTouch Verio test strip Generic drug: glucose blood USE TO TEST BLOOD SUGARS AS DIRECTED 7 TIMES A DAY.   oxyCODONE-acetaminophen 5-325 MG tablet Commonly known as: PERCOCET/ROXICET Take 1-2 tablets by mouth every 6 (six) hours as needed (for kidney stones).   pravastatin 10 MG tablet Commonly known as: PRAVACHOL TAKE 1 TABLET BY MOUTH EVERYDAY AT BEDTIME         Allergies:  Allergies  Allergen Reactions   Flomax [Tamsulosin Hcl]     nausea    Past Medical History:  Diagnosis Date   Allergy    Arthritis    Cholelithiasis    symptomatic   Diabetes mellitus ([place])    Family history of malignant neoplasm of gastrointestinal tract    Fatty liver    GERD (gastroesophageal reflux disease)    Graves disease    s/p ablation   Hemochromatosis    History of chicken pox    History of hiatal hernia    History of kidney stones    Hyperlipidemia    Hypertension    Internal hemorrhoids    Kidney stones     Personal history of colonic polyps 03/31/2008   tubular adenoma   Right fibular fracture 2013    Past Surgical History:  Procedure Laterality Date   CHOLECYSTECTOMY N/A 05/29/2017   Procedure: LAPAROSCOPIC CHOLECYSTECTOMY;  Surgeon: Clovis Riley, MD;  Location: [facility] OR;  Service: General;  Laterality: N/A;   COLONOSCOPY W/ BIOPSIES AND POLYPECTOMY     LITHOTRIPSY     URETERAL STENT PLACEMENT     left    Family History  Problem Relation Age of Onset   COPD Mother    Arthritis Mother    Diabetes Mother    Hypertension Mother    Lung cancer Father    Colon cancer Maternal Grandmother 53   Prostate cancer Neg Hx    Esophageal cancer Neg Hx    Rectal cancer Neg Hx    Stomach cancer Neg Hx    Thyroid disease Neg Hx     Social History:  reports that he has never smoked. He has never used smokeless tobacco. He reports that he does not drink alcohol and does not use drugs.  Review of Systems:  Last diabetic eye exam date 8/22  Last foot exam date: 7/22  Symptoms of neuropathy: None  Hypertension:   Treatment includes lisinopril 20 mg daily  BP Readings from Last 3 Encounters:  06/13/22 110/74  04/24/22 108/60  03/09/22 132/64   No history of renal dysfunction or hyperkalemia  Lab Results  Component Value Date   CREATININE 1.19 04/17/2022   BUN 16 04/17/2022   NA 137 04/17/2022   K 4.7 04/17/2022   CL 100  04/17/2022   CO2 28 04/17/2022     Lipid management: Has been on pravastatin 10 mg daily from his PCP Also has been on fenofibrate 160 mg, previously had mild increase in triglycerides    Lab Results  Component Value Date   CHOL 153 04/17/2022   CHOL 151 06/30/2021   CHOL 189 04/17/2021   Lab Results  Component Value Date   HDL 35.70 (L) 04/17/2022   HDL 52.40 06/30/2021   HDL 40.20 04/17/2021   Lab Results  Component Value Date   LDLCALC 92 04/17/2022   LDLCALC 84 06/30/2021   LDLCALC 118 (H) 04/17/2021   Lab Results  Component Value Date   TRIG 124.0 04/17/2022   TRIG 73.0 06/30/2021   TRIG 154.0 (H) 04/17/2021   Lab Results  Component Value Date   CHOLHDL 4 04/17/2022   CHOLHDL 3 06/30/2021   CHOLHDL 5 04/17/2021   Lab Results  Component Value Date   LDLDIRECT 120.8 01/16/2011   HYPOTHYROIDISM followed by his PCP Taking 150 mcg levothyroxine   Lab Results  Component Value Date   TSH 0.27 (L) 04/17/2022   TSH 0.65 06/30/2021   TSH 0.18 (L) 04/17/2021     Examination:   BP 110/74   Pulse 63   Ht $R'5\' 8"'GD$  (1.727 m)   Wt 172 lb 9.6 oz (78.3 kg)   SpO2 94%   BMI 26.24 kg/m   Body mass index is 26.24 kg/m.    ASSESSMENT/ PLAN:    Diabetes type 2 insulin-dependent:   Current regimen: Basal bolus insulin with Lantus and Humalog  See history of present illness for detailed discussion of current diabetes management, blood sugar patterns and problems identified  A1c is 7.1 and lower than expected of his average blood sugar of 171 at home  Blood glucose control is suboptimal with marked fluctuation in his blood sugars and only 45% within target range on his glucose monitoring Part of the fluctuation is related to inconsistent effect of Lantus which may be causing low sugars early part of the night and not lasting 24 hours Also likely not getting enough insulin to cover his lunch as seen on his current blood sugar patterns Currently does not have any  hypoglycemia unawareness including at night  Recommendations: Although he refuses to consider continuous glucose monitoring he is a good candidate for an insulin pump Discussed potential advantages of a closed-loop insulin pump system to reduce glucose variability, keep his blood sugars more in target range and avoid hypoglycemia Meanwhile he needs a better more evenly acting 24-hour insulin for basal insulin needs and Lantus is providing only inconsistent control with more hypoglycemia also He was started with TRESIBA 26 units daily Given him flowsheet to adjust it by 2 units every 3 days to keep morning sugars fairly consistently within 90-130 target range He does need to start adjusting his insulin doses based on his total carbohydrate intake, meal size and also plan and activity For now he will go up to 18 units for breakfast and at least 20 units at lunch Likely needs to reduce his evening dinner dose to 16 units For most of his meals he will need to adjust 2 to 4 units based on his meal size and use the lower end of the recommended dose on the weekends when he is more active Referral made for diabetes education with nurse educator and he will also discuss possibility of going on a closed-loop insulin pump with her Needs urine microalbumin checked  HYPERLIPIDEMIA: Managed by his PCP and currently on a low-dose statin and fenofibrate with adequate control  HYPOTHYROIDISM: To be followed by PCP  Patient Instructions  Am dose 18 Lunch dose 20-22 units  Dinner 16-18  Elayne Snare 06/13/2022, 10:47 AM

## 2022-06-20 ENCOUNTER — Ambulatory Visit (INDEPENDENT_AMBULATORY_CARE_PROVIDER_SITE_OTHER): Payer: Medicare Other | Admitting: *Deleted

## 2022-06-20 ENCOUNTER — Other Ambulatory Visit: Payer: Self-pay

## 2022-06-20 DIAGNOSIS — E10649 Type 1 diabetes mellitus with hypoglycemia without coma: Secondary | ICD-10-CM

## 2022-06-20 DIAGNOSIS — E538 Deficiency of other specified B group vitamins: Secondary | ICD-10-CM

## 2022-06-20 MED ORDER — CYANOCOBALAMIN 1000 MCG/ML IJ SOLN
1000.0000 ug | Freq: Once | INTRAMUSCULAR | Status: AC
  Administered 2022-06-20: 1000 ug via INTRAMUSCULAR

## 2022-06-20 MED ORDER — TRESIBA FLEXTOUCH 100 UNIT/ML ~~LOC~~ SOPN: PEN_INJECTOR | SUBCUTANEOUS | 2 refills | Status: DC

## 2022-06-20 MED ORDER — BD PEN NEEDLE NANO 2ND GEN 32G X 4 MM MISC: 2 refills | Status: DC

## 2022-06-20 NOTE — Progress Notes (Signed)
Per orders of Dr. Gutierrez, injection of Vitamin B-12 given by Jaryd Drew. Patient tolerated injection well. 

## 2022-07-03 ENCOUNTER — Ambulatory Visit: Payer: Medicare Other

## 2022-07-25 ENCOUNTER — Ambulatory Visit: Payer: Medicare Other

## 2022-07-26 ENCOUNTER — Ambulatory Visit: Payer: Medicare Other

## 2022-07-27 ENCOUNTER — Ambulatory Visit (INDEPENDENT_AMBULATORY_CARE_PROVIDER_SITE_OTHER): Payer: Medicare Other

## 2022-07-27 DIAGNOSIS — E538 Deficiency of other specified B group vitamins: Secondary | ICD-10-CM

## 2022-07-27 MED ORDER — CYANOCOBALAMIN 1000 MCG/ML IJ SOLN
1000.0000 ug | Freq: Once | INTRAMUSCULAR | Status: AC
  Administered 2022-07-27: 1000 ug via INTRAMUSCULAR

## 2022-07-27 NOTE — Progress Notes (Signed)
Per orders of Dr. Graham Duncan, injection of Vitamin B 12 given by Sharnee Douglass. Patient tolerated injection well.  

## 2022-08-06 DIAGNOSIS — H524 Presbyopia: Secondary | ICD-10-CM | POA: Diagnosis not present

## 2022-08-06 DIAGNOSIS — H2513 Age-related nuclear cataract, bilateral: Secondary | ICD-10-CM | POA: Diagnosis not present

## 2022-08-06 DIAGNOSIS — H53143 Visual discomfort, bilateral: Secondary | ICD-10-CM | POA: Diagnosis not present

## 2022-08-06 DIAGNOSIS — H35033 Hypertensive retinopathy, bilateral: Secondary | ICD-10-CM | POA: Diagnosis not present

## 2022-08-29 ENCOUNTER — Ambulatory Visit (INDEPENDENT_AMBULATORY_CARE_PROVIDER_SITE_OTHER): Payer: Medicare Other | Admitting: *Deleted

## 2022-08-29 DIAGNOSIS — E538 Deficiency of other specified B group vitamins: Secondary | ICD-10-CM | POA: Diagnosis not present

## 2022-08-29 MED ORDER — CYANOCOBALAMIN 1000 MCG/ML IJ SOLN
1000.0000 ug | Freq: Once | INTRAMUSCULAR | Status: AC
  Administered 2022-08-29: 1000 ug via INTRAMUSCULAR

## 2022-08-29 NOTE — Progress Notes (Signed)
Per orders of Dr. Gutierrez, injection of Vitamin B-12 given by Susumu Hackler. Patient tolerated injection well. 

## 2022-09-10 ENCOUNTER — Other Ambulatory Visit: Payer: Medicare Other

## 2022-09-10 ENCOUNTER — Other Ambulatory Visit: Payer: Self-pay

## 2022-09-10 DIAGNOSIS — E10649 Type 1 diabetes mellitus with hypoglycemia without coma: Secondary | ICD-10-CM

## 2022-09-10 MED ORDER — LANTUS SOLOSTAR 100 UNIT/ML ~~LOC~~ SOPN: 26.0000 [IU] | PEN_INJECTOR | Freq: Every day | SUBCUTANEOUS | 99 refills | Status: DC

## 2022-09-11 ENCOUNTER — Other Ambulatory Visit (INDEPENDENT_AMBULATORY_CARE_PROVIDER_SITE_OTHER): Payer: Medicare Other

## 2022-09-11 DIAGNOSIS — E1065 Type 1 diabetes mellitus with hyperglycemia: Secondary | ICD-10-CM

## 2022-09-11 DIAGNOSIS — E063 Autoimmune thyroiditis: Secondary | ICD-10-CM | POA: Diagnosis not present

## 2022-09-11 LAB — GLUCOSE, RANDOM: Glucose, Bld: 86 mg/dL (ref 70–99)

## 2022-09-11 LAB — HEMOGLOBIN A1C: Hgb A1c MFr Bld: 6.2 % (ref 4.6–6.5)

## 2022-09-11 LAB — TSH: TSH: 0.26 u[IU]/mL — ABNORMAL LOW (ref 0.35–5.50)

## 2022-09-12 NOTE — Progress Notes (Unsigned)
Patient ID: Ryan Crane, male   DOB: 11/07/1956, 66 y.o.   MRN: [id number]            Reason for Appointment: Type II Diabetes follow-up   History of Present Illness   Diagnosis date: 2013  Previous history:  Non-insulin hypoglycemic drugs previously used: Insulin was started in 2019  A1c range in the last few years is:  Recent history:     Non-insulin hypoglycemic drugs: None     Insulin regimen: Humalog 18 units before breakfast, 22 before lunch and 18 before dinner ac     Tresiba 26 units daily    Side effects from medications: None  Current self management, blood sugar patterns and problems identified:  A1c is 6.2 compared to 7.1  With switching to Tresiba from Lantus his blood sugars appear to be a lot more stable and his overall control is better Fasting readings are more consistent and he has not had any low sugars overnight However his blood sugars after dinner appear to fluctuate significantly He is usually not adjusting his mealtime dose based on what he is eating or his activity level; not able to count carbohydrates currently Blood sugars start trending lower between breakfast and lunch and today in the office he was feeling well and hypoglycemic This is despite having 3 eggs with his toast for breakfast He is not clear what foods make his blood sugar go up especially at night after dinner with occasional readings over 300 He is still not keen on having a CGM for his monitoring  Exercise: some walking, golf, yard work on weekends  Diet management:     Hypoglycemia: As above, symptoms of numbness in the lips, dizziness, treated with glucose tablets  Glucometer: One Touch Verio.            Blood Glucose readings from meter download:   PRE-MEAL Fasting Lunch Dinner Bedtime Overall  Glucose range: 86-185   95-314 47-327  Mean/median: 135 120 124 178 134   POST-MEAL PC Breakfast PC Lunch PC Dinner  Glucose range:     Mean/median: 73      Dietician  visit: Most recent: 2014 for hemochromatosis  Weight control:  Wt Readings from Last 3 Encounters:  09/13/22 175 lb 6.4 oz (79.6 kg)  06/13/22 172 lb 9.6 oz (78.3 kg)  04/24/22 171 lb (77.6 kg)            Diabetes labs:  Lab Results  Component Value Date   HGBA1C 6.2 09/11/2022   HGBA1C 7.1 (A) 06/13/2022   HGBA1C 7.1 (A) 03/05/2022   Lab Results  Component Value Date   MICROALBUR <0.7 06/13/2022   LDLCALC 92 04/17/2022   CREATININE 1.19 04/17/2022     Allergies as of 09/13/2022       Reactions   Flomax [tamsulosin Hcl]    nausea        Medication List        Accurate as of September 13, 2022  8:57 AM. If you have any questions, ask your nurse or doctor.          Accu-Chek Guide Me w/Device Kit 1 each by Does not apply route 4 (four) times daily. E11.9   Accu-Chek Softclix Lancets lancets 1 each by Other route 4 (four) times daily. E11.9   OneTouch Delica Lancets 30G Misc Use to check BS 7x a day   OneTouch Delica Plus Lancet30G Misc USE TO TEST BLOOD SUGAR 7 TIMES DAILY   albuterol 108 (90   Base) MCG/ACT inhaler Commonly known as: VENTOLIN HFA Inhale 2 puffs into the lungs every 6 (six) hours as needed for wheezing or shortness of breath.   aspirin 81 MG tablet Take 81 mg by mouth daily.   BD Pen Needle Nano 2nd Gen 32G X 4 MM Misc Generic drug: Insulin Pen Needle USE 4 TIMES A DAY   cyanocobalamin 1000 MCG/ML injection Commonly known as: VITAMIN B12 Inject 1,000 mcg into the muscle every 30 (thirty) days.   diclofenac sodium 1 % Gel Commonly known as: VOLTAREN Apply 2 g topically 4 (four) times daily as needed.   fenofibrate 160 MG tablet Take 1 tablet (160 mg total) by mouth daily.   fluticasone 50 MCG/ACT nasal spray Commonly known as: FLONASE Place 1 spray into both nostrils daily.   insulin lispro 100 UNIT/ML KwikPen Commonly known as: HumaLOG KwikPen 3 times a day (just before each meal) 16-18-18 units   Insulin Syringe-Needle  U-100 30G X 1/2" 0.5 ML Misc 1 each by Does not apply route daily. E11.9   Lantus SoloStar 100 UNIT/ML Solostar Pen Generic drug: insulin glargine Inject 26 Units into the skin daily.   levothyroxine 150 MCG tablet Commonly known as: SYNTHROID Take 1 tablet (150 mcg total) by mouth daily.   lisinopril 20 MG tablet Commonly known as: ZESTRIL Take 1 tablet (20 mg total) by mouth daily.   omeprazole 40 MG capsule Commonly known as: PRILOSEC Take 1 capsule (40 mg total) by mouth daily.   OneTouch Verio test strip Generic drug: glucose blood USE TO TEST BLOOD SUGARS AS DIRECTED 7 TIMES A DAY.   oxyCODONE-acetaminophen 5-325 MG tablet Commonly known as: PERCOCET/ROXICET Take 1-2 tablets by mouth every 6 (six) hours as needed (for kidney stones).   pravastatin 10 MG tablet Commonly known as: PRAVACHOL TAKE 1 TABLET BY MOUTH EVERYDAY AT BEDTIME   Tresiba FlexTouch 100 UNIT/ML FlexTouch Pen Generic drug: insulin degludec Inject 26 units daily        Allergies:  Allergies  Allergen Reactions   Flomax [Tamsulosin Hcl]     nausea    Past Medical History:  Diagnosis Date   Allergy    Arthritis    Cholelithiasis    symptomatic   Diabetes mellitus (HCC)    Family history of malignant neoplasm of gastrointestinal tract    Fatty liver    GERD (gastroesophageal reflux disease)    Graves disease    s/p ablation   Hemochromatosis    History of chicken pox    History of hiatal hernia    History of kidney stones    Hyperlipidemia    Hypertension    Internal hemorrhoids    Kidney stones     Personal history of colonic polyps 03/31/2008   tubular adenoma   Right fibular fracture 2013    Past Surgical History:  Procedure Laterality Date   CHOLECYSTECTOMY N/A 05/29/2017   Procedure: LAPAROSCOPIC CHOLECYSTECTOMY;  Surgeon: Connor, Chelsea A, MD;  Location: [facility] OR;  Service: General;  Laterality: N/A;   COLONOSCOPY W/ BIOPSIES AND POLYPECTOMY     LITHOTRIPSY     URETERAL  STENT PLACEMENT     left    Family History  Problem Relation Age of Onset   COPD Mother    Arthritis Mother    Diabetes Mother    Hypertension Mother    Lung cancer Father    Colon cancer Maternal Grandmother 75   Prostate cancer Neg Hx    Esophageal cancer Neg Hx      Rectal cancer Neg Hx    Stomach cancer Neg Hx    Thyroid disease Neg Hx     Social History:  reports that he has never smoked. He has never used smokeless tobacco. He reports that he does not drink alcohol and does not use drugs.  Review of Systems:  Last diabetic eye exam date 8/22  Last foot exam date: 7/22  Symptoms of neuropathy: None  Hypertension:   Treatment includes lisinopril 20 mg daily  BP Readings from Last 3 Encounters:  09/13/22 (!) 152/82  06/13/22 110/74  04/24/22 108/60   No history of renal dysfunction or hyperkalemia  Lab Results  Component Value Date   CREATININE 1.19 04/17/2022   BUN 16 04/17/2022   NA 137 04/17/2022   K 4.7 04/17/2022   CL 100 04/17/2022   CO2 28 04/17/2022     Lipid management: Has been on pravastatin 10 mg daily from his PCP Also has been on fenofibrate 160 mg, previously had mild increase in triglycerides    Lab Results  Component Value Date   CHOL 153 04/17/2022   CHOL 151 06/30/2021   CHOL 189 04/17/2021   Lab Results  Component Value Date   HDL 35.70 (L) 04/17/2022   HDL 52.40 06/30/2021   HDL 40.20 04/17/2021   Lab Results  Component Value Date   LDLCALC 92 04/17/2022   LDLCALC 84 06/30/2021   LDLCALC 118 (H) 04/17/2021   Lab Results  Component Value Date   TRIG 124.0 04/17/2022   TRIG 73.0 06/30/2021   TRIG 154.0 (H) 04/17/2021   Lab Results  Component Value Date   CHOLHDL 4 04/17/2022   CHOLHDL 3 06/30/2021   CHOLHDL 5 04/17/2021   Lab Results  Component Value Date   LDLDIRECT 120.8 01/16/2011   HYPOTHYROIDISM followed by his PCP Taking 150 mcg levothyroxine TSH is consistently low now  Lab Results  Component Value  Date   TSH 0.26 (L) 09/11/2022   TSH 0.27 (L) 04/17/2022   TSH 0.65 06/30/2021     Examination:   BP (!) 152/82   Pulse 63   Ht 5' 8" (1.727 m)   Wt 175 lb 6.4 oz (79.6 kg)   SpO2 97%   BMI 26.67 kg/m   Body mass index is 26.67 kg/m.    ASSESSMENT/ PLAN:    Diabetes type 2 insulin-dependent:   Current regimen: Basal bolus insulin with Lantus and Humalog  See history of present illness for detailed discussion of current diabetes management, blood sugar patterns and problems identified  A1c is 6.2 compared to 7.1  With switching from Lantus to Tresiba his blood sugars are much more evenly controlled and also overall control is better However he is not adjusting his mealtime insulin based on activity level or meal size and this is causing more significant variability around lunchtime and bedtime Also has tendency to hypoglycemia late morning at times and before dinner periodically He is not interested in trying a CGM with despite explaining the advantages  Recommendations: Continue Tresiba 26 units Take only 15 units of Humalog for breakfast and adjust to keep readings midmorning and before lunch at least over 90 Adjust lunch dose based on activity level and likely needs only 18 units when more active but then may need to up to 24 units for larger meals or more carbohydrate Similarly he will adjust his evening dose 2 to 4 units higher for larger meals, desserts or higher fat or carbohydrate meals Referral made for diabetes   education with nurse educator for help with meal planning, adjustment of insulin based on carbohydrates and general education He will let us know if he changes his mind about starting CGM  HYPERLIPIDEMIA: Managed by his PCP and currently on a low-dose statin and fenofibrate with adequate control  HYPOTHYROIDISM: TSH was low previously and since no changes were made and TSH is about the same will need to reduce his dose to 137 mcg levothyroxine instead of  150 Follow-up on next visit  Patient Instructions  Take 15 units at breakfast Take 18-22 units    Elayne Snare 09/13/2022, 8:57 AM

## 2022-09-13 ENCOUNTER — Ambulatory Visit: Payer: Medicare Other | Admitting: Endocrinology

## 2022-09-13 ENCOUNTER — Encounter: Payer: Self-pay | Admitting: Endocrinology

## 2022-09-13 VITALS — BP 152/82 | HR 63 | Ht 68.0 in | Wt 175.4 lb

## 2022-09-13 DIAGNOSIS — E1165 Type 2 diabetes mellitus with hyperglycemia: Secondary | ICD-10-CM | POA: Diagnosis not present

## 2022-09-13 DIAGNOSIS — Z794 Long term (current) use of insulin: Secondary | ICD-10-CM

## 2022-09-13 DIAGNOSIS — E063 Autoimmune thyroiditis: Secondary | ICD-10-CM | POA: Diagnosis not present

## 2022-09-13 NOTE — Patient Instructions (Addendum)
Take 15 units at breakfast not 18 Take 18-22 units at lunch based on activity level  137 Synthroid

## 2022-09-18 ENCOUNTER — Other Ambulatory Visit: Payer: Self-pay

## 2022-09-18 DIAGNOSIS — E10649 Type 1 diabetes mellitus with hypoglycemia without coma: Secondary | ICD-10-CM

## 2022-09-18 MED ORDER — ONETOUCH DELICA PLUS LANCET30G MISC: 5 refills | Status: DC

## 2022-09-21 ENCOUNTER — Other Ambulatory Visit: Payer: Self-pay

## 2022-09-21 DIAGNOSIS — E1165 Type 2 diabetes mellitus with hyperglycemia: Secondary | ICD-10-CM

## 2022-09-21 MED ORDER — ONETOUCH DELICA PLUS LANCET30G MISC: 5 refills | Status: DC

## 2022-10-03 ENCOUNTER — Ambulatory Visit (INDEPENDENT_AMBULATORY_CARE_PROVIDER_SITE_OTHER): Payer: Medicare Other

## 2022-10-03 DIAGNOSIS — E538 Deficiency of other specified B group vitamins: Secondary | ICD-10-CM | POA: Diagnosis not present

## 2022-10-03 MED ORDER — CYANOCOBALAMIN 1000 MCG/ML IJ SOLN
1000.0000 ug | Freq: Once | INTRAMUSCULAR | Status: AC
  Administered 2022-10-03: 1000 ug via INTRAMUSCULAR

## 2022-10-03 NOTE — Progress Notes (Signed)
Patient presented for B 12 injection given by Mattalyn Anderegg, CMA to right deltoid, patient voiced no concerns nor showed any signs of distress during injection.  

## 2022-10-04 ENCOUNTER — Telehealth: Payer: Self-pay | Admitting: Family Medicine

## 2022-10-04 NOTE — Telephone Encounter (Signed)
Patient called in stating that he has blood in his urine.  He stated that he did not have any other symptoms. I sent him to be triaged with access nurse. They advised hi to call back to get an appointment to be seen within 24 hrs. I scheduled him for tomorrow 10/05/22 with [place] at 1040.

## 2022-10-04 NOTE — Telephone Encounter (Signed)
Noted will evaluate patient in office.

## 2022-10-04 NOTE — Telephone Encounter (Signed)
Pt has appointment with Catalina Antigua, NP tomorrow 10/05/22 @ 10:40a. Routed to Dr. Damita Dunnings and Romilda Garret, NP.   [facility] - Client TELEPHONE ADVICE RECORD [facility] Patient Name: Ryan Crane Gender: Male DOB: [birth year] Age: 66 Y 23 D Return Phone Number: [phone] (Primary) Address: City/ State/ Zip: [place] [address] Client [facility] - Client Client Site [facility] - Day Provider Renford Dills - MD Contact Type Call Who Is Calling Patient / Member / Family / Caregiver Call Type Triage / Clinical Relationship To Patient Self Return Phone Number [phone] (Primary) Chief Complaint Urine, Blood In Reason for Call Symptomatic / Request for [place] states she is calling from the office with a pt on the line. Caller states pt states when he went to urinate this morning he had blood in his urine. Translation No Nurse Assessment Nurse: [place], RN, Estill Bamberg Date/Time (Eastern Time): 10/04/2022 8:33:24 AM Confirm and document reason for call. If symptomatic, describe symptoms. ---caller states he had blood in urine. small drop. happened 2x this morning out of the 5x he has went today. Does the patient have any new or worsening symptoms? ---Yes Will a triage be completed? ---Yes Related visit to physician within the last 2 weeks? ---No Does the PT have any chronic conditions? (i.e. diabetes, asthma, this includes High risk factors for pregnancy, etc.) ---Yes Is this a behavioral health or substance abuse call? ---No Guidelines Guideline Title Affirmed Question Affirmed Notes Nurse Date/Time (Eastern Time) Urine - Blood In Blood in urine (Exception: Could be normal menstrual bleeding.) Hum[place]et, RN, Estill Bamberg 10/04/2022 8:35:21 AM Disp. Time Eilene Ghazi Time) Disposition Final User 10/04/2022 8:37:47 AM See PCP within 24 Hours Yes Humfleet, RN,  Estill Bamberg Final Disposition 10/04/2022 8:37:47 AM See PCP within 24 Hours Yes Humfleet, RN, Estill Bamberg PLEASE NOTE: All timestamps contained within this report are represented as [place] Standard Time. CONFIDENTIALTY NOTICE: This fax transmission is intended only for the addressee. It contains information that is legally privileged, confidential or otherwise protected from use or disclosure. If you are not the intended recipient, you are strictly prohibited from reviewing, disclosing, copying using or disseminating any of this information or taking any action in reliance on or regarding this information. If you have received this fax in error, please notify us immediately by telephone so that we can arrange for its return to [place]. Phone: [phone], Toll-Free: [phone], Fax: [phone] Page: 2 of 2 Call Id: [id number] [place] Disagree/Comply Comply Caller Understands Yes PreDisposition InappropriateToAsk Care Advice Given Per Guideline SEE PCP WITHIN 24 HOURS: * IF OFFICE WILL BE OPEN: You need to be examined within the next 24 hours. Call your doctor (or NP/PA) when the office opens and make an appointment. DRINK EXTRA FLUIDS: * Drink 8 to 10 cups (1,800 to 2,400 ml) of liquids a day. CARE ADVICE given per Urine, Blood In (Adult) guideline. * You become worse * Fever or pain occurs CALL BACK IF: DRINK EXTRA FLUIDS - EXTRA NOTES AND WARNINGS: * Increased fluid intake may be contraindicated in adults with renal failure or heart failure. Referrals REFERRED TO PCP OFFICE

## 2022-10-05 ENCOUNTER — Encounter: Payer: Self-pay | Admitting: Nurse Practitioner

## 2022-10-05 ENCOUNTER — Ambulatory Visit (INDEPENDENT_AMBULATORY_CARE_PROVIDER_SITE_OTHER): Payer: Medicare Other | Admitting: Nurse Practitioner

## 2022-10-05 VITALS — BP 128/76 | HR 66 | Temp 96.5°F | Resp 14 | Ht 68.0 in | Wt 174.5 lb

## 2022-10-05 DIAGNOSIS — R310 Gross hematuria: Secondary | ICD-10-CM | POA: Diagnosis not present

## 2022-10-05 DIAGNOSIS — Z87442 Personal history of urinary calculi: Secondary | ICD-10-CM

## 2022-10-05 DIAGNOSIS — R361 Hematospermia: Secondary | ICD-10-CM

## 2022-10-05 LAB — POC URINALSYSI DIPSTICK (AUTOMATED)
Bilirubin, UA: NEGATIVE
Blood, UA: NEGATIVE
Glucose, UA: NEGATIVE
Ketones, UA: NEGATIVE
Leukocytes, UA: NEGATIVE
Nitrite, UA: NEGATIVE
Protein, UA: NEGATIVE
Spec Grav, UA: 1.025 (ref 1.010–1.025)
Urobilinogen, UA: 0.2 E.U./dL
pH, UA: 6 (ref 5.0–8.0)

## 2022-10-05 NOTE — Progress Notes (Signed)
Acute Office Visit  Subjective:     Patient ID: Ryan Crane, male    DOB: Jul 09, 1956, 66 y.o.   MRN: [id number]  Chief Complaint  Patient presents with   Hematuria    First noticed yesterday-10/04/22 morning x 2 but none since then. No burning or pain with urination. No stomach issues.    HPI Patient is in today for Hematuria  Patient has a history of hypertension, hyperlipidemia, hemochromatosis, nephrolithiasis, and type 1 diabetes.  States that he got up in the night and when he went to pee he noticed a spot of blood in the stream. States that he had it again when he woke up and it happened again  Patient did mention that he has had blood in his seamen in the past. He did discuss this with his provider. He states he is not sure if it is still there as he has not ejaculated recently   No recent injuries to genital area. No recent over exertion with physical activities or labor  Review of Systems  Constitutional:  Negative for chills and fever.  Respiratory:  Negative for shortness of breath.   Cardiovascular:  Negative for chest pain.  Genitourinary:  Positive for hematuria. Negative for dysuria, flank pain and frequency.        Objective:    BP 128/76   Pulse 66   Temp (!) 96.5 F (35.8 C) (Temporal)   Resp 14   Ht '5\' 8"'$  (1.727 m)   Wt 174 lb 8 oz (79.2 kg)   SpO2 98%   BMI 26.53 kg/m    Physical Exam Vitals and nursing note reviewed.  Constitutional:      Appearance: Normal appearance.  Cardiovascular:     Rate and Rhythm: Normal rate and regular rhythm.     Heart sounds: Normal heart sounds.  Pulmonary:     Effort: Pulmonary effort is normal.     Breath sounds: Normal breath sounds.  Abdominal:     General: Bowel sounds are normal. There is no distension.     Palpations: There is no mass.     Tenderness: There is no abdominal tenderness. There is no right CVA tenderness or left CVA tenderness.     Hernia: No hernia is present.  Genitourinary:     Comments: Deferred at patient request  Neurological:     Mental Status: He is alert.     Results for orders placed or performed in visit on 10/05/22  POCT Urinalysis Dipstick (Automated)  Result Value Ref Range   Color, UA yellow    Clarity, UA clear    Glucose, UA Negative Negative   Bilirubin, UA negative    Ketones, UA negative    Spec Grav, UA 1.025 1.010 - 1.025   Blood, UA negative    pH, UA 6.0 5.0 - 8.0   Protein, UA Negative Negative   Urobilinogen, UA 0.2 0.2 or 1.0 E.U./dL   Nitrite, UA negative    Leukocytes, UA Negative Negative        Assessment & Plan:   Problem List Items Addressed This Visit       Genitourinary   Gross hematuria - Primary    Asymptomatic gross hematuria x2.  UA in office negative for blood.  Patient does have a history of kidney stones.  Did review MRI of abdomen and 07/2021 that did not show any abnormalities with the kidneys.  Patient is not a smoker nor has he ever been.  No history  of kidney cancers in the family per patient report.  Did have discussion about watchful waiting.  If this happens again we can always consider pursuing further work-up with dedicated imaging of the kidneys.  Patient is in agreement with plan      Relevant Orders   POCT Urinalysis Dipstick (Automated) (Completed)     Other   Hematospermia    Made mention of this.  Unsure if he has any blood in semen still as he is not ejaculated recently.  Did discuss this is likely benign in nature.  Urinalysis was negative today.  He will reach out to the office for follow-up if this recurs      History of kidney stones    History of nephrolithiasis.  Patient has no other symptoms indicating such.       No orders of the defined types were placed in this encounter.   Return if symptoms worsen or fail to improve.  Romilda Garret, NP

## 2022-10-05 NOTE — Assessment & Plan Note (Signed)
Asymptomatic gross hematuria x2.  UA in office negative for blood.  Patient does have a history of kidney stones.  Did review MRI of abdomen and 07/2021 that did not show any abnormalities with the kidneys.  Patient is not a smoker nor has he ever been.  No history of kidney cancers in the family per patient report.  Did have discussion about watchful waiting.  If this happens again we can always consider pursuing further work-up with dedicated imaging of the kidneys.  Patient is in agreement with plan

## 2022-10-05 NOTE — Assessment & Plan Note (Signed)
Made mention of this.  Unsure if he has any blood in semen still as he is not ejaculated recently.  Did discuss this is likely benign in nature.  Urinalysis was negative today.  He will reach out to the office for follow-up if this recurs

## 2022-10-05 NOTE — Patient Instructions (Signed)
Nice to see you today The urine test came back normal with no blood Make sure you are drinking plenty of fluid to keep your pee a light yellow to clear color If you notice more blood in your seamen or urine let the office know

## 2022-10-05 NOTE — Assessment & Plan Note (Signed)
History of nephrolithiasis.  Patient has no other symptoms indicating such.

## 2022-10-09 ENCOUNTER — Telehealth: Payer: Self-pay | Admitting: Family Medicine

## 2022-10-09 MED ORDER — LEVOTHYROXINE SODIUM 137 MCG PO TABS: 137.0000 ug | ORAL_TABLET | Freq: Every day | ORAL | 3 refills | Status: DC

## 2022-10-09 NOTE — Telephone Encounter (Signed)
I need input from Dr. Dwyane Dee.  In your last note, you mentioned "TSH was low previously and since no changes were made and TSH is about the same will need to reduce his dose to 137 mcg levothyroxine instead of 150 Follow-up on next visit"  Did you decrease his levothyroxine in the meantime?  I did not send in a new prescription today for the patient as I wanted your input.

## 2022-10-09 NOTE — Telephone Encounter (Signed)
Jessica-please update patient.  I got a response from Dr. Dwyane Dee and he asked me to send in the lower dose of levothyroxine.  I did that.  The patient is going to need to follow-up with Dr. Dwyane Dee about his TSH level-he already has orders in the EMR from Dr. Dwyane Dee.  If the patient is having trouble with weight gain and sugar fluctuations then I want him to check with Dr. Dwyane Dee about that.  I routed this back in the meantime for input.  Thanks.

## 2022-10-09 NOTE — Telephone Encounter (Signed)
Spoke with patient about increase in levothyroxine and that rx has been sent in. Advised patient to f/u with Dr. Dwyane Dee about weight gain, sugars and TSH. Patient verbalized understanding.

## 2022-10-09 NOTE — Addendum Note (Signed)
Addended by: Tonia Ghent on: 10/09/2022 03:24 PM   Modules accepted: Orders

## 2022-10-09 NOTE — Telephone Encounter (Signed)
Looks like last TSH labs done was on 09/11/22 and was 0.26. Would you want to do any adjustments?

## 2022-10-09 NOTE — Telephone Encounter (Signed)
Pt called stating he has been having some issues with his blood sugar levels & gaining weight to where he is uncomfortable. Pt stated Dr. Elayne Snare, MD stated Dr. Damita Dunnings should adjust the dosage on the meds, levothyroxine (SYNTHROID) 150 MCG tablet due to that being the reason for the weight gain & blood sugar issues. Pt believes Dr. Damita Dunnings & Dr. Dwyane Dee should discuss further details about the adjustment. Pt is requesting for the adjustment to be completed if Dr. Damita Dunnings thinks it is best. Call back # [phone]

## 2022-10-18 ENCOUNTER — Other Ambulatory Visit: Payer: Self-pay

## 2022-10-18 DIAGNOSIS — E10649 Type 1 diabetes mellitus with hypoglycemia without coma: Secondary | ICD-10-CM

## 2022-10-18 MED ORDER — LANTUS SOLOSTAR 100 UNIT/ML ~~LOC~~ SOPN: 26.0000 [IU] | PEN_INJECTOR | Freq: Every day | SUBCUTANEOUS | 99 refills | Status: DC

## 2022-11-06 ENCOUNTER — Ambulatory Visit: Payer: Medicare Other

## 2022-11-06 ENCOUNTER — Encounter: Payer: Medicare Other | Attending: Endocrinology | Admitting: Nutrition

## 2022-11-07 ENCOUNTER — Ambulatory Visit (INDEPENDENT_AMBULATORY_CARE_PROVIDER_SITE_OTHER): Payer: Medicare Other

## 2022-11-07 DIAGNOSIS — E538 Deficiency of other specified B group vitamins: Secondary | ICD-10-CM

## 2022-11-07 MED ORDER — CYANOCOBALAMIN 1000 MCG/ML IJ SOLN
1000.0000 ug | Freq: Once | INTRAMUSCULAR | Status: AC
  Administered 2022-11-07: 1000 ug via INTRAMUSCULAR

## 2022-11-07 NOTE — Progress Notes (Signed)
Per orders of Allie Bossier, DPN AGNP-C, injection of B-12 given by Barkley Bruns in left deltoid. Patient tolerated injection well.

## 2022-12-05 ENCOUNTER — Ambulatory Visit: Payer: Medicare Other

## 2022-12-07 ENCOUNTER — Other Ambulatory Visit: Payer: Self-pay | Admitting: Family Medicine

## 2022-12-12 ENCOUNTER — Ambulatory Visit (INDEPENDENT_AMBULATORY_CARE_PROVIDER_SITE_OTHER): Payer: Medicare Other

## 2022-12-12 DIAGNOSIS — E538 Deficiency of other specified B group vitamins: Secondary | ICD-10-CM

## 2022-12-12 MED ORDER — CYANOCOBALAMIN 1000 MCG/ML IJ SOLN
1000.0000 ug | Freq: Once | INTRAMUSCULAR | Status: AC
  Administered 2022-12-12: 1000 ug via INTRAMUSCULAR

## 2022-12-12 NOTE — Progress Notes (Signed)
Per orders of Dr. Ria Bush, injection of Vitamin B 12 given in right deltoid given by Ozzie Hoyle. Patient tolerated injection well.

## 2022-12-27 ENCOUNTER — Other Ambulatory Visit: Payer: Self-pay | Admitting: Family Medicine

## 2023-01-02 ENCOUNTER — Ambulatory Visit: Payer: Medicare Other

## 2023-01-10 ENCOUNTER — Other Ambulatory Visit (INDEPENDENT_AMBULATORY_CARE_PROVIDER_SITE_OTHER): Payer: Medicare Other

## 2023-01-10 DIAGNOSIS — E063 Autoimmune thyroiditis: Secondary | ICD-10-CM

## 2023-01-10 DIAGNOSIS — E1165 Type 2 diabetes mellitus with hyperglycemia: Secondary | ICD-10-CM

## 2023-01-10 DIAGNOSIS — Z794 Long term (current) use of insulin: Secondary | ICD-10-CM | POA: Diagnosis not present

## 2023-01-10 LAB — BASIC METABOLIC PANEL
BUN: 21 mg/dL (ref 6–23)
CO2: 28 mEq/L (ref 19–32)
Calcium: 10 mg/dL (ref 8.4–10.5)
Chloride: 103 mEq/L (ref 96–112)
Creatinine, Ser: 1.19 mg/dL (ref 0.40–1.50)
GFR: 62.84 mL/min (ref >60.00)
Glucose, Bld: 120 mg/dL — ABNORMAL HIGH (ref 70–99)
Potassium: 4.1 mEq/L (ref 3.5–5.1)
Sodium: 139 mEq/L (ref 135–145)

## 2023-01-10 LAB — TSH: TSH: 0.43 u[IU]/mL (ref 0.35–5.50)

## 2023-01-10 LAB — T4, FREE: Free T4: 1.23 ng/dL (ref 0.60–1.60)

## 2023-01-10 LAB — HEMOGLOBIN A1C: Hgb A1c MFr Bld: 6.5 % (ref 4.6–6.5)

## 2023-01-14 ENCOUNTER — Ambulatory Visit: Payer: Medicare Other | Admitting: Endocrinology

## 2023-01-14 ENCOUNTER — Encounter: Payer: Self-pay | Admitting: Endocrinology

## 2023-01-14 VITALS — BP 140/82 | HR 71 | Ht 68.0 in | Wt 177.0 lb

## 2023-01-14 DIAGNOSIS — E1165 Type 2 diabetes mellitus with hyperglycemia: Secondary | ICD-10-CM | POA: Diagnosis not present

## 2023-01-14 DIAGNOSIS — Z794 Long term (current) use of insulin: Secondary | ICD-10-CM | POA: Diagnosis not present

## 2023-01-14 DIAGNOSIS — E063 Autoimmune thyroiditis: Secondary | ICD-10-CM

## 2023-01-14 NOTE — Progress Notes (Unsigned)
Patient ID: Ryan Crane, male   DOB: 08/02/1956, 67 y.o.   MRN: [id number]            Reason for Appointment: Type II Diabetes follow-up   History of Present Illness   Diagnosis date: 2013  Previous history:  Non-insulin hypoglycemic drugs previously used: Insulin was started in 2019  A1c range in the last few years is:  Recent history:     Non-insulin hypoglycemic drugs: None     Insulin regimen: Humalog 18 units before breakfast,16-18 before lunch and 18 before dinner ac     Tresiba 26 units daily    Side effects from medications: None  Current self management, blood sugar patterns and problems identified:  A1c is 6.2 compared to 7.1  With switching to [place] from Lantus his blood sugars appear to be a lot more stable and his overall control is better Fasting readings are more consistent and he has not had any low sugars overnight However his blood sugars after dinner appear to fluctuate significantly He is usually not adjusting his mealtime dose based on what he is eating or his activity level; not able to count carbohydrates currently Blood sugars start trending lower between breakfast and lunch and today in the office he was feeling well and hypoglycemic This is despite having 3 eggs with his toast for breakfast He is not clear what foods make his blood sugar go up especially at night after dinner with occasional readings over 300 He is still not keen on having a CGM for his monitoring  Exercise: some walking, golf, yard work on weekends  Diet management:     Hypoglycemia: As above, symptoms of numbness in the lips, dizziness, treated with glucose tablets  Glucometer: One Touch Verio.            Blood Glucose readings from meter download:    PRE-MEAL Fasting Lunch Dinner Bedtime Overall  Glucose range:       Mean/median:     142   POST-MEAL PC Breakfast PC Lunch PC Dinner  Glucose range:     Mean/median:      Post   PRE-MEAL Fasting Lunch Dinner  Bedtime Overall  Glucose range: 86-185   95-314 47-327  Mean/median: 135 120 124 178 134   POST-MEAL PC Breakfast PC Lunch PC Dinner  Glucose range:     Mean/median: 73      Dietician visit: Most recent: 2014 for hemochromatosis  Weight control:  Wt Readings from Last 3 Encounters:  01/14/23 177 lb (80.3 kg)  10/05/22 174 lb 8 oz (79.2 kg)  09/13/22 175 lb 6.4 oz (79.6 kg)            Diabetes labs:  Lab Results  Component Value Date   HGBA1C 6.5 01/10/2023   HGBA1C 6.2 09/11/2022   HGBA1C 7.1 (A) 06/13/2022   Lab Results  Component Value Date   MICROALBUR <0.7 06/13/2022   LDLCALC 92 04/17/2022   CREATININE 1.19 01/10/2023     Allergies as of 01/14/2023       Reactions   Flomax [tamsulosin Hcl]    nausea        Medication List        Accurate as of January 14, 2023  3:22 PM. If you have any questions, ask your nurse or doctor.          STOP taking these medications    Lantus SoloStar 100 UNIT/ML Solostar Pen Generic drug: insulin glargine Stopped by: Elayne Snare, MD  TAKE these medications    Accu-Chek Guide Me w/Device Kit 1 each by Does not apply route 4 (four) times daily. E11.9   Accu-Chek Softclix Lancets lancets 1 each by Other route 4 (four) times daily. B93.9   OneTouch Delica Lancets 03E Misc Use to check BS 7x a day   OneTouch Delica Plus [id number] Misc USE TO TEST BLOOD SUGAR 4 TIMES DAILY   albuterol 108 (90 Base) MCG/ACT inhaler Commonly known as: VENTOLIN HFA Inhale 2 puffs into the lungs every 6 (six) hours as needed for wheezing or shortness of breath.   aspirin 81 MG tablet Take 81 mg by mouth daily.   BD Pen Needle Nano 2nd Gen 32G X 4 MM Misc Generic drug: Insulin Pen Needle USE 4 TIMES A DAY   cyanocobalamin 1000 MCG/ML injection Commonly known as: VITAMIN B12 Inject 1,000 mcg into the muscle every 30 (thirty) days.   diclofenac sodium 1 % Gel Commonly known as: VOLTAREN Apply 2 g topically 4 (four)  times daily as needed.   fenofibrate 160 MG tablet Take 1 tablet (160 mg total) by mouth daily.   fluticasone 50 MCG/ACT nasal spray Commonly known as: FLONASE Place 1 spray into both nostrils daily.   insulin lispro 100 UNIT/ML KwikPen Commonly known as: HumaLOG KwikPen 3 times a day (just before each meal) 16-18-18 units   Insulin Syringe-Needle U-100 30G X 1/2" 0.5 ML Misc 1 each by Does not apply route daily. E11.9   levothyroxine 137 MCG tablet Commonly known as: SYNTHROID Take 1 tablet (137 mcg total) by mouth daily.   lisinopril 20 MG tablet Commonly known as: ZESTRIL Take 1 tablet (20 mg total) by mouth daily.   omeprazole 40 MG capsule Commonly known as: PRILOSEC Take 1 capsule (40 mg total) by mouth daily.   OneTouch Verio test strip Generic drug: glucose blood USE TO TEST BLOOD SUGARS AS DIRECTED 7 TIMES A DAY.   oxyCODONE-acetaminophen 5-325 MG tablet Commonly known as: PERCOCET/ROXICET Take 1-2 tablets by mouth every 6 (six) hours as needed (for kidney stones).   pravastatin 10 MG tablet Commonly known as: PRAVACHOL TAKE 1 TABLET BY MOUTH EVERYDAY AT BEDTIME   Tresiba FlexTouch 100 UNIT/ML FlexTouch Pen Generic drug: insulin degludec Inject 26 units daily        Allergies:  Allergies  Allergen Reactions   Flomax [Tamsulosin Hcl]     nausea    Past Medical History:  Diagnosis Date   Allergy    Arthritis    Cholelithiasis    symptomatic   Diabetes mellitus ([place])    Family history of malignant neoplasm of gastrointestinal tract    Fatty liver    GERD (gastroesophageal reflux disease)    Graves disease    s/p ablation   Hemochromatosis    History of chicken pox    History of hiatal hernia    History of kidney stones    Hyperlipidemia    Hypertension    Internal hemorrhoids    Kidney stones     Personal history of colonic polyps 03/31/2008   tubular adenoma   Right fibular fracture 2013    Past Surgical History:  Procedure  Laterality Date   CHOLECYSTECTOMY N/A 05/29/2017   Procedure: LAPAROSCOPIC CHOLECYSTECTOMY;  Surgeon: Clovis Riley, MD;  Location: [facility] OR;  Service: General;  Laterality: N/A;   COLONOSCOPY W/ BIOPSIES AND POLYPECTOMY     LITHOTRIPSY     URETERAL STENT PLACEMENT     left    Family  History  Problem Relation Age of Onset   COPD Mother    Arthritis Mother    Diabetes Mother    Hypertension Mother    Lung cancer Father    Colon cancer Maternal Grandmother 65   Prostate cancer Neg Hx    Esophageal cancer Neg Hx    Rectal cancer Neg Hx    Stomach cancer Neg Hx    Thyroid disease Neg Hx     Social History:  reports that he has never smoked. He has never used smokeless tobacco. He reports that he does not drink alcohol and does not use drugs.  Review of Systems:  Last diabetic eye exam date 8/22  Last foot exam date: 7/22  Symptoms of neuropathy: None  Hypertension:   Treatment includes lisinopril 20 mg daily  BP Readings from Last 3 Encounters:  01/14/23 (!) 140/82  10/05/22 128/76  09/13/22 (!) 152/82   No history of renal dysfunction or hyperkalemia  Lab Results  Component Value Date   CREATININE 1.19 01/10/2023   BUN 21 01/10/2023   NA 139 01/10/2023   K 4.1 01/10/2023   CL 103 01/10/2023   CO2 28 01/10/2023     Lipid management: Has been on pravastatin 10 mg daily from his PCP Also has been on fenofibrate 160 mg, previously had mild increase in triglycerides    Lab Results  Component Value Date   CHOL 153 04/17/2022   CHOL 151 06/30/2021   CHOL 189 04/17/2021   Lab Results  Component Value Date   HDL 35.70 (L) 04/17/2022   HDL 52.40 06/30/2021   HDL 40.20 04/17/2021   Lab Results  Component Value Date   LDLCALC 92 04/17/2022   LDLCALC 84 06/30/2021   LDLCALC 118 (H) 04/17/2021   Lab Results  Component Value Date   TRIG 124.0 04/17/2022   TRIG 73.0 06/30/2021   TRIG 154.0 (H) 04/17/2021   Lab Results  Component Value Date   CHOLHDL 4  04/17/2022   CHOLHDL 3 06/30/2021   CHOLHDL 5 04/17/2021   Lab Results  Component Value Date   LDLDIRECT 120.8 01/16/2011   HYPOTHYROIDISM followed by his PCP Taking 137 mcg levothyroxine TSH is   Lab Results  Component Value Date   TSH 0.43 01/10/2023   TSH 0.26 (L) 09/11/2022   TSH 0.27 (L) 04/17/2022   FREET4 1.23 01/10/2023     Examination:   BP (!) 140/82 (BP Location: Right Arm, Patient Position: Sitting, Cuff Size: Normal)   Pulse 71   Ht '5\' 8"'$  (1.727 m)   Wt 177 lb (80.3 kg)   SpO2 96%   BMI 26.91 kg/m   Body mass index is 26.91 kg/m.   Diabetic Foot Exam - Simple   Simple Foot Form Diabetic Foot exam was performed with the following findings: Yes   Visual Inspection No deformities, no ulcerations, no other skin breakdown bilaterally: Yes Sensation Testing Intact to touch and monofilament testing bilaterally: Yes Pulse Check Posterior Tibialis and Dorsalis pulse intact bilaterally: Yes Comments     ASSESSMENT/ PLAN:    Diabetes type 2 insulin-dependent:   Current regimen: Basal bolus insulin with Lantus and Humalog  See history of present illness for detailed discussion of current diabetes management, blood sugar patterns and problems identified  A1c is 6.2 compared to 7.1  With switching from Lantus to [place] his blood sugars are much more evenly controlled and also overall control is better However he is not adjusting his mealtime insulin based on  activity level or meal size and this is causing more significant variability around lunchtime and bedtime Also has tendency to hypoglycemia late morning at times and before dinner periodically He is not interested in trying a CGM with despite explaining the advantages  Recommendations: Continue Tresiba 26 units Take only 15 units of Humalog for breakfast and adjust to keep readings midmorning and before lunch at least over 90 Adjust lunch dose based on activity level and likely needs only 18 units  when more active but then may need to up to 24 units for larger meals or more carbohydrate Similarly he will adjust his evening dose 2 to 4 units higher for larger meals, desserts or higher fat or carbohydrate meals Referral made for diabetes education with nurse educator for help with meal planning, adjustment of insulin based on carbohydrates and general education He will let us know if he changes his mind about starting CGM  HYPERLIPIDEMIA: Managed by his PCP and currently on a low-dose statin and fenofibrate with adequate control  HYPOTHYROIDISM: TSH was low previously and since no changes were made and TSH is about the same will need to reduce his dose to 137 mcg levothyroxine instead of 150 Follow-up on next visit  There are no Patient Instructions on file for this visit.   Elayne Snare 01/14/2023, 3:22 PM

## 2023-01-14 NOTE — Patient Instructions (Signed)
Take 16 at breakfast  10 at lunch for lo carb meals

## 2023-01-16 ENCOUNTER — Ambulatory Visit (INDEPENDENT_AMBULATORY_CARE_PROVIDER_SITE_OTHER): Payer: Medicare Other | Admitting: *Deleted

## 2023-01-16 DIAGNOSIS — E538 Deficiency of other specified B group vitamins: Secondary | ICD-10-CM

## 2023-01-16 MED ORDER — CYANOCOBALAMIN 1000 MCG/ML IJ SOLN
1000.0000 ug | Freq: Once | INTRAMUSCULAR | Status: AC
  Administered 2023-01-16: 1000 ug via INTRAMUSCULAR

## 2023-01-16 NOTE — Progress Notes (Signed)
Per orders of Dr. Gutierrez, injection of Vitamin B-12 given by Wilmina Maxham. Patient tolerated injection well. 

## 2023-02-20 ENCOUNTER — Ambulatory Visit (INDEPENDENT_AMBULATORY_CARE_PROVIDER_SITE_OTHER): Payer: Medicare Other

## 2023-02-20 DIAGNOSIS — E538 Deficiency of other specified B group vitamins: Secondary | ICD-10-CM

## 2023-02-20 MED ORDER — CYANOCOBALAMIN 1000 MCG/ML IJ SOLN
1000.0000 ug | Freq: Once | INTRAMUSCULAR | Status: AC
  Administered 2023-02-20: 1000 ug via INTRAMUSCULAR

## 2023-02-20 NOTE — Progress Notes (Signed)
Patient presented for B 12 injection given by Lyrica Mcclarty, CMA to right deltoid, patient voiced no concerns nor showed any signs of distress during injection.  

## 2023-02-24 ENCOUNTER — Other Ambulatory Visit: Payer: Self-pay | Admitting: Family Medicine

## 2023-03-08 ENCOUNTER — Other Ambulatory Visit: Payer: Self-pay | Admitting: Family Medicine

## 2023-03-09 ENCOUNTER — Other Ambulatory Visit: Payer: Self-pay | Admitting: Endocrinology

## 2023-03-09 DIAGNOSIS — E10649 Type 1 diabetes mellitus with hypoglycemia without coma: Secondary | ICD-10-CM

## 2023-03-26 ENCOUNTER — Ambulatory Visit (INDEPENDENT_AMBULATORY_CARE_PROVIDER_SITE_OTHER): Payer: Medicare Other | Admitting: *Deleted

## 2023-03-26 DIAGNOSIS — E538 Deficiency of other specified B group vitamins: Secondary | ICD-10-CM

## 2023-03-26 MED ORDER — CYANOCOBALAMIN 1000 MCG/ML IJ SOLN
1000.0000 ug | Freq: Once | INTRAMUSCULAR | Status: AC
  Administered 2023-03-26: 1000 ug via INTRAMUSCULAR

## 2023-03-26 NOTE — Progress Notes (Signed)
Per orders of Dr. Duncan, injection of Vitamin B-12 given by Charish Schroepfer. Patient tolerated injection well. 

## 2023-04-01 ENCOUNTER — Other Ambulatory Visit: Payer: Self-pay

## 2023-04-01 ENCOUNTER — Encounter: Payer: Self-pay | Admitting: Endocrinology

## 2023-04-01 DIAGNOSIS — E10649 Type 1 diabetes mellitus with hypoglycemia without coma: Secondary | ICD-10-CM

## 2023-04-01 MED ORDER — INSULIN LISPRO (1 UNIT DIAL) 100 UNIT/ML (KWIKPEN): PEN_INJECTOR | SUBCUTANEOUS | 3 refills | Status: DC

## 2023-04-01 MED ORDER — TRESIBA FLEXTOUCH 100 UNIT/ML ~~LOC~~ SOPN: PEN_INJECTOR | SUBCUTANEOUS | 2 refills | Status: DC

## 2023-04-05 ENCOUNTER — Other Ambulatory Visit: Payer: Self-pay | Admitting: Family Medicine

## 2023-04-09 ENCOUNTER — Other Ambulatory Visit: Payer: Self-pay

## 2023-04-09 DIAGNOSIS — E10649 Type 1 diabetes mellitus with hypoglycemia without coma: Secondary | ICD-10-CM

## 2023-04-09 MED ORDER — ONETOUCH VERIO VI STRP
ORAL_STRIP | 3 refills | Status: DC
Start: 2023-04-09 — End: 2024-02-06

## 2023-04-22 ENCOUNTER — Other Ambulatory Visit (INDEPENDENT_AMBULATORY_CARE_PROVIDER_SITE_OTHER): Payer: Medicare Other

## 2023-04-22 DIAGNOSIS — E0500 Thyrotoxicosis with diffuse goiter without thyrotoxic crisis or storm: Secondary | ICD-10-CM

## 2023-04-22 DIAGNOSIS — Z794 Long term (current) use of insulin: Secondary | ICD-10-CM | POA: Diagnosis not present

## 2023-04-22 DIAGNOSIS — E10649 Type 1 diabetes mellitus with hypoglycemia without coma: Secondary | ICD-10-CM

## 2023-04-22 DIAGNOSIS — E1165 Type 2 diabetes mellitus with hyperglycemia: Secondary | ICD-10-CM | POA: Diagnosis not present

## 2023-04-22 LAB — BASIC METABOLIC PANEL
BUN: 20 mg/dL (ref 6–23)
CO2: 27 mEq/L (ref 19–32)
Calcium: 9.4 mg/dL (ref 8.4–10.5)
Chloride: 103 mEq/L (ref 96–112)
Creatinine, Ser: 1.17 mg/dL (ref 0.40–1.50)
GFR: 64.01 mL/min (ref >60.00)
Glucose, Bld: 111 mg/dL — ABNORMAL HIGH (ref 70–99)
Potassium: 4.5 mEq/L (ref 3.5–5.1)
Sodium: 139 mEq/L (ref 135–145)

## 2023-04-22 LAB — HEMOGLOBIN A1C: Hgb A1c MFr Bld: 6.3 % (ref 4.6–6.5)

## 2023-04-22 LAB — FERRITIN: Ferritin: 115.7 ng/mL (ref 22.0–322.0)

## 2023-04-22 LAB — TSH: TSH: 0.47 u[IU]/mL (ref 0.35–5.50)

## 2023-04-29 ENCOUNTER — Ambulatory Visit (INDEPENDENT_AMBULATORY_CARE_PROVIDER_SITE_OTHER): Payer: Medicare Other | Admitting: Family Medicine

## 2023-04-29 ENCOUNTER — Encounter: Payer: Self-pay | Admitting: Family Medicine

## 2023-04-29 VITALS — BP 122/80 | HR 59 | Temp 97.6°F | Ht 68.0 in | Wt 174.0 lb

## 2023-04-29 DIAGNOSIS — I10 Essential (primary) hypertension: Secondary | ICD-10-CM

## 2023-04-29 DIAGNOSIS — E10649 Type 1 diabetes mellitus with hypoglycemia without coma: Secondary | ICD-10-CM

## 2023-04-29 DIAGNOSIS — R310 Gross hematuria: Secondary | ICD-10-CM | POA: Diagnosis not present

## 2023-04-29 DIAGNOSIS — E538 Deficiency of other specified B group vitamins: Secondary | ICD-10-CM | POA: Diagnosis not present

## 2023-04-29 DIAGNOSIS — R0609 Other forms of dyspnea: Secondary | ICD-10-CM

## 2023-04-29 DIAGNOSIS — Z0000 Encounter for general adult medical examination without abnormal findings: Secondary | ICD-10-CM | POA: Diagnosis not present

## 2023-04-29 DIAGNOSIS — Z7189 Other specified counseling: Secondary | ICD-10-CM

## 2023-04-29 DIAGNOSIS — Z125 Encounter for screening for malignant neoplasm of prostate: Secondary | ICD-10-CM | POA: Diagnosis not present

## 2023-04-29 DIAGNOSIS — E785 Hyperlipidemia, unspecified: Secondary | ICD-10-CM | POA: Diagnosis not present

## 2023-04-29 DIAGNOSIS — Z1211 Encounter for screening for malignant neoplasm of colon: Secondary | ICD-10-CM

## 2023-04-29 DIAGNOSIS — R319 Hematuria, unspecified: Secondary | ICD-10-CM

## 2023-04-29 DIAGNOSIS — E039 Hypothyroidism, unspecified: Secondary | ICD-10-CM

## 2023-04-29 LAB — CBC WITH DIFFERENTIAL/PLATELET
Basophils Absolute: 0 10*3/uL (ref 0.0–0.1)
Basophils Relative: 0.6 % (ref 0.0–3.0)
Eosinophils Absolute: 0.3 10*3/uL (ref 0.0–0.7)
Eosinophils Relative: 5.7 % — ABNORMAL HIGH (ref 0.0–5.0)
HCT: 45.1 % (ref 39.0–52.0)
Hemoglobin: 15.6 g/dL (ref 13.0–17.0)
Lymphocytes Relative: 26.5 % (ref 12.0–46.0)
Lymphs Abs: 1.5 10*3/uL (ref 0.7–4.0)
MCHC: 34.7 g/dL (ref 30.0–36.0)
MCV: 97.7 fl (ref 78.0–100.0)
Monocytes Absolute: 0.6 10*3/uL (ref 0.1–1.0)
Monocytes Relative: 11.1 % (ref 3.0–12.0)
Neutro Abs: 3.1 10*3/uL (ref 1.4–7.7)
Neutrophils Relative %: 56.1 % (ref 43.0–77.0)
Platelets: 202 10*3/uL (ref 150.0–400.0)
RBC: 4.62 Mil/uL (ref 4.22–5.81)
RDW: 12.9 % (ref 11.5–15.5)
WBC: 5.6 10*3/uL (ref 4.0–10.5)

## 2023-04-29 LAB — LIPID PANEL
Cholesterol: 150 mg/dL (ref 0–200)
HDL: 34.9 mg/dL — ABNORMAL LOW (ref >39.00)
NonHDL: 115.34
Total CHOL/HDL Ratio: 4
Triglycerides: 210 mg/dL — ABNORMAL HIGH (ref 0.0–149.0)
VLDL: 42 mg/dL — ABNORMAL HIGH (ref 0.0–40.0)

## 2023-04-29 LAB — HEPATIC FUNCTION PANEL
ALT: 22 U/L (ref 0–53)
AST: 33 U/L (ref 0–37)
Albumin: 4.1 g/dL (ref 3.5–5.2)
Alkaline Phosphatase: 100 U/L (ref 39–117)
Bilirubin, Direct: 0.2 mg/dL (ref 0.0–0.3)
Total Bilirubin: 0.8 mg/dL (ref 0.2–1.2)
Total Protein: 6.4 g/dL (ref 6.0–8.3)

## 2023-04-29 LAB — LDL CHOLESTEROL, DIRECT: Direct LDL: 91 mg/dL

## 2023-04-29 MED ORDER — OXYCODONE-ACETAMINOPHEN 5-325 MG PO TABS: 1.0000 | ORAL_TABLET | Freq: Four times a day (QID) | ORAL | 0 refills | Status: DC | PRN

## 2023-04-29 MED ORDER — LEVOTHYROXINE SODIUM 137 MCG PO TABS: 137.0000 ug | ORAL_TABLET | Freq: Every day | ORAL | 3 refills | Status: DC

## 2023-04-29 MED ORDER — OMEPRAZOLE 40 MG PO CPDR: 40.0000 mg | DELAYED_RELEASE_CAPSULE | Freq: Every day | ORAL | 3 refills | Status: DC

## 2023-04-29 MED ORDER — CYANOCOBALAMIN 1000 MCG/ML IJ SOLN
1000.0000 ug | Freq: Once | INTRAMUSCULAR | Status: AC
Start: 2023-04-29 — End: 2023-04-29
  Administered 2023-04-29: 1000 ug via INTRAMUSCULAR

## 2023-04-29 NOTE — Progress Notes (Unsigned)
I have personally reviewed the Medicare Annual Wellness questionnaire and have noted 1. The patient's medical and social history 2. Their use of alcohol, tobacco or illicit drugs 3. Their current medications and supplements 4. The patient's functional ability including ADL's, fall risks, home safety risks and hearing or visual             impairment. 5. Diet and physical activities 6. Evidence for depression or mood disorders  The patients weight, height, BMI have been recorded in the chart and visual acuity is per eye clinic.  I have made referrals, counseling and provided education to the patient based review of the above and I have provided the pt with a written personalized care plan for preventive services.  Provider list updated- see scanned forms.  Routine anticipatory guidance given to patient.  See health maintenance. The possibility exists that previously documented standard health maintenance information may have been brought forward from a previous encounter into this note.  If needed, that same information has been updated to reflect the current situation based on today's encounter.    Flu Shingles PNA Tetanus Colon  Breast cancer screening Prostate cancer screening Advance directive Cognitive function addressed- see scanned forms- and if abnormal then additional documentation follows.   In addition to [facility], follow up visit for the below conditions:  B12 def.  On replacement monthly.    Elevated Cholesterol: Using medications without problems: yes Muscle aches: no Diet compliance: yes Exercise: yes  Hypothyroidism.  On replacement at baseline. Compliant.     Hypertension:    Using medication without problems or lightheadedness: yes Chest pain with exertion: no Edema:no Short of breath: only with sig exertion, ie walking up an incline.    DM per endo.  Had eye exam- [facility].   Swallowing well, d/w pt about HH noted.    H/o renal stones with  occ blood on the initial portion of urine stream. About once a month but irregular.  No pain with urination.   Recent sinus drainage noted by patient.  No fevers.  Noted after dust exposure when working outside.   PMH and SH reviewed  Meds, vitals, and allergies reviewed.   ROS: Per HPI.  Unless specifically indicated otherwise in HPI, the patient denies:  General: fever. Eyes: acute vision changes ENT: sore throat Cardiovascular: chest pain Respiratory: SOB GI: vomiting GU: dysuria Musculoskeletal: acute back pain Derm: acute rash Neuro: acute motor dysfunction Psych: worsening mood Endocrine: polydipsia Heme: bleeding Allergy: hayfever  GEN: nad, alert and oriented HEENT: mucous membranes moist NECK: supple w/o LA CV: rrr. PULM: ctab, no inc wob ABD: soft, +bs EXT: no edema SKIN: no acute rash

## 2023-04-29 NOTE — Patient Instructions (Addendum)
Let me know if you don't get a call about seeing urology.  Take care.  Glad to see you. Go to the lab on the way out.   If you have mychart we'll likely use that to update you.    You should get a call about seeing GI.   Repeat B12 shot in about 1 month.

## 2023-04-30 LAB — VITAMIN B12: Vitamin B-12: 562 pg/mL (ref 211–911)

## 2023-04-30 LAB — PSA, MEDICARE: PSA: 1.24 ng/ml (ref 0.10–4.00)

## 2023-04-30 LAB — BRAIN NATRIURETIC PEPTIDE: Pro B Natriuretic peptide (BNP): 19 pg/mL (ref 0.0–100.0)

## 2023-05-01 ENCOUNTER — Other Ambulatory Visit: Payer: Self-pay | Admitting: Family Medicine

## 2023-05-01 DIAGNOSIS — E039 Hypothyroidism, unspecified: Secondary | ICD-10-CM | POA: Insufficient documentation

## 2023-05-01 DIAGNOSIS — R0609 Other forms of dyspnea: Secondary | ICD-10-CM

## 2023-05-01 NOTE — Assessment & Plan Note (Signed)
He is working to maintain his diet to balance his considerations with hemochromatosis and diabetes.  See notes on follow-up labs.

## 2023-05-01 NOTE — Assessment & Plan Note (Signed)
H/o renal stones with occ blood on the initial portion of urine stream.  This happens about once a month but irregularly.  No pain with urination.  Urology referral placed.

## 2023-05-01 NOTE — Assessment & Plan Note (Signed)
B12 def.  On replacement monthly.  Compliant.  See notes on labs.  Would continue replacement as is.

## 2023-05-01 NOTE — Assessment & Plan Note (Addendum)
See notes on labs.  Continue pravastatin.  Continue fenofibrate.

## 2023-05-01 NOTE — Assessment & Plan Note (Signed)
Continue lisinopril.  See notes on labs. °

## 2023-05-01 NOTE — Assessment & Plan Note (Signed)
Flu 2023 Shingles previously done PNA previously done Tetanus 2019 COVID-vaccine previously done Colon cancer screening-refer to GI 2024. Prostate cancer screening pending 2024. Advance directive-wife designated if patient were incapacitated. Cognitive function addressed- see scanned forms- and if abnormal then additional documentation follows.

## 2023-05-01 NOTE — Assessment & Plan Note (Signed)
On replacement at baseline. Compliant.   TSH normal 04/2023.  Would continue levothyroxine as is.

## 2023-05-01 NOTE — Assessment & Plan Note (Signed)
Advance directive- wife designated if patient were incapacitated.  

## 2023-05-02 ENCOUNTER — Encounter: Payer: Self-pay | Admitting: *Deleted

## 2023-05-09 ENCOUNTER — Encounter: Payer: Self-pay | Admitting: Family Medicine

## 2023-05-09 ENCOUNTER — Telehealth: Payer: Self-pay

## 2023-05-09 ENCOUNTER — Ambulatory Visit (INDEPENDENT_AMBULATORY_CARE_PROVIDER_SITE_OTHER): Payer: Medicare Other | Admitting: Family Medicine

## 2023-05-09 DIAGNOSIS — R059 Cough, unspecified: Secondary | ICD-10-CM | POA: Diagnosis not present

## 2023-05-09 DIAGNOSIS — R051 Acute cough: Secondary | ICD-10-CM

## 2023-05-09 MED ORDER — PROMETHAZINE-DM 6.25-15 MG/5ML PO SYRP: 5.0000 mL | ORAL_SOLUTION | Freq: Four times a day (QID) | ORAL | 0 refills | Status: DC | PRN

## 2023-05-09 MED ORDER — DOXYCYCLINE HYCLATE 100 MG PO TABS: 100.0000 mg | ORAL_TABLET | Freq: Two times a day (BID) | ORAL | 0 refills | Status: DC

## 2023-05-09 MED ORDER — GUAIFENESIN-CODEINE 100-10 MG/5ML PO SOLN
5.0000 mL | Freq: Two times a day (BID) | ORAL | 0 refills | Status: DC | PRN
Start: 2023-05-09 — End: 2023-05-09

## 2023-05-09 NOTE — Telephone Encounter (Signed)
Received notification from pharmacy that guaiFENesin-codeine 100-10 MG/5ML syrup is on backorder. They have promethazine DM available.

## 2023-05-09 NOTE — Addendum Note (Signed)
Addended by: Joaquim Nam on: 05/09/2023 02:37 PM   Modules accepted: Orders

## 2023-05-09 NOTE — Patient Instructions (Addendum)
Don't change your regular meds.  Add on doxycycline, sunburn caution.  Rest and fluids.  Sedation caution on the cough syrup.  Take care.  Glad to see you.

## 2023-05-09 NOTE — Progress Notes (Signed)
He has endocrine and cardiology f/u pending.    Cough inc since the last OV.  Going on >1 week, ~2 weeks.  Patient states he has a hard time getting anything up when coughs. He has been using flonase, his inhaler as needed and cough drops.  Inhaler didn't help much.  Inhaler prev helped with other episodes that were allergy related.  No fevers.  No vomiting. No diarrhea.  Some wheeze, no change with inhaler.  No facial pain.  No chest pain.  No ear pain.  Scant sputum production, occ yellow/dark.  Vick's chest rub historically helped his cough.    Meds, vitals, and allergies reviewed.   ROS: Per HPI unless specifically indicated in ROS section   Nad ncat TM wnl Nasal and OP exam wnl.  Neck supple, no LA Ctab RRR Abdomen soft. Skin well-perfused.

## 2023-05-09 NOTE — Telephone Encounter (Signed)
Patient notified of rx change. 

## 2023-05-09 NOTE — Telephone Encounter (Signed)
Sent.  Please update patient.  Thanks.

## 2023-05-10 ENCOUNTER — Encounter: Payer: Self-pay | Admitting: "Endocrinology

## 2023-05-10 ENCOUNTER — Ambulatory Visit: Payer: Medicare Other | Admitting: "Endocrinology

## 2023-05-10 VITALS — BP 118/76 | HR 69 | Ht 68.0 in | Wt 173.4 lb

## 2023-05-10 DIAGNOSIS — Z794 Long term (current) use of insulin: Secondary | ICD-10-CM | POA: Diagnosis not present

## 2023-05-10 DIAGNOSIS — E782 Mixed hyperlipidemia: Secondary | ICD-10-CM | POA: Diagnosis not present

## 2023-05-10 DIAGNOSIS — E119 Type 2 diabetes mellitus without complications: Secondary | ICD-10-CM | POA: Diagnosis not present

## 2023-05-10 MED ORDER — TRESIBA FLEXTOUCH 100 UNIT/ML ~~LOC~~ SOPN: PEN_INJECTOR | SUBCUTANEOUS | 2 refills | Status: DC

## 2023-05-10 MED ORDER — ATORVASTATIN CALCIUM 10 MG PO TABS
10.0000 mg | ORAL_TABLET | Freq: Every day | ORAL | 1 refills | Status: DC
Start: 2023-05-10 — End: 2023-10-07

## 2023-05-10 MED ORDER — GVOKE HYPOPEN 1-PACK 1 MG/0.2ML ~~LOC~~ SOAJ
1.0000 mg | SUBCUTANEOUS | 2 refills | Status: DC | PRN
Start: 2023-05-10 — End: 2024-09-02

## 2023-05-10 NOTE — Progress Notes (Signed)
Outpatient Endocrinology Note [place], Ryan Crane  05/10/23   Ryan Crane 1956/05/25 [id number]  Referring Provider: Joaquim Nam, Ryan Crane Primary Care Provider: Joaquim Nam, Ryan Crane Reason for consultation: Subjective   Assessment & Plan  Diagnoses and all orders for this visit:  Controlled type 2 diabetes mellitus without complication, with long-term current use of insulin (HCC)  Mixed hypercholesterolemia and hypertriglyceridemia    Hemochromatosis induced diabetes  Hba1c goal less than 7.0, current Hba1c is 6.3. Will recommend for the following change of medications to: Humalog 18 units before breakfast,16-18 before lunch and 18 before dinner ac 15 min before meals  Tresiba 26 units daily  No known contraindications to any of above medications Glucagon prescribed today 05/10/23  Hyperlipidemia -Last LDL off goal: 91 -on pravastatin 10 mg QD -changed to atorvastatin 10 gm qd on 05/10/23 -Follow low fat diet and exercise   -Blood pressure goal <140/90 - Microalbumin/creatinine at goal < 30 - on ACE/ARB lisinopril 20 mg qd -diet changes including salt restriction -limit eating outside -counseled BP targets per standards of diabetes care -Uncontrolled blood pressure can lead to retinopathy, nephropathy and cardiovascular and atherosclerotic heart disease  Reviewed and counseled on: -A1C target -Blood sugar targets -Complications of uncontrolled diabetes  -Checking blood sugar before meals and bedtime and bring log next visit -All medications with mechanism of action and side effects -Hypoglycemia management: rule of 15's, Glucagon Emergency Kit and medical alert ID -low-carb low-fat plate-method diet -At least 20 minutes of physical activity per day -Annual dilated retinal eye exam and foot exam -compliance and follow up needs -follow up as scheduled or earlier if problem gets worse  Call if blood sugar is less than 70 or consistently above 250     Take a 15 gm snack of carbohydrate at bedtime before you go to sleep if your blood sugar is less than 100.    If you are going to fast after midnight for a test or procedure, ask your physician for instructions on how to reduce/decrease your insulin dose.    Call if blood sugar is less than 70 or consistently above 250  -Treating a low sugar by rule of 15  (15 gms of sugar every 15 min until sugar is more than 70) If you feel your sugar is low, test your sugar to be sure If your sugar is low (less than 70), then take 15 grams of a fast acting Carbohydrate (3-4 glucose tablets or glucose gel or 4 ounces of juice or regular soda) Recheck your sugar 15 min after treating low to make sure it is more than 70 If sugar is still less than 70, treat again with 15 grams of carbohydrate          Don't drive the hour of hypoglycemia  If unconscious/unable to eat or drink by mouth, use glucagon injection or nasal spray baqsimi and call 911. Can repeat again in 15 min if still unconscious.  Return in about 4 months (around 09/09/2023) for visit + fasting labs before next visit.   I have reviewed current medications, nurse's notes, allergies, vital signs, past medical and surgical history, family medical history, and social history for this encounter. Counseled patient on symptoms, examination findings, lab findings, imaging results, treatment decisions and monitoring and prognosis. The patient understood the recommendations and agrees with the treatment plan. All questions regarding treatment plan were fully answered.  Ryan [employer], Ryan Crane  05/10/23    History of Present Illness Ryan Crane is a 67 y.o. year old male who presents for evaluation of Type 2 diabetes mellitus.  Ryan Crane was first diagnosed in 2013.   Diabetes education +  Home diabetes regimen: Humalog 18 units before breakfast,16-18 before lunch and 18 before dinner ac     Tresiba 26 units daily  Previous  history: Non-insulin hypoglycemic drugs previously used: Metformin, glipizide, Januvia Insulin was started in 2019  COMPLICATIONS -  MI/Stroke -  retinopathy -  neuropathy -  nephropathy  BLOOD SUGAR DATA 65-324  Physical Exam  BP 118/76   Pulse 69   Ht 5\' 8"  (1.727 m)   Wt 173 lb 6.4 oz (78.7 kg)   SpO2 94%   BMI 26.37 kg/m    Constitutional: well developed, well nourished Head: normocephalic, atraumatic Eyes: sclera anicteric, no redness Neck: supple Lungs: normal respiratory effort Neurology: alert and oriented Skin: dry, no appreciable rashes Musculoskeletal: no appreciable defects Psychiatric: normal mood and affect Diabetic Foot Exam - Simple   No data filed      Current Medications Patient's Medications  New Prescriptions   No medications on file  Previous Medications   ACCU-CHEK SOFTCLIX LANCETS LANCETS    1 each by Other route 4 (four) times daily. E11.9   ALBUTEROL (VENTOLIN HFA) 108 (90 BASE) MCG/ACT INHALER    Inhale 2 puffs into the lungs every 6 (six) hours as needed for wheezing or shortness of breath.   ASPIRIN 81 MG TABLET    Take 81 mg by mouth daily.   BLOOD GLUCOSE MONITORING SUPPL (ACCU-CHEK GUIDE ME) W/DEVICE KIT    1 each by Does not apply route 4 (four) times daily. E11.9   CYANOCOBALAMIN (,VITAMIN B-12,) 1000 MCG/ML INJECTION    Inject 1,000 mcg into the muscle every 30 (thirty) days.   DICLOFENAC SODIUM (VOLTAREN) 1 % GEL    Apply 2 g topically 4 (four) times daily as needed.   DOXYCYCLINE (VIBRA-TABS) 100 MG TABLET    Take 1 tablet (100 mg total) by mouth 2 (two) times daily.   FENOFIBRATE 160 MG TABLET    TAKE 1 TABLET BY MOUTH EVERY DAY   FLUTICASONE (FLONASE) 50 MCG/ACT NASAL SPRAY    Place 1 spray into both nostrils daily.   GLUCOSE BLOOD (ONETOUCH VERIO) TEST STRIP    USE TO TEST BLOOD SUGARS AS DIRECTED 7 TIMES A DAY.   INSULIN DEGLUDEC (TRESIBA FLEXTOUCH) 100 UNIT/ML FLEXTOUCH PEN    INJECT 26 UNITS DAILY   INSULIN LISPRO (HUMALOG  KWIKPEN) 100 UNIT/ML KWIKPEN    3 times a day (just before each meal) 16-18-18 units   INSULIN PEN NEEDLE (BD PEN NEEDLE NANO 2ND GEN) 32G X 4 MM MISC    USE 4 TIMES A DAY   INSULIN SYRINGE-NEEDLE U-100 30G X 1/2" 0.5 ML MISC    1 each by Does not apply route daily. E11.9   LANCETS (ONETOUCH DELICA PLUS LANCET30G) MISC    USE TO TEST BLOOD SUGAR 4 TIMES DAILY   LEVOTHYROXINE (SYNTHROID) 137 MCG TABLET    Take 1 tablet (137 mcg total) by mouth daily.   LISINOPRIL (ZESTRIL) 20 MG TABLET    TAKE 1 TABLET BY MOUTH EVERY DAY   OMEPRAZOLE (PRILOSEC) 40 MG CAPSULE    Take 1 capsule (40 mg total) by mouth daily.   ONETOUCH DELICA LANCETS 30G MISC    Use to check BS 7x a day   OXYCODONE-ACETAMINOPHEN (PERCOCET/ROXICET) 5-325 MG TABLET    Take 1-2 tablets  by mouth every 6 (six) hours as needed (for kidney stones).   PRAVASTATIN (PRAVACHOL) 10 MG TABLET    TAKE 1 TABLET BY MOUTH EVERYDAY AT BEDTIME   PROMETHAZINE-DEXTROMETHORPHAN (PROMETHAZINE-DM) 6.25-15 MG/5ML SYRUP    Take 5 mLs by mouth 4 (four) times daily as needed for cough.  Modified Medications   No medications on file  Discontinued Medications   No medications on file    Allergies Allergies  Allergen Reactions   Flomax [Tamsulosin Hcl]     nausea    Past Medical History Past Medical History:  Diagnosis Date   Allergy    Arthritis    Cholelithiasis    symptomatic   Diabetes mellitus (HCC)    Family history of malignant neoplasm of gastrointestinal tract    Fatty liver    GERD (gastroesophageal reflux disease)    Graves disease    s/p ablation   Hemochromatosis    History of chicken pox    History of hiatal hernia    History of kidney stones    Hyperlipidemia    Hypertension    Internal hemorrhoids    Kidney stones     Personal history of colonic polyps 03/31/2008   tubular adenoma   Right fibular fracture 2013    Past Surgical History Past Surgical History:  Procedure Laterality Date   CHOLECYSTECTOMY N/A 05/29/2017    Procedure: LAPAROSCOPIC CHOLECYSTECTOMY;  Surgeon: Berna Bue, Ryan Crane;  Location: [facility] OR;  Service: General;  Laterality: N/A;   COLONOSCOPY W/ BIOPSIES AND POLYPECTOMY     LITHOTRIPSY     URETERAL STENT PLACEMENT     left    Family History family history includes Arthritis in his mother; COPD in his mother; Colon cancer (age of onset: 14) in his maternal grandmother; Diabetes in his mother; Hypertension in his mother; Lung cancer in his father.  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 [employer]   Married 1977   1 daughter   Social Determinants of Health   Financial Resource Strain: Not on file  Food Insecurity: Not on file  Transportation Needs: Not on file  Physical Activity: Not on file  Stress: Not on file  Social Connections: Not on file  Intimate Partner Violence: Not on file    Lab Results  Component Value Date   HGBA1C 6.3 04/22/2023   HGBA1C 6.5 01/10/2023   HGBA1C 6.2 09/11/2022   Lab Results  Component Value Date   CHOL 150 04/29/2023   Lab Results  Component Value Date   HDL 34.90 (L) 04/29/2023   Lab Results  Component Value Date   LDLCALC 92 04/17/2022   Lab Results  Component Value Date   TRIG 210.0 (H) 04/29/2023   Lab Results  Component Value Date   CHOLHDL 4  04/29/2023   Lab Results  Component Value Date   CREATININE 1.17 04/22/2023   Lab Results   Component Value Date   GFR 64.01 04/22/2023   Lab Results  Component Value Date   MICROALBUR <0.7 06/13/2022      Component Value Date/Time   NA 139 04/22/2023 0802   K 4.5 04/22/2023 0802   CL 103 04/22/2023 0802   CO2 27 04/22/2023 0802   GLUCOSE 111 (H) 04/22/2023 0802   GLUCOSE 94 12/17/2006 0845   BUN 20 04/22/2023 0802   CREATININE 1.17 04/22/2023 0802   CREATININE 0.99 09/27/2017 1626   CALCIUM 9.4 04/22/2023 0802   PROT 6.4 04/29/2023 1157   ALBUMIN 4.1 04/29/2023 1157   AST 33 04/29/2023 1157   ALT 22 04/29/2023 1157   ALKPHOS 100 04/29/2023 1157   BILITOT 0.8 04/29/2023 1157   GFRNONAA >60 05/29/2017 0858   GFRAA >60 05/29/2017 0858      Latest Ref Rng & Units 04/22/2023    8:02 AM 01/10/2023    7:56 AM 09/11/2022   10:23 AM  BMP  Glucose 70 - 99 mg/dL 664  403  86   BUN 6 - 23 mg/dL 20  21    Creatinine 4.74 - 1.50 mg/dL 2.59  5.63    Sodium 875 - 145 mEq/L 139  139    Potassium 3.5 - 5.1 mEq/L 4.5  4.1    Chloride 96 - 112 mEq/L 103  103    CO2 19 - 32 mEq/L 27  28    Calcium 8.4 - 10.5 mg/dL 9.4  64.3         Component Value Date/Time   WBC 5.6 04/29/2023 1157   RBC 4.62 04/29/2023 1157   HGB 15.6 04/29/2023 1157   HCT 45.1 04/29/2023 1157   PLT 202.0 04/29/2023 1157   MCV 97.7 04/29/2023 1157   MCH 31.1 09/27/2017 1626   MCHC 34.7 04/29/2023 1157   RDW 12.9 04/29/2023 1157   LYMPHSABS 1.5 04/29/2023 1157   MONOABS 0.6 04/29/2023 1157   EOSABS 0.3 04/29/2023 1157   BASOSABS 0.0 04/29/2023 1157     Parts of this note may have been dictated using voice recognition software. There may be variances in spelling and vocabulary which are unintentional. Not all errors are proofread. Please notify the Thereasa Parkin if any discrepancies are noted or if the meaning of any statement is not clear.

## 2023-05-10 NOTE — Patient Instructions (Signed)

## 2023-05-12 NOTE — Assessment & Plan Note (Signed)
Presumed bronchitis.  Still okay for outpatient follow-up. Continue baseline medications.  Add on doxycycline, sunburn caution.  Rest and fluids.  Sedation caution on the cough syrup.  Rx sent.  He agrees to plan.

## 2023-05-21 ENCOUNTER — Other Ambulatory Visit: Payer: Medicare Other

## 2023-05-28 ENCOUNTER — Encounter: Payer: Medicare Other | Admitting: Family Medicine

## 2023-05-29 ENCOUNTER — Encounter: Payer: Self-pay | Admitting: Internal Medicine

## 2023-05-30 ENCOUNTER — Ambulatory Visit (INDEPENDENT_AMBULATORY_CARE_PROVIDER_SITE_OTHER): Payer: Medicare Other

## 2023-05-30 DIAGNOSIS — E538 Deficiency of other specified B group vitamins: Secondary | ICD-10-CM | POA: Diagnosis not present

## 2023-05-30 MED ORDER — CYANOCOBALAMIN 1000 MCG/ML IJ SOLN
1000.0000 ug | Freq: Once | INTRAMUSCULAR | Status: AC
Start: 2023-05-30 — End: 2023-05-30
  Administered 2023-05-30: 1000 ug via INTRAMUSCULAR

## 2023-05-30 NOTE — Progress Notes (Signed)
Per orders of Dr. Crawford Givens who is out of office and Dr Roxy Manns who is in office, injection of vitamion b 12 given by Lewanda Rife in left deltoid. Patient tolerated injection well. Patient will make appointment for 1 month.

## 2023-06-27 ENCOUNTER — Other Ambulatory Visit: Payer: Self-pay | Admitting: Endocrinology

## 2023-06-27 DIAGNOSIS — E10649 Type 1 diabetes mellitus with hypoglycemia without coma: Secondary | ICD-10-CM

## 2023-07-01 MED ORDER — INSULIN LISPRO (1 UNIT DIAL) 100 UNIT/ML (KWIKPEN)
PEN_INJECTOR | SUBCUTANEOUS | 3 refills | Status: AC
Start: 2023-07-01 — End: ?

## 2023-07-01 NOTE — Progress Notes (Unsigned)
Cardiology Office Note:  .   Date:  07/02/2023  ID:  Ryan Crane, DOB May 05, 1956, MRN [id number] PCP: Ryan Nam, MD  [facility] Providers Cardiologist:  Ryan Crane  Click to update primary MD,subspecialty MD or APP then REFRESH:1}   History of Present Illness: .   Ryan Crane is a 67 y.o. male   Hx of DM, hemachromatosis, graves disease   Was out playing golf this past week  Became very winded walking up the hill  Plays golf every other week Typically uses a cart .  No Cp or pressure Has been gradually increasing over the past several years  Walks every day .  Still working  - is an Art gallery manager at [employer]. hx of cardiac disease Father : heavy smoker,  died of lung cancer  Mother :  died of old age , dementia  at age 2   ROS:   Studies Reviewed: Marland Kitchen   EKG Interpretation Date/Time:  Tuesday July 02 2023 14:51:34 EDT Ventricular Rate:  71 PR Interval:  158 QRS Duration:  78 QT Interval:  370 QTC Calculation: 402 R Axis:   71  Text Interpretation: Normal sinus rhythm Nonspecific T wave abnormality When compared with ECG of 29-May-2017 09:14, Nonspecific T wave abnormality now evident in Anterolateral leads The NS ST changes are new compared to previous ECG in 2018. Confirmed by Kristeen Miss [phone]) on 07/02/2023 3:12:18 PM    EKG Interpretation Date/Time:  Tuesday July 02 2023 14:51:34 EDT Ventricular Rate:  71 PR Interval:  158 QRS Duration:  78 QT Interval:  370 QTC Calculation: 402 R Axis:   71  Text Interpretation: Normal sinus rhythm Nonspecific T wave abnormality When compared with ECG of 29-May-2017 09:14, Nonspecific T wave abnormality now evident in Anterolateral leads The NS ST changes are new compared to previous ECG in 2018. Confirmed by Kristeen Miss [phone]) on 07/02/2023 3:12:18 PM   Risk Assessment/Calculations:             Physical Exam:   VS:  BP 124/70   Pulse 71   Ht 5\' 8"  (1.727 m)   Wt 174 lb 9.6 oz (79.2  kg)   SpO2 95%   BMI 26.55 kg/m    Wt Readings from Last 3 Encounters:  07/02/23 174 lb 9.6 oz (79.2 kg)  05/10/23 173 lb 6.4 oz (78.7 kg)  05/09/23 173 lb (78.5 kg)    GEN: Well nourished, well developed in no acute distress NECK: No JVD; No carotid bruits CARDIAC: RRR, no murmurs, rubs, gallops RESPIRATORY:  Clear to auscultation without rales, wheezing or rhonchi  ABDOMEN: Soft, non-tender, non-distended EXTREMITIES:  No edema; No deformity   ASSESSMENT AND PLAN: .       Shortness of breath with exertion: Ryan Crane presents for further evaluation of shortness of breath with exertion.  This been progressing for several years and became especially severe while he was playing golf a month or so ago with his grandson.  He denies any episodes of chest pain.  His blood pressure is fairly normal.  I would like to get an echocardiogram for further evaluation of his shortness of breath.  Will also get a coronary calcium score to rule out significant coronary artery disease.  Of asked him to let me know if he develops any episodes of chest discomfort.  I will see him again in 3 months for follow-up visit.   2.  Hyperlipidemia: He is on atorvastatin 10 mg  a day.  Continue current medications.    Dispo:  Signed, Kristeen Miss, MD

## 2023-07-02 ENCOUNTER — Ambulatory Visit (INDEPENDENT_AMBULATORY_CARE_PROVIDER_SITE_OTHER): Payer: Medicare Other

## 2023-07-02 ENCOUNTER — Encounter: Payer: Self-pay | Admitting: Cardiovascular Disease

## 2023-07-02 ENCOUNTER — Ambulatory Visit: Payer: Medicare Other | Attending: Cardiovascular Disease | Admitting: Cardiovascular Disease

## 2023-07-02 VITALS — BP 124/70 | HR 71 | Ht 68.0 in | Wt 174.6 lb

## 2023-07-02 DIAGNOSIS — I10 Essential (primary) hypertension: Secondary | ICD-10-CM

## 2023-07-02 DIAGNOSIS — E782 Mixed hyperlipidemia: Secondary | ICD-10-CM | POA: Diagnosis not present

## 2023-07-02 DIAGNOSIS — E538 Deficiency of other specified B group vitamins: Secondary | ICD-10-CM | POA: Diagnosis not present

## 2023-07-02 DIAGNOSIS — R0609 Other forms of dyspnea: Secondary | ICD-10-CM

## 2023-07-02 MED ORDER — CYANOCOBALAMIN 1000 MCG/ML IJ SOLN
1000.0000 ug | Freq: Once | INTRAMUSCULAR | Status: AC
Start: 2023-07-02 — End: 2023-07-02
  Administered 2023-07-02: 1000 ug via INTRAMUSCULAR

## 2023-07-02 NOTE — Patient Instructions (Signed)
Medication Instructions:  Your physician recommends that you continue on your current medications as directed. Please refer to the Current Medication list given to you today.  *If you need a refill on your cardiac medications before your next appointment, please call your pharmacy*   Lab Work: NONE If you have labs (blood work) drawn today and your tests are completely normal, you will receive your results only by: MyChart Message (if you have MyChart) OR A paper copy in the mail If you have any lab test that is abnormal or we need to change your treatment, we will call you to review the results.   Testing/Procedures: ECHO Your physician has requested that you have an echocardiogram. Echocardiography is a painless test that uses sound waves to create images of your heart. It provides your doctor with information about the size and shape of your heart and how well your heart's chambers and valves are working. This procedure takes approximately one hour. There are no restrictions for this procedure. Please do NOT wear cologne, perfume, aftershave, or lotions (deodorant is allowed). Please arrive 15 minutes prior to your appointment time.  Coronary Calcium Score CT Non-Cardiac CT scanning, (CAT scanning), is a noninvasive, special x-ray that produces cross-sectional images of the body using x-rays and a computer. CT scans help physicians diagnose and treat medical conditions. For some CT exams, a contrast material is used to enhance visibility in the area of the body being studied. CT scans provide greater clarity and reveal more details than regular x-ray exams.  Follow-Up: At [facility], you and your health needs are our priority.  As part of our continuing mission to provide you with exceptional heart care, we have created designated Provider Care Teams.  These Care Teams include your primary Cardiologist (physician) and Advanced Practice Providers (APPs -  Physician Assistants and  Nurse Practitioners) who all work together to provide you with the care you need, when you need it.  Your next appointment:   3 month(s)  Provider:   Kristeen Miss, MD

## 2023-07-02 NOTE — Progress Notes (Signed)
Per orders of Dr. Graham Duncan, injection of vitamin b 12 given by Rena Isley in right deltoid. Patient tolerated injection well. Patient will make appointment for 1 month.   

## 2023-07-08 ENCOUNTER — Encounter: Payer: Self-pay | Admitting: Internal Medicine

## 2023-07-08 ENCOUNTER — Ambulatory Visit (HOSPITAL_COMMUNITY): Payer: Medicare Other | Attending: Cardiology

## 2023-07-08 ENCOUNTER — Ambulatory Visit (AMBULATORY_SURGERY_CENTER): Payer: Medicare Other

## 2023-07-08 VITALS — Ht 68.0 in | Wt 174.0 lb

## 2023-07-08 DIAGNOSIS — R0609 Other forms of dyspnea: Secondary | ICD-10-CM

## 2023-07-08 DIAGNOSIS — I10 Essential (primary) hypertension: Secondary | ICD-10-CM | POA: Diagnosis not present

## 2023-07-08 DIAGNOSIS — Z86010 Personal history of colonic polyps: Secondary | ICD-10-CM

## 2023-07-08 DIAGNOSIS — Z1211 Encounter for screening for malignant neoplasm of colon: Secondary | ICD-10-CM

## 2023-07-08 DIAGNOSIS — E782 Mixed hyperlipidemia: Secondary | ICD-10-CM

## 2023-07-08 LAB — ECHOCARDIOGRAM COMPLETE
Area-P 1/2: 3.26 cm2
S' Lateral: 2.65 cm

## 2023-07-08 MED ORDER — NA SULFATE-K SULFATE-MG SULF 17.5-3.13-1.6 GM/177ML PO SOLN
1.0000 | Freq: Once | ORAL | 0 refills | Status: AC
Start: 2023-07-08 — End: 2023-07-08

## 2023-07-08 NOTE — Progress Notes (Signed)
No egg or soy allergy known to patient  No issues known to pt with past sedation with any surgeries or procedures Patient denies ever being told they had issues or difficulty with intubation  No FH of Malignant Hyperthermia Pt is not on diet pills Pt is not on  home 02  Pt is not on blood thinners  Pt denies issues with constipation  No A fib or A flutter Have any cardiac testing pending--no  LOA: independent  Prep: suprep   Patient's chart reviewed by Ryan Crane CNRA prior to previsit and patient appropriate for the [facility].  Previsit completed and red dot placed by patient's name on their procedure day (on provider's schedule).     PV competed with patient. Prep instructions sent via mychart and home address. Goodrx coupon for [facility] provided to use for price reduction if needed.   

## 2023-07-10 ENCOUNTER — Encounter (INDEPENDENT_AMBULATORY_CARE_PROVIDER_SITE_OTHER): Payer: Self-pay

## 2023-07-22 ENCOUNTER — Telehealth: Payer: Self-pay | Admitting: Cardiovascular Disease

## 2023-07-22 ENCOUNTER — Encounter: Payer: Self-pay | Admitting: Family Medicine

## 2023-07-22 NOTE — Telephone Encounter (Signed)
Patient states on MyChart he received a notification regarding a $150 payment and signing a waiver. Please advise.

## 2023-07-24 ENCOUNTER — Telehealth: Payer: Self-pay | Admitting: Internal Medicine

## 2023-07-24 NOTE — Telephone Encounter (Signed)
Pt informed that it will be the 5th day after diagnosis of COVID so as long ashe does not show any worsening s/s or a temp between now and then he can come wearing a mask. He stated that  his wife was diagnosed after him with COVID and she was to bring him. He stated he had o one else who can do it. RN offered days available for reschedule. Pt decided to call back tomorrow after he thinks about it.

## 2023-07-24 NOTE — Telephone Encounter (Signed)
Patient called stated he tested positive for Covid 19 is scheduled for 07/29/23. Please advise.

## 2023-07-29 ENCOUNTER — Encounter: Payer: Self-pay | Admitting: Internal Medicine

## 2023-07-29 ENCOUNTER — Ambulatory Visit (AMBULATORY_SURGERY_CENTER): Payer: Medicare Other | Admitting: Internal Medicine

## 2023-07-29 VITALS — BP 101/69 | HR 69 | Temp 97.8°F | Resp 160 | Ht 68.0 in | Wt 174.0 lb

## 2023-07-29 DIAGNOSIS — Z09 Encounter for follow-up examination after completed treatment for conditions other than malignant neoplasm: Secondary | ICD-10-CM | POA: Diagnosis not present

## 2023-07-29 DIAGNOSIS — Z86010 Personal history of colonic polyps: Secondary | ICD-10-CM

## 2023-07-29 DIAGNOSIS — D120 Benign neoplasm of cecum: Secondary | ICD-10-CM

## 2023-07-29 DIAGNOSIS — C182 Malignant neoplasm of ascending colon: Secondary | ICD-10-CM

## 2023-07-29 DIAGNOSIS — E109 Type 1 diabetes mellitus without complications: Secondary | ICD-10-CM | POA: Diagnosis not present

## 2023-07-29 DIAGNOSIS — K760 Fatty (change of) liver, not elsewhere classified: Secondary | ICD-10-CM | POA: Diagnosis not present

## 2023-07-29 DIAGNOSIS — D122 Benign neoplasm of ascending colon: Secondary | ICD-10-CM | POA: Diagnosis not present

## 2023-07-29 DIAGNOSIS — I10 Essential (primary) hypertension: Secondary | ICD-10-CM | POA: Diagnosis not present

## 2023-07-29 DIAGNOSIS — D124 Benign neoplasm of descending colon: Secondary | ICD-10-CM | POA: Diagnosis not present

## 2023-07-29 DIAGNOSIS — Z1211 Encounter for screening for malignant neoplasm of colon: Secondary | ICD-10-CM

## 2023-07-29 DIAGNOSIS — K635 Polyp of colon: Secondary | ICD-10-CM | POA: Diagnosis not present

## 2023-07-29 MED ORDER — SODIUM CHLORIDE 0.9 % IV SOLN: 500.0000 mL | Freq: Once | INTRAVENOUS | Status: DC

## 2023-07-29 NOTE — Op Note (Signed)
[facility] Patient Name: Ryan Crane Procedure Date: 07/29/2023 8:12 AM MRN: [id number] Endoscopist: Wilhemina Bonito. Marina Goodell , MD, [phone] Age: 67 Referring MD:  Date of Birth: [birth year] Gender: Male Account #: [id number] Procedure:                Colonoscopy with cold snare polypectomy x 6; biopsy                            polypectomy x 1 Indications:              High risk colon cancer surveillance: Personal                            history of non-advanced adenoma (2009); 2013 was                            negative for neoplasia (Dr. Jarold Motto) Medicines:                Monitored Anesthesia Care Procedure:                Pre-Anesthesia Assessment:                           - Prior to the procedure, a History and Physical                            was performed, and patient medications and                            allergies were reviewed. The patient's tolerance of                            previous anesthesia was also reviewed. The risks                            and benefits of the procedure and the sedation                            options and risks were discussed with the patient.                            All questions were answered, and informed consent                            was obtained. Prior Anticoagulants: The patient has                            taken no anticoagulant or antiplatelet agents. ASA                            Grade Assessment: II - A patient with mild systemic                            disease. After reviewing the risks and benefits,  the patient was deemed in satisfactory condition to                            undergo the procedure.                           After obtaining informed consent, the colonoscope                            was passed under direct vision. Throughout the                            procedure, the patient's blood pressure, pulse, and                            oxygen saturations  were monitored continuously. The                            CF [id number] #[id number] was introduced through the anus                            and advanced to the the cecum, identified by                            appendiceal orifice and ileocecal valve. The                            ileocecal valve, appendiceal orifice, and rectum                            were photographed. The quality of the bowel                            preparation was excellent. The colonoscopy was                            performed without difficulty. The patient tolerated                            the procedure well. The bowel preparation used was                            SUPREP via split dose instruction. Scope In: 8:25:22 AM Scope Out: 8:40:21 AM Scope Withdrawal Time: 0 hours 14 minutes 19 seconds  Total Procedure Duration: 0 hours 14 minutes 59 seconds  Findings:                 Six polyps were found in the descending colon,                            ascending colon and cecum. The polyps were 3 to 6                            mm in size. These polyps were removed with  a cold                            snare. Resection and retrieval were complete.                           A less than 1 mm polyp was found in the cecum. The                            polyp was removed with a jumbo cold forceps.                            Resection and retrieval were complete.                           Internal hemorrhoids were found during retroflexion.                           The exam was otherwise without abnormality on                            direct and retroflexion views. Complications:            No immediate complications. Estimated blood loss:                            None. Estimated Blood Loss:     Estimated blood loss: none. Impression:               - Six 3 to 6 mm polyps in the descending colon, in                            the ascending colon and in the cecum, removed with                            a  cold snare. Resected and retrieved.                           - One less than 1 mm polyp in the cecum, removed                            with a jumbo cold forceps. Resected and retrieved.                           - Internal hemorrhoids.                           - The examination was otherwise normal on direct                            and retroflexion views. Recommendation:           - Repeat colonoscopy in 3 years for surveillance.                           -  Patient has a contact number available for                            emergencies. The signs and symptoms of potential                            delayed complications were discussed with the                            patient. Return to normal activities tomorrow.                            Written discharge instructions were provided to the                            patient.                           - Resume previous diet.                           - Continue present medications.                           - Await pathology results. Wilhemina Bonito. Marina Goodell, MD 07/29/2023 8:52:01 AM This report has been signed electronically.

## 2023-07-29 NOTE — Progress Notes (Signed)
Pt resting comfortably. VSS. Airway intact. SBAR complete to RN. All questions answered.   

## 2023-07-29 NOTE — Progress Notes (Signed)
Called to room to assist during endoscopic procedure.  Patient ID and intended procedure confirmed with present staff. Received instructions for my participation in the procedure from the performing physician.  

## 2023-07-29 NOTE — Progress Notes (Signed)
Pt's states no medical or surgical changes since previsit or office visit. 

## 2023-07-29 NOTE — Progress Notes (Signed)
HISTORY OF PRESENT ILLNESS:  Ryan Crane is a 67 y.o. male with a history of nonadvanced adenoma.  Previous examinations 2009, 2013.  Now for follow-up  REVIEW OF SYSTEMS:  All non-GI ROS negative except for  Past Medical History:  Diagnosis Date   Allergy    Arthritis    Cholelithiasis    symptomatic   Diabetes mellitus (HCC)    Family history of malignant neoplasm of gastrointestinal tract    Fatty liver    GERD (gastroesophageal reflux disease)    Graves disease    s/p ablation   Hemochromatosis    History of chicken pox    History of hiatal hernia    History of kidney stones    Hyperlipidemia    Hypertension    Internal hemorrhoids    Kidney stones     Personal history of colonic polyps 03/31/2008   tubular adenoma   Right fibular fracture 2013    Past Surgical History:  Procedure Laterality Date   CHOLECYSTECTOMY N/A 05/29/2017   Procedure: LAPAROSCOPIC CHOLECYSTECTOMY;  Surgeon: Berna Bue, MD;  Location: [facility] OR;  Service: General;  Laterality: N/A;   COLONOSCOPY W/ BIOPSIES AND POLYPECTOMY     LITHOTRIPSY     URETERAL STENT PLACEMENT     left    Social History Ryan Crane  reports that he has never smoked. He has never used smokeless tobacco. He reports that he does not drink alcohol and does not use drugs.  family history includes Arthritis in his mother; COPD in his mother; Colon cancer (age of onset: 29) in his maternal grandmother; Diabetes in his mother; Hypertension in his mother; Lung cancer in his father.  Allergies  Allergen Reactions   Flomax [Tamsulosin Hcl]     nausea       PHYSICAL EXAMINATION: Vital signs: BP 127/83   Pulse (!) 57   Temp 97.8 F (36.6 C)   Resp 17   Ht 5\' 8"  (1.727 m)   Wt 174 lb (78.9 kg)   SpO2 98%   BMI 26.46 kg/m  General: Well-developed, well-nourished, no acute distress HEENT: Sclerae are anicteric, conjunctiva pink. Oral mucosa intact Lungs: Clear Heart: Regular Abdomen: soft, nontender,  nondistended, no obvious ascites, no peritoneal signs, normal bowel sounds. No organomegaly. Extremities: No edema Psychiatric: alert and oriented x3. Cooperative     ASSESSMENT:  History of adenomatous colon polyp   PLAN:   Surveillance colonoscopy

## 2023-07-29 NOTE — Patient Instructions (Addendum)
-  Handout on polyps and hemorrhoids provided -await pathology results -repeat colonoscopy 3 years for surveillance recommended. -Continue present medications   YOU HAD AN ENDOSCOPIC PROCEDURE TODAY AT THE [facility]:   Refer to the procedure report that was given to you for any specific questions about what was found during the examination.  If the procedure report does not answer your questions, please call your gastroenterologist to clarify.  If you requested that your care partner not be given the details of your procedure findings, then the procedure report has been included in a sealed envelope for you to review at your convenience later.  YOU SHOULD EXPECT: Some feelings of bloating in the abdomen. Passage of more gas than usual.  Walking can help get rid of the air that was put into your GI tract during the procedure and reduce the bloating. If you had a lower endoscopy (such as a colonoscopy or flexible sigmoidoscopy) you may notice spotting of blood in your stool or on the toilet paper. If you underwent a bowel prep for your procedure, you may not have a normal bowel movement for a few days.  Please Note:  You might notice some irritation and congestion in your nose or some drainage.  This is from the oxygen used during your procedure.  There is no need for concern and it should clear up in a day or so.  SYMPTOMS TO REPORT IMMEDIATELY:  Following lower endoscopy (colonoscopy or flexible sigmoidoscopy):  Excessive amounts of blood in the stool  Significant tenderness or worsening of abdominal pains  Swelling of the abdomen that is new, acute  Fever of 100F or higher   For urgent or emergent issues, a gastroenterologist can be reached at any hour by calling [phone]. Do not use MyChart messaging for urgent concerns.    DIET:  We do recommend a small meal at first, but then you may proceed to your regular diet.  Drink plenty of fluids but you should avoid alcoholic  beverages for 24 hours.  ACTIVITY:  You should plan to take it easy for the rest of today and you should NOT DRIVE or use heavy machinery until tomorrow (because of the sedation medicines used during the test).    FOLLOW UP: Our staff will call the number listed on your records the next business day following your procedure.  We will call around 7:15- 8:00 am to check on you and address any questions or concerns that you may have regarding the information given to you following your procedure. If we do not reach you, we will leave a message.     If any biopsies were taken you will be contacted by phone or by letter within the next 1-3 weeks.  Please call us at [phone] if you have not heard about the biopsies in 3 weeks.    SIGNATURES/CONFIDENTIALITY: You and/or your care partner have signed paperwork which will be entered into your electronic medical record.  These signatures attest to the fact that that the information above on your After Visit Summary has been reviewed and is understood.  Full responsibility of the confidentiality of this discharge information lies with you and/or your care-partner.

## 2023-07-30 ENCOUNTER — Telehealth: Payer: Self-pay | Admitting: *Deleted

## 2023-07-30 NOTE — Telephone Encounter (Signed)
  Follow up Call-     07/29/2023    7:29 AM  Call back number  Post procedure Call Back phone  # [phone]  Permission to leave phone message Yes     Patient questions:  Do you have a fever, pain , or abdominal swelling? No. Pain Score  0 *  Have you tolerated food without any problems? Yes.    Have you been able to return to your normal activities? Yes.    Do you have any questions about your discharge instructions: Diet   No. Medications  No. Follow up visit  No.  Do you have questions or concerns about your Care? No.  Actions: * If pain score is 4 or above: No action needed, pain <4.

## 2023-08-01 ENCOUNTER — Encounter: Payer: Self-pay | Admitting: Internal Medicine

## 2023-08-03 ENCOUNTER — Other Ambulatory Visit: Payer: Self-pay | Admitting: Family Medicine

## 2023-08-05 DIAGNOSIS — E119 Type 2 diabetes mellitus without complications: Secondary | ICD-10-CM | POA: Diagnosis not present

## 2023-08-05 DIAGNOSIS — I10 Essential (primary) hypertension: Secondary | ICD-10-CM | POA: Diagnosis not present

## 2023-08-05 DIAGNOSIS — H2513 Age-related nuclear cataract, bilateral: Secondary | ICD-10-CM | POA: Diagnosis not present

## 2023-08-05 DIAGNOSIS — H52223 Regular astigmatism, bilateral: Secondary | ICD-10-CM | POA: Diagnosis not present

## 2023-08-05 DIAGNOSIS — H524 Presbyopia: Secondary | ICD-10-CM | POA: Diagnosis not present

## 2023-08-05 DIAGNOSIS — H5212 Myopia, left eye: Secondary | ICD-10-CM | POA: Diagnosis not present

## 2023-08-05 DIAGNOSIS — H35033 Hypertensive retinopathy, bilateral: Secondary | ICD-10-CM | POA: Diagnosis not present

## 2023-08-05 DIAGNOSIS — H5201 Hypermetropia, right eye: Secondary | ICD-10-CM | POA: Diagnosis not present

## 2023-08-05 DIAGNOSIS — H53143 Visual discomfort, bilateral: Secondary | ICD-10-CM | POA: Diagnosis not present

## 2023-08-05 LAB — HM DIABETES EYE EXAM

## 2023-08-06 ENCOUNTER — Ambulatory Visit (INDEPENDENT_AMBULATORY_CARE_PROVIDER_SITE_OTHER): Payer: Medicare Other

## 2023-08-06 DIAGNOSIS — E538 Deficiency of other specified B group vitamins: Secondary | ICD-10-CM | POA: Diagnosis not present

## 2023-08-06 MED ORDER — CYANOCOBALAMIN 1000 MCG/ML IJ SOLN
1000.0000 ug | Freq: Once | INTRAMUSCULAR | Status: AC
Start: 2023-08-06 — End: 2023-08-06
  Administered 2023-08-06: 1000 ug via INTRAMUSCULAR

## 2023-08-06 NOTE — Progress Notes (Signed)
Per orders of Dr. Crawford Givens, injection of vitaminm b 12 given by Lewanda Rife in left deltoid. Patient tolerated injection well. Patient will make appointment for 1 month.

## 2023-08-19 ENCOUNTER — Encounter: Payer: Self-pay | Admitting: "Endocrinology

## 2023-08-20 ENCOUNTER — Ambulatory Visit
Admission: RE | Admit: 2023-08-20 | Discharge: 2023-08-20 | Disposition: A | Discharge status: Home / Self Care | Payer: No Typology Code available for payment source | Source: Ambulatory Visit | Attending: Cardiovascular Disease | Admitting: Cardiovascular Disease

## 2023-08-20 DIAGNOSIS — R0609 Other forms of dyspnea: Secondary | ICD-10-CM

## 2023-08-20 DIAGNOSIS — E782 Mixed hyperlipidemia: Secondary | ICD-10-CM

## 2023-08-20 DIAGNOSIS — I10 Essential (primary) hypertension: Secondary | ICD-10-CM

## 2023-08-30 ENCOUNTER — Telehealth: Payer: Self-pay

## 2023-08-30 DIAGNOSIS — R0609 Other forms of dyspnea: Secondary | ICD-10-CM

## 2023-08-30 DIAGNOSIS — Z01810 Encounter for preprocedural cardiovascular examination: Secondary | ICD-10-CM

## 2023-08-30 MED ORDER — METOPROLOL TARTRATE 100 MG PO TABS: ORAL_TABLET | ORAL | 0 refills | Status: DC

## 2023-08-30 NOTE — Telephone Encounter (Signed)
Called and spoke with patient who agrees to Coronary CTA. Order placed at this time, understands he will be called to schedule. Directions sent via MyChart. Metoprolol sent to pharmacy on file for HR control. BMET ordered and scheduled for 09/06/23.

## 2023-08-30 NOTE — Addendum Note (Signed)
Addended by: Lars Mage on: 08/30/2023 12:43 PM   Modules accepted: Orders

## 2023-08-30 NOTE — Telephone Encounter (Signed)
-----   Message from Kristeen Miss sent at 08/27/2023  5:27 PM EDT ----- CAC score is 1064 ( 88th percentile for age / sex matched controls )   Very large hiatal hernia   His dyspnea could be an angina equivalent or might be due to the large hiatal hernia in his chest   Lets get a coronary CTA for further evaluation of his DOE .

## 2023-09-02 ENCOUNTER — Other Ambulatory Visit (HOSPITAL_COMMUNITY): Payer: Self-pay | Admitting: *Deleted

## 2023-09-02 ENCOUNTER — Telehealth: Payer: Self-pay | Admitting: Cardiovascular Disease

## 2023-09-02 DIAGNOSIS — Z01810 Encounter for preprocedural cardiovascular examination: Secondary | ICD-10-CM | POA: Diagnosis not present

## 2023-09-02 DIAGNOSIS — R0609 Other forms of dyspnea: Secondary | ICD-10-CM

## 2023-09-02 NOTE — Telephone Encounter (Signed)
Pt given his date and time of his cardiac CT for his clarification.

## 2023-09-02 NOTE — Telephone Encounter (Signed)
New message:      Patient needs to know what is the date of his test or procedure that is scheduled for this month?t

## 2023-09-03 ENCOUNTER — Other Ambulatory Visit (INDEPENDENT_AMBULATORY_CARE_PROVIDER_SITE_OTHER): Payer: Medicare Other

## 2023-09-03 DIAGNOSIS — Z794 Long term (current) use of insulin: Secondary | ICD-10-CM

## 2023-09-03 DIAGNOSIS — E119 Type 2 diabetes mellitus without complications: Secondary | ICD-10-CM

## 2023-09-03 LAB — LIPID PANEL
Cholesterol: 133 mg/dL (ref 0–200)
HDL: 40.8 mg/dL (ref >39.00)
LDL Cholesterol: 72 mg/dL (ref 0–99)
NonHDL: 92.28
Total CHOL/HDL Ratio: 3
Triglycerides: 101 mg/dL (ref 0.0–149.0)
VLDL: 20.2 mg/dL (ref 0.0–40.0)

## 2023-09-03 LAB — COMPREHENSIVE METABOLIC PANEL
ALT: 20 U/L (ref 0–53)
AST: 30 U/L (ref 0–37)
Albumin: 4.1 g/dL (ref 3.5–5.2)
Alkaline Phosphatase: 71 U/L (ref 39–117)
BUN: 18 mg/dL (ref 6–23)
CO2: 27 mEq/L (ref 19–32)
Calcium: 9.3 mg/dL (ref 8.4–10.5)
Chloride: 104 mEq/L (ref 96–112)
Creatinine, Ser: 1.08 mg/dL (ref 0.40–1.50)
GFR: 70.28 mL/min (ref >60.00)
Glucose, Bld: 119 mg/dL — ABNORMAL HIGH (ref 70–99)
Potassium: 4.3 mEq/L (ref 3.5–5.1)
Sodium: 139 mEq/L (ref 135–145)
Total Bilirubin: 1.1 mg/dL (ref 0.2–1.2)
Total Protein: 6.4 g/dL (ref 6.0–8.3)

## 2023-09-03 LAB — BASIC METABOLIC PANEL
BUN/Creatinine Ratio: 21 (ref 10–24)
BUN: 22 mg/dL (ref 8–27)
CO2: 21 mmol/L (ref 20–29)
Calcium: 10 mg/dL (ref 8.6–10.2)
Chloride: 103 mmol/L (ref 96–106)
Creatinine, Ser: 1.04 mg/dL (ref 0.76–1.27)
Glucose: 128 mg/dL — ABNORMAL HIGH (ref 70–99)
Potassium: 4.4 mmol/L (ref 3.5–5.2)
Sodium: 139 mmol/L (ref 134–144)
eGFR: 78 mL/min/{1.73_m2} (ref >59)

## 2023-09-03 LAB — HEMOGLOBIN A1C: Hgb A1c MFr Bld: 6.6 % — ABNORMAL HIGH (ref 4.6–6.5)

## 2023-09-03 LAB — MICROALBUMIN / CREATININE URINE RATIO
Creatinine,U: 146.8 mg/dL
Microalb Creat Ratio: 1.5 mg/g (ref 0.0–30.0)
Microalb, Ur: 2.2 mg/dL — ABNORMAL HIGH (ref 0.0–1.9)

## 2023-09-03 LAB — LAB REPORT - SCANNED: EGFR: 78

## 2023-09-04 ENCOUNTER — Encounter (HOSPITAL_COMMUNITY): Payer: Self-pay

## 2023-09-06 ENCOUNTER — Ambulatory Visit: Payer: Medicare Other

## 2023-09-09 ENCOUNTER — Ambulatory Visit (HOSPITAL_BASED_OUTPATIENT_CLINIC_OR_DEPARTMENT_OTHER)
Admission: RE | Admit: 2023-09-09 | Discharge: 2023-09-09 | Disposition: A | Discharge status: Home / Self Care | Payer: Medicare Other | Source: Ambulatory Visit | Attending: Cardiovascular Disease | Admitting: Cardiovascular Disease

## 2023-09-09 ENCOUNTER — Other Ambulatory Visit: Payer: Self-pay | Admitting: Cardiovascular Disease

## 2023-09-09 ENCOUNTER — Ambulatory Visit: Payer: Medicare Other | Admitting: "Endocrinology

## 2023-09-09 ENCOUNTER — Encounter: Payer: Self-pay | Admitting: "Endocrinology

## 2023-09-09 ENCOUNTER — Ambulatory Visit (HOSPITAL_COMMUNITY)
Admission: RE | Admit: 2023-09-09 | Discharge: 2023-09-09 | Disposition: A | Discharge status: Home / Self Care | Payer: Medicare Other | Source: Ambulatory Visit | Attending: Cardiovascular Disease | Admitting: Cardiovascular Disease

## 2023-09-09 VITALS — BP 117/63 | HR 67 | Ht 68.0 in | Wt 173.8 lb

## 2023-09-09 DIAGNOSIS — I2089 Other forms of angina pectoris: Secondary | ICD-10-CM | POA: Diagnosis present

## 2023-09-09 DIAGNOSIS — E119 Type 2 diabetes mellitus without complications: Secondary | ICD-10-CM | POA: Insufficient documentation

## 2023-09-09 DIAGNOSIS — I2510 Atherosclerotic heart disease of native coronary artery without angina pectoris: Secondary | ICD-10-CM

## 2023-09-09 DIAGNOSIS — E782 Mixed hyperlipidemia: Secondary | ICD-10-CM | POA: Diagnosis not present

## 2023-09-09 DIAGNOSIS — E10649 Type 1 diabetes mellitus with hypoglycemia without coma: Secondary | ICD-10-CM

## 2023-09-09 DIAGNOSIS — Z794 Long term (current) use of insulin: Secondary | ICD-10-CM | POA: Diagnosis not present

## 2023-09-09 DIAGNOSIS — E785 Hyperlipidemia, unspecified: Secondary | ICD-10-CM | POA: Diagnosis not present

## 2023-09-09 DIAGNOSIS — R931 Abnormal findings on diagnostic imaging of heart and coronary circulation: Secondary | ICD-10-CM | POA: Insufficient documentation

## 2023-09-09 DIAGNOSIS — Z01810 Encounter for preprocedural cardiovascular examination: Secondary | ICD-10-CM | POA: Insufficient documentation

## 2023-09-09 DIAGNOSIS — R079 Chest pain, unspecified: Secondary | ICD-10-CM | POA: Diagnosis not present

## 2023-09-09 DIAGNOSIS — R0609 Other forms of dyspnea: Secondary | ICD-10-CM | POA: Diagnosis not present

## 2023-09-09 DIAGNOSIS — I25118 Atherosclerotic heart disease of native coronary artery with other forms of angina pectoris: Secondary | ICD-10-CM | POA: Insufficient documentation

## 2023-09-09 DIAGNOSIS — K449 Diaphragmatic hernia without obstruction or gangrene: Secondary | ICD-10-CM | POA: Insufficient documentation

## 2023-09-09 MED ORDER — NITROGLYCERIN 0.4 MG SL SUBL
0.8000 mg | SUBLINGUAL_TABLET | Freq: Once | SUBLINGUAL | Status: AC
  Administered 2023-09-09: 0.8 mg via SUBLINGUAL

## 2023-09-09 MED ORDER — IOHEXOL 350 MG/ML SOLN
95.0000 mL | Freq: Once | INTRAVENOUS | Status: AC | PRN
  Administered 2023-09-09: 95 mL via INTRAVENOUS

## 2023-09-09 MED ORDER — INSULIN LISPRO (1 UNIT DIAL) 100 UNIT/ML (KWIKPEN)
PEN_INJECTOR | SUBCUTANEOUS | 3 refills | Status: DC
Start: 2023-09-09 — End: 2024-02-10

## 2023-09-09 MED ORDER — NITROGLYCERIN 0.4 MG SL SUBL
SUBLINGUAL_TABLET | SUBLINGUAL | Status: AC
  Filled 2023-09-09: qty 2

## 2023-09-09 NOTE — Progress Notes (Signed)
17

## 2023-09-09 NOTE — Progress Notes (Addendum)
Outpatient Endocrinology Note Ryan [place], MD  09/09/23   Ryan Crane [birth year] [id number]  Referring Provider: Joaquim Nam, MD Primary Care Provider: Joaquim Nam, MD Reason for consultation: Subjective   Assessment & Plan  Diagnoses and all orders for this visit:  Long-term insulin use (HCC)  Type 1 diabetes mellitus with hypoglycemia and without coma (HCC) -     insulin lispro (HUMALOG KWIKPEN) 100 UNIT/ML KwikPen; Humalog 14 units before breakfast, 16 before lunch and 16 before dinner ac  Mixed hypercholesterolemia and hypertriglyceridemia   Hemochromatosis induced diabetes  Hba1c goal less than 7.0, current Hba1c is 6.6. Will recommend for the following change of medications to: Humalog 14 units before breakfast, 16 before lunch and 16 before dinner ac     Tresiba 26 units daily  No known contraindications to any of above medications Glucagon prescribed today 05/10/23  Hyperlipidemia -Last LDL off goal: 72 -on pravastatin 10 mg QD -cpt will start atorvastatin 10 mg qd on 09/09/23 -Follow low fat diet and exercise   -Blood pressure goal <140/90 - Microalbumin/creatinine at goal < 30 - on ACE/ARB lisinopril 20 mg qd -diet changes including salt restriction -limit eating outside -counseled BP targets per standards of diabetes care -Uncontrolled blood pressure can lead to retinopathy, nephropathy and cardiovascular and atherosclerotic heart disease  Reviewed and counseled on: -A1C target -Blood sugar targets -Complications of uncontrolled diabetes  -Checking blood sugar before meals and bedtime and bring log next visit -All medications with mechanism of action and side effects -Hypoglycemia management: rule of 15's, Glucagon Emergency Kit and medical alert ID -low-carb low-fat plate-method diet -At least 20 minutes of physical activity per day -Annual dilated retinal eye exam and foot exam -compliance and follow up needs -follow up as  scheduled or earlier if problem gets worse  Call if blood sugar is less than 70 or consistently above 250    Take a 15 gm snack of carbohydrate at bedtime before you go to sleep if your blood sugar is less than 100.    If you are going to fast after midnight for a test or procedure, ask your physician for instructions on how to reduce/decrease your insulin dose.    Call if blood sugar is less than 70 or consistently above 250  -Treating a low sugar by rule of 15  (15 gms of sugar every 15 min until sugar is more than 70) If you feel your sugar is low, test your sugar to be sure If your sugar is low (less than 70), then take 15 grams of a fast acting Carbohydrate (3-4 glucose tablets or glucose gel or 4 ounces of juice or regular soda) Recheck your sugar 15 min after treating low to make sure it is more than 70 If sugar is still less than 70, treat again with 15 grams of carbohydrate          Don't drive the hour of hypoglycemia  If unconscious/unable to eat or drink by mouth, use glucagon injection or nasal spray baqsimi and call 911. Can repeat again in 15 min if still unconscious.  Return in about 4 weeks (around 10/07/2023).   I have reviewed current medications, nurse's notes, allergies, vital signs, past medical and surgical history, family medical history, and social history for this encounter. Counseled patient on symptoms, examination findings, lab findings, imaging results, treatment decisions and monitoring and prognosis. The patient understood the recommendations and agrees with the treatment plan. All questions regarding treatment  plan were fully answered.  Ryan Layhill, MD  09/09/23    History of Present Illness Ryan Crane is a 67 y.o. year old male who presents for evaluation of Type 2 diabetes mellitus.  Ryan Crane was first diagnosed in 2013.   Diabetes education +  Home diabetes regimen: Humalog 16 units before breakfast, 18 before lunch and 18 before  dinner ac     Tresiba 26 units daily  Previous history: Non-insulin hypoglycemic drugs previously used: Metformin, glipizide, Januvia Insulin was started in 2019  COMPLICATIONS -  MI/Stroke -  retinopathy -  neuropathy -  nephropathy  BLOOD SUGAR DATA 69-200  Physical Exam  BP 117/63   Pulse 67   Ht 5\' 8"  (1.727 m)   Wt 173 lb 12.8 oz (78.8 kg)   SpO2 98%   BMI 26.43 kg/m    Constitutional: well developed, well nourished Head: normocephalic, atraumatic Eyes: sclera anicteric, no redness Neck: supple Lungs: normal respiratory effort Neurology: alert and oriented Skin: dry, no appreciable rashes Musculoskeletal: no appreciable defects Psychiatric: normal mood and affect Diabetic Foot Exam - Simple   No data filed      Current Medications Patient's Medications  New Prescriptions   No medications on file  Previous Medications   ACCU-CHEK SOFTCLIX LANCETS LANCETS    1 each by Other route 4 (four) times daily. E11.9   ALBUTEROL (VENTOLIN HFA) 108 (90 BASE) MCG/ACT INHALER    Inhale 2 puffs into the lungs every 6 (six) hours as needed for wheezing or shortness of breath.   ASPIRIN 81 MG TABLET    Take 81 mg by mouth daily.   ATORVASTATIN (LIPITOR) 10 MG TABLET    Take 1 tablet (10 mg total) by mouth daily.   BLOOD GLUCOSE MONITORING SUPPL (ACCU-CHEK GUIDE ME) W/DEVICE KIT    1 each by Does not apply route 4 (four) times daily. E11.9   CYANOCOBALAMIN (,VITAMIN B-12,) 1000 MCG/ML INJECTION    Inject 1,000 mcg into the muscle every 30 (thirty) days.   DICLOFENAC SODIUM (VOLTAREN) 1 % GEL    Apply 2 g topically 4 (four) times daily as needed.   DOXYCYCLINE (VIBRA-TABS) 100 MG TABLET    Take 1 tablet (100 mg total) by mouth 2 (two) times daily.   FENOFIBRATE 160 MG TABLET    TAKE 1 TABLET BY MOUTH EVERY DAY   FLUTICASONE (FLONASE) 50 MCG/ACT NASAL SPRAY    Place 1 spray into both nostrils daily.   GLUCAGON (GVOKE HYPOPEN 1-PACK) 1 MG/0.2ML SOAJ    Inject 1 mg into the skin  as needed (low blood sugar with impaired consciousness).   GLUCOSE BLOOD (ONETOUCH VERIO) TEST STRIP    USE TO TEST BLOOD SUGARS AS DIRECTED 7 TIMES A DAY.   INSULIN DEGLUDEC (TRESIBA FLEXTOUCH) 100 UNIT/ML FLEXTOUCH PEN    INJECT 26 UNITS DAILY   INSULIN PEN NEEDLE (BD PEN NEEDLE NANO 2ND GEN) 32G X 4 MM MISC    USE AS DIRECTED 4 TIMES A DAY   INSULIN SYRINGE-NEEDLE U-100 30G X 1/2" 0.5 ML MISC    1 each by Does not apply route daily. E11.9   LANCETS (ONETOUCH DELICA PLUS LANCET30G) MISC    USE TO TEST BLOOD SUGAR 4 TIMES DAILY   LEVOTHYROXINE (SYNTHROID) 137 MCG TABLET    TAKE 1 TABLET BY MOUTH EVERY DAY   LISINOPRIL (ZESTRIL) 20 MG TABLET    TAKE 1 TABLET BY MOUTH EVERY DAY   METOPROLOL TARTRATE (LOPRESSOR) 100 MG  TABLET    Take 1 tablet by mouth two hours prior to scan   OMEPRAZOLE (PRILOSEC) 40 MG CAPSULE    Take 1 capsule (40 mg total) by mouth daily.   ONETOUCH DELICA LANCETS 30G MISC    Use to check BS 7x a day   OXYCODONE-ACETAMINOPHEN (PERCOCET/ROXICET) 5-325 MG TABLET    Take 1-2 tablets by mouth every 6 (six) hours as needed (for kidney stones).   PRAVASTATIN (PRAVACHOL) 10 MG TABLET    Take 10 mg by mouth at bedtime.  Modified Medications   Modified Medication Previous Medication   INSULIN LISPRO (HUMALOG KWIKPEN) 100 UNIT/ML KWIKPEN insulin lispro (HUMALOG KWIKPEN) 100 UNIT/ML KwikPen      Humalog 14 units before breakfast, 16 before lunch and 16 before dinner ac    3 times a day (just before each meal) 16-18-18 units  Discontinued Medications   No medications on file    Allergies Allergies  Allergen Reactions   Flomax [Tamsulosin Hcl]     nausea    Past Medical History Past Medical History:  Diagnosis Date   Allergy    Arthritis    Cholelithiasis    symptomatic   Diabetes mellitus (HCC)    Family history of malignant neoplasm of gastrointestinal tract    Fatty liver    GERD (gastroesophageal reflux disease)    Graves disease    s/p ablation   Hemochromatosis     History of chicken pox    History of hiatal hernia    History of kidney stones    Hyperlipidemia    Hypertension    Internal hemorrhoids    Kidney stones     Personal history of colonic polyps 03/31/2008   tubular adenoma   Right fibular fracture 2013    Past Surgical History Past Surgical History:  Procedure Laterality Date   CHOLECYSTECTOMY N/A 05/29/2017   Procedure: LAPAROSCOPIC CHOLECYSTECTOMY;  Surgeon: Berna Bue, MD;  Location: [facility] OR;  Service: General;  Laterality: N/A;   COLONOSCOPY W/ BIOPSIES AND POLYPECTOMY     LITHOTRIPSY     URETERAL STENT PLACEMENT     left    Family History family history includes Arthritis in his mother; COPD in his mother; Colon cancer (age of onset: 42) in his maternal grandmother; Diabetes in his mother; Hypertension in his mother; Lung cancer in his father.  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 [employer]   Married 1977   1 daughter   Social Determinants of Health   Financial Resource Strain: Not on file  Food Insecurity: Not on file  Transportation Needs: Not on file  Physical Activity: Not on file  Stress: Not on file  Social Connections: Not on file  Intimate Partner Violence: Not on file    Lab  Results  Component Value Date   HGBA1C 6.6 (H) 09/03/2023   HGBA1C 6.3 04/22/2023   HGBA1C 6.5 01/10/2023   Lab Results  Component Value  Date   CHOL 133 09/03/2023   Lab Results  Component Value Date   HDL 40.80 09/03/2023   Lab Results  Component Value Date   LDLCALC 72 09/03/2023   Lab Results  Component Value Date   TRIG 101.0 09/03/2023   Lab Results  Component Value Date   CHOLHDL 3 09/03/2023   Lab Results  Component Value Date   CREATININE 1.08 09/03/2023   Lab Results  Component Value Date   GFR 70.28 09/03/2023   Lab Results  Component Value Date   MICROALBUR 2.2 (H) 09/03/2023      Component Value Date/Time   NA 139 09/03/2023 0813   NA 139 09/02/2023 0951   K 4.3 09/03/2023 0813   CL 104 09/03/2023 0813   CO2 27 09/03/2023 0813   GLUCOSE 119 (H) 09/03/2023 0813   GLUCOSE 94 12/17/2006 0845   BUN 18 09/03/2023 0813   BUN 22 09/02/2023 0951   CREATININE 1.08 09/03/2023 0813   CREATININE 0.99 09/27/2017 1626   CALCIUM 9.3 09/03/2023 0813   PROT 6.4 09/03/2023 0813   ALBUMIN 4.1 09/03/2023 0813   AST 30 09/03/2023 0813   ALT 20 09/03/2023 0813   ALKPHOS 71 09/03/2023 0813   BILITOT 1.1 09/03/2023 0813   GFRNONAA >60 05/29/2017 0858   GFRAA >60 05/29/2017 0858      Latest Ref Rng & Units 09/03/2023    8:13 AM 09/02/2023    9:51 AM 04/22/2023    8:02 AM  BMP  Glucose 70 - 99 mg/dL 956  213  086   BUN 6 - 23 mg/dL 18  22  20    Creatinine 0.40 - 1.50 mg/dL 5.78  4.69  6.29   BUN/Creat Ratio 10 - 24  21    Sodium 135 - 145 mEq/L 139  139  139   Potassium 3.5 - 5.1 mEq/L 4.3  4.4  4.5   Chloride 96 - 112 mEq/L 104  103  103   CO2 19 - 32 mEq/L 27  21  27    Calcium 8.4 - 10.5 mg/dL 9.3  52.8  9.4        Component Value Date/Time   WBC 5.6 04/29/2023 1157   RBC 4.62 04/29/2023 1157   HGB 15.6 04/29/2023 1157   HCT 45.1 04/29/2023 1157   PLT 202.0 04/29/2023 1157   MCV 97.7 04/29/2023 1157   MCH 31.1 09/27/2017 1626   MCHC 34.7 04/29/2023 1157   RDW 12.9 04/29/2023 1157   LYMPHSABS 1.5 04/29/2023 1157   MONOABS 0.6 04/29/2023 1157   EOSABS 0.3 04/29/2023 1157   BASOSABS  0.0 04/29/2023 1157     Parts of this note may have been dictated using voice recognition software. There may be variances in spelling and vocabulary which are unintentional. Not all errors are proofread. Please notify the There[facility] Parkin if any discrepancies are noted or if the meaning of any statement is not clear.

## 2023-09-09 NOTE — Patient Instructions (Signed)

## 2023-09-10 ENCOUNTER — Ambulatory Visit (INDEPENDENT_AMBULATORY_CARE_PROVIDER_SITE_OTHER): Payer: Medicare Other

## 2023-09-10 DIAGNOSIS — E538 Deficiency of other specified B group vitamins: Secondary | ICD-10-CM | POA: Diagnosis not present

## 2023-09-10 MED ORDER — CYANOCOBALAMIN 1000 MCG/ML IJ SOLN
1000.0000 ug | Freq: Once | INTRAMUSCULAR | Status: AC
Start: 2023-09-10 — End: 2023-09-10
  Administered 2023-09-10: 1000 ug via INTRAMUSCULAR

## 2023-09-10 NOTE — Progress Notes (Signed)
Per orders of Dr. Crawford Givens, injection of vitamin b 12 given by Lewanda Rife in left deltoid. Pts request to get in LD. Patient tolerated injection well. Patient will make appointment for 1 month.

## 2023-09-13 ENCOUNTER — Encounter: Payer: Self-pay | Admitting: Family Medicine

## 2023-10-06 NOTE — Progress Notes (Unsigned)
Cardiology Office Note:  .   Date:  10/07/2023  ID:  Ryan Crane, DOB [birth year], MRN [id number] PCP: Ryan Nam, MD  [facility] Providers Cardiologist:  Ryan Crane  Click to update primary MD,subspecialty MD or APP then REFRESH:1}   History of Present Illness: .   Ryan Crane is a 67 y.o. male   Hx of DM, hemachromatosis, graves disease   Was out playing golf this past week  Became very winded walking up the hill  Plays golf every other week Typically uses a cart .  No Cp or pressure Has been gradually increasing over the past several years  Walks every day .  Still working  - is an Art gallery manager at [employer]. hx of cardiac disease Father : heavy smoker,  died of lung cancer  Mother :  died of old age , dementia  at age 39    Oct. 28, 2024  Ryan Crane is seen for follow up of his hemachromatosis, graves disease  Echo on July 08, 2023 reveals hyperdynamic LV function with EF 70-75% Trivial MR , mild Aortic sclerosis, no AS Mild TR, trivial PI  Coronary CTA : CAC score is 972 ( 87th percentile for age / sex matched conrols) RCA :  mild plaque LAD :  mild calcified plaque LCx:  non dominant, mild plaque    Was having some DOE when it was hot but he is doing better now [place] on occasion     ROS:   Studies Reviewed: .            Risk Assessment/Calculations:          Physical Exam:     Physical Exam: Blood pressure 138/80, pulse 64, height 5\' 8"  (1.727 m), weight 169 lb 9.6 oz (76.9 kg), SpO2 97%.       GEN:  Well nourished, well developed in no acute distress HEENT: Normal NECK: No JVD; No carotid bruits LYMPHATICS: No lymphadenopathy CARDIAC: RRR , no murmurs, rubs, gallops RESPIRATORY:  Clear to auscultation without rales, wheezing or rhonchi  ABDOMEN: Soft, non-tender, non-distended MUSCULOSKELETAL:  No edema; No deformity  SKIN: Warm and dry NEUROLOGIC:  Alert and oriented x 3   ASSESSMENT AND PLAN: .         Shortness of breath with exertion:    seems to be better now that the weather is cooler     2.  Hyperlipidemia:    his last LDL is 72. On atorvastatin 10 mg a day  Will increase atorvastatin to 20 mg ,  check lipids and ALT in 3 months   Will have him see [place] in a year .       Dispo:  Signed, Kristeen Miss, MD

## 2023-10-07 ENCOUNTER — Encounter: Payer: Self-pay | Admitting: Cardiovascular Disease

## 2023-10-07 ENCOUNTER — Ambulatory Visit: Payer: Medicare Other | Admitting: "Endocrinology

## 2023-10-07 ENCOUNTER — Ambulatory Visit: Payer: Medicare Other | Attending: Cardiovascular Disease | Admitting: Cardiovascular Disease

## 2023-10-07 VITALS — BP 138/80 | HR 64 | Ht 68.0 in | Wt 169.6 lb

## 2023-10-07 DIAGNOSIS — I10 Essential (primary) hypertension: Secondary | ICD-10-CM | POA: Diagnosis not present

## 2023-10-07 DIAGNOSIS — E785 Hyperlipidemia, unspecified: Secondary | ICD-10-CM | POA: Diagnosis not present

## 2023-10-07 MED ORDER — ATORVASTATIN CALCIUM 20 MG PO TABS: 20.0000 mg | ORAL_TABLET | Freq: Every day | ORAL | 3 refills | Status: DC

## 2023-10-07 NOTE — Patient Instructions (Addendum)
Medication Instructions:  INCREASE Atorvastatin 20mg  daily *If you need a refill on your cardiac medications before your next appointment, please call your pharmacy*  Lab Work: Lipids, ALT in 3 months If you have labs (blood work) drawn today and your tests are completely normal, you will receive your results only by: MyChart Message (if you have MyChart) OR A paper copy in the mail If you have any lab test that is abnormal or we need to change your treatment, we will call you to review the results.  Follow-Up: At [facility], you and your health needs are our priority.  As part of our continuing mission to provide you with exceptional heart care, we have created designated Provider Care Teams.  These Care Teams include your primary Cardiologist (physician) and Advanced Practice Providers (APPs -  Physician Assistants and Nurse Practitioners) who all work together to provide you with the care you need, when you need it.  Your next appointment:   1 year(s)  Provider:   Kristeen Miss, MD

## 2023-10-08 ENCOUNTER — Ambulatory Visit: Payer: Medicare Other | Admitting: "Endocrinology

## 2023-10-08 ENCOUNTER — Encounter: Payer: Self-pay | Admitting: "Endocrinology

## 2023-10-08 VITALS — BP 115/60 | HR 67 | Ht 68.0 in | Wt 173.4 lb

## 2023-10-08 DIAGNOSIS — E11649 Type 2 diabetes mellitus with hypoglycemia without coma: Secondary | ICD-10-CM

## 2023-10-08 DIAGNOSIS — Z794 Long term (current) use of insulin: Secondary | ICD-10-CM

## 2023-10-08 DIAGNOSIS — E7800 Pure hypercholesterolemia, unspecified: Secondary | ICD-10-CM | POA: Diagnosis not present

## 2023-10-08 NOTE — Progress Notes (Signed)
Outpatient Endocrinology Note Ryan Rio Lucio, MD  10/08/23   Ryan Crane [birth year] [id number]  Referring Provider: Joaquim Nam, MD Primary Care Provider: Joaquim Nam, MD Reason for consultation: Subjective   Assessment & Plan  Diagnoses and all orders for this visit:  Uncontrolled type 2 diabetes mellitus with hypoglycemia without coma (HCC)  Long-term insulin use (HCC)  Pure hypercholesterolemia   Hemochromatosis induced diabetes  Hba1c goal less than 7.0, current Hba1c is 6.6. Will recommend for the following change of medications to: Humalog 14 units before breakfast, 16 before lunch and 16 before dinner ac 15 min before meals (take 4 units less if eating salad/fish) Tresiba 26 units daily qhs  No known contraindications to any of above medications Glucagon prescribed 05/10/23  Hyperlipidemia -Last LDL off goal: 72 -on atorvastatin 20 mg qd  -Follow low fat diet and exercise   -Blood pressure goal <140/90 - Microalbumin/creatinine at goal < 30 - on ACE/ARB lisinopril 20 mg qd -diet changes including salt restriction -limit eating outside -counseled BP targets per standards of diabetes care -Uncontrolled blood pressure can lead to retinopathy, nephropathy and cardiovascular and atherosclerotic heart disease  Reviewed and counseled on: -A1C target -Blood sugar targets -Complications of uncontrolled diabetes  -Checking blood sugar before meals and bedtime and bring log next visit -All medications with mechanism of action and side effects -Hypoglycemia management: rule of 15's, Glucagon Emergency Kit and medical alert ID -low-carb low-fat plate-method diet -At least 20 minutes of physical activity per day -Annual dilated retinal eye exam and foot exam -compliance and follow up needs -follow up as scheduled or earlier if problem gets worse  Call if blood sugar is less than 70 or consistently above 250    Take a 15 gm snack of carbohydrate  at bedtime before you go to sleep if your blood sugar is less than 100.    If you are going to fast after midnight for a test or procedure, ask your physician for instructions on how to reduce/decrease your insulin dose.    Call if blood sugar is less than 70 or consistently above 250  -Treating a low sugar by rule of 15  (15 gms of sugar every 15 min until sugar is more than 70) If you feel your sugar is low, test your sugar to be sure If your sugar is low (less than 70), then take 15 grams of a fast acting Carbohydrate (3-4 glucose tablets or glucose gel or 4 ounces of juice or regular soda) Recheck your sugar 15 min after treating low to make sure it is more than 70 If sugar is still less than 70, treat again with 15 grams of carbohydrate          Don't drive the hour of hypoglycemia  If unconscious/unable to eat or drink by mouth, use glucagon injection or nasal spray baqsimi and call 911. Can repeat again in 15 min if still unconscious.  Return in about 4 months (around 02/07/2024).   I have reviewed current medications, nurse's notes, allergies, vital signs, past medical and surgical history, family medical history, and social history for this encounter. Counseled patient on symptoms, examination findings, lab findings, imaging results, treatment decisions and monitoring and prognosis. The patient understood the recommendations and agrees with the treatment plan. All questions regarding treatment plan were fully answered.  Ryan , MD  10/08/23    History of Present Illness Ryan Crane is a 67 y.o. year old male who  presents for evaluation of Type 2 diabetes mellitus.  Ryan Crane was first diagnosed in 2013.   Diabetes education +  Home diabetes regimen: Humalog 16 units before breakfast, 18 before lunch and 18 before dinner ac     Tresiba 26 units daily  Previous history: Non-insulin hypoglycemic drugs previously used: Metformin, glipizide, Januvia Insulin was  started in 2019  COMPLICATIONS -  MI/Stroke -  retinopathy -  neuropathy -  nephropathy  BLOOD SUGAR DATA 44-212 tidac  Physical Exam  BP 115/60   Pulse 67   Ht 5\' 8"  (1.727 m)   Wt 173 lb 6.4 oz (78.7 kg)   SpO2 97%   BMI 26.37 kg/m    Constitutional: well developed, well nourished Head: normocephalic, atraumatic Eyes: sclera anicteric, no redness Neck: supple Lungs: normal respiratory effort Neurology: alert and oriented Skin: dry, no appreciable rashes Musculoskeletal: no appreciable defects Psychiatric: normal mood and affect Diabetic Foot Exam - Simple   No data filed      Current Medications Patient's Medications  New Prescriptions   No medications on file  Previous Medications   ACCU-CHEK SOFTCLIX LANCETS LANCETS    1 each by Other route 4 (four) times daily. E11.9   ALBUTEROL (VENTOLIN HFA) 108 (90 BASE) MCG/ACT INHALER    Inhale 2 puffs into the lungs every 6 (six) hours as needed for wheezing or shortness of breath.   ASPIRIN 81 MG TABLET    Take 81 mg by mouth daily.   ATORVASTATIN (LIPITOR) 20 MG TABLET    Take 1 tablet (20 mg total) by mouth daily.   BLOOD GLUCOSE MONITORING SUPPL (ACCU-CHEK GUIDE ME) W/DEVICE KIT    1 each by Does not apply route 4 (four) times daily. E11.9   CYANOCOBALAMIN (,VITAMIN B-12,) 1000 MCG/ML INJECTION    Inject 1,000 mcg into the muscle every 30 (thirty) days.   DICLOFENAC SODIUM (VOLTAREN) 1 % GEL    Apply 2 g topically 4 (four) times daily as needed.   DOXYCYCLINE (VIBRA-TABS) 100 MG TABLET    Take 1 tablet (100 mg total) by mouth 2 (two) times daily.   FENOFIBRATE 160 MG TABLET    TAKE 1 TABLET BY MOUTH EVERY DAY   FLUTICASONE (FLONASE) 50 MCG/ACT NASAL SPRAY    Place 1 spray into both nostrils daily.   GLUCAGON (GVOKE HYPOPEN 1-PACK) 1 MG/0.2ML SOAJ    Inject 1 mg into the skin as needed (low blood sugar with impaired consciousness).   GLUCOSE BLOOD (ONETOUCH VERIO) TEST STRIP    USE TO TEST BLOOD SUGARS AS DIRECTED 7  TIMES A DAY.   INSULIN DEGLUDEC (TRESIBA FLEXTOUCH) 100 UNIT/ML FLEXTOUCH PEN    INJECT 26 UNITS DAILY   INSULIN LISPRO (HUMALOG KWIKPEN) 100 UNIT/ML KWIKPEN    Humalog 14 units before breakfast, 16 before lunch and 16 before dinner ac   INSULIN PEN NEEDLE (BD PEN NEEDLE NANO 2ND GEN) 32G X 4 MM MISC    USE AS DIRECTED 4 TIMES A DAY   INSULIN SYRINGE-NEEDLE U-100 30G X 1/2" 0.5 ML MISC    1 each by Does not apply route daily. E11.9   LANCETS (ONETOUCH DELICA PLUS LANCET30G) MISC    USE TO TEST BLOOD SUGAR 4 TIMES DAILY   LEVOTHYROXINE (SYNTHROID) 137 MCG TABLET    TAKE 1 TABLET BY MOUTH EVERY DAY   LISINOPRIL (ZESTRIL) 20 MG TABLET    TAKE 1 TABLET BY MOUTH EVERY DAY   OMEPRAZOLE (PRILOSEC) 40 MG CAPSULE  Take 1 capsule (40 mg total) by mouth daily.   ONETOUCH DELICA LANCETS 30G MISC    Use to check BS 7x a day   OXYCODONE-ACETAMINOPHEN (PERCOCET/ROXICET) 5-325 MG TABLET    Take 1-2 tablets by mouth every 6 (six) hours as needed (for kidney stones).  Modified Medications   No medications on file  Discontinued Medications   No medications on file    Allergies Allergies  Allergen Reactions   Flomax [Tamsulosin Hcl]     nausea    Past Medical History Past Medical History:  Diagnosis Date   Allergy    Arthritis    Cholelithiasis    symptomatic   Diabetes mellitus (HCC)    Family history of malignant neoplasm of gastrointestinal tract    Fatty liver    GERD (gastroesophageal reflux disease)    Graves disease    s/p ablation   Hemochromatosis    History of chicken pox    History of hiatal hernia    History of kidney stones    Hyperlipidemia    Hypertension    Internal hemorrhoids    Kidney stones     Personal history of colonic polyps 03/31/2008   tubular adenoma   Right fibular fracture 2013    Past Surgical History Past Surgical History:  Procedure Laterality Date   CHOLECYSTECTOMY N/A 05/29/2017   Procedure: LAPAROSCOPIC CHOLECYSTECTOMY;  Surgeon: Berna Bue,  MD;  Location: [facility] OR;  Service: General;  Laterality: N/A;   COLONOSCOPY W/ BIOPSIES AND POLYPECTOMY     LITHOTRIPSY     URETERAL STENT PLACEMENT     left    Family History family history includes Arthritis in his mother; COPD in his mother; Colon cancer (age of onset: 32) in his maternal grandmother; Diabetes in his mother; Hypertension in his mother; Lung cancer in his father.  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 [employer]   Married 1977   1 daughter   Social Determinants of Health   Financial Resource Strain: Not on file  Food Insecurity: Not on file  Transportation Needs: Not on file  Physical Activity: Not on file  Stress: Not on file  Social Connections: Not on file  Intimate Partner Violence: Not on file    Lab Results  Component Value Date   HGBA1C 6.6 (H) 09/03/2023   HGBA1C 6.3 04/22/2023   HGBA1C 6.5 01/10/2023   Lab Results  Component Value Date   CHOL 133 09/03/2023   Lab Results  Component Value Date   HDL 40.80 09/03/2023   Lab Results  Component Value Date   LDLCALC 72 09/03/2023   Lab Results  Component Value Date   TRIG 101.0 09/03/2023   Lab Results  Component Value Date   CHOLHDL 3 09/03/2023  Lab Results  Component Value Date   CREATININE 1.08 09/03/2023   Lab Results  Component Value Date   GFR 70.28 09/03/2023   Lab Results  Component Value  Date   MICROALBUR 2.2 (H) 09/03/2023      Component Value Date/Time   NA 139 09/03/2023 0813   NA 139 09/02/2023 0951   K 4.3 09/03/2023 0813   CL 104 09/03/2023 0813   CO2 27 09/03/2023 0813   GLUCOSE 119 (H) 09/03/2023 0813   GLUCOSE 94 12/17/2006 0845   BUN 18 09/03/2023 0813   BUN 22 09/02/2023 0951   CREATININE 1.08 09/03/2023 0813   CREATININE 0.99 09/27/2017 1626   CALCIUM 9.3 09/03/2023 0813   PROT 6.4 09/03/2023 0813   ALBUMIN 4.1 09/03/2023 0813   AST 30 09/03/2023 0813   ALT 20 09/03/2023 0813   ALKPHOS 71 09/03/2023 0813   BILITOT 1.1 09/03/2023 0813   GFRNONAA >60 05/29/2017 0858   GFRAA >60 05/29/2017 0858      Latest Ref Rng & Units 09/03/2023    8:13 AM 09/02/2023    9:51 AM 04/22/2023    8:02 AM  BMP  Glucose 70 - 99 mg/dL 409  811  914   BUN 6 - 23 mg/dL 18  22  20    Creatinine 0.40 - 1.50 mg/dL 7.82  9.56  2.13   BUN/Creat Ratio 10 - 24  21    Sodium 135 - 145 mEq/L 139  139  139   Potassium 3.5 - 5.1 mEq/L 4.3  4.4  4.5   Chloride 96 - 112 mEq/L 104  103  103   CO2 19 - 32 mEq/L 27  21  27    Calcium 8.4 - 10.5 mg/dL 9.3  08.6  9.4        Component Value Date/Time   WBC 5.6 04/29/2023 1157   RBC 4.62 04/29/2023 1157   HGB 15.6 04/29/2023 1157   HCT 45.1 04/29/2023 1157   PLT 202.0 04/29/2023 1157   MCV 97.7 04/29/2023 1157   MCH 31.1 09/27/2017 1626   MCHC 34.7 04/29/2023 1157   RDW 12.9 04/29/2023 1157   LYMPHSABS 1.5 04/29/2023 1157   MONOABS 0.6 04/29/2023 1157   EOSABS 0.3 04/29/2023 1157   BASOSABS 0.0 04/29/2023 1157     Parts of this note may have been dictated using voice recognition software. There may be variances in spelling and vocabulary which are unintentional. Not all errors are proofread. Please notify the Thereasa Parkin if any discrepancies are noted or if the meaning of any statement is not clear.

## 2023-10-08 NOTE — Patient Instructions (Signed)

## 2023-10-09 ENCOUNTER — Other Ambulatory Visit: Payer: Self-pay | Admitting: Family Medicine

## 2023-10-09 DIAGNOSIS — K449 Diaphragmatic hernia without obstruction or gangrene: Secondary | ICD-10-CM

## 2023-10-11 ENCOUNTER — Encounter: Payer: Self-pay | Admitting: *Deleted

## 2023-10-15 ENCOUNTER — Ambulatory Visit (INDEPENDENT_AMBULATORY_CARE_PROVIDER_SITE_OTHER): Payer: Medicare Other

## 2023-10-15 DIAGNOSIS — E538 Deficiency of other specified B group vitamins: Secondary | ICD-10-CM | POA: Diagnosis not present

## 2023-10-15 MED ORDER — CYANOCOBALAMIN 1000 MCG/ML IJ SOLN
1000.0000 ug | Freq: Once | INTRAMUSCULAR | Status: AC
Start: 2023-10-15 — End: 2023-10-15
  Administered 2023-10-15: 1000 ug via INTRAMUSCULAR

## 2023-10-15 NOTE — Progress Notes (Signed)
Patient presented for B 12 injection given by Makinzy Cleere, CMA to right deltoid, patient voiced no concerns nor showed any signs of distress during injection.  

## 2023-10-22 DIAGNOSIS — K449 Diaphragmatic hernia without obstruction or gangrene: Secondary | ICD-10-CM | POA: Diagnosis not present

## 2023-11-19 ENCOUNTER — Ambulatory Visit (INDEPENDENT_AMBULATORY_CARE_PROVIDER_SITE_OTHER): Payer: Medicare Other

## 2023-11-19 DIAGNOSIS — E538 Deficiency of other specified B group vitamins: Secondary | ICD-10-CM | POA: Diagnosis not present

## 2023-11-19 MED ORDER — CYANOCOBALAMIN 1000 MCG/ML IJ SOLN
1000.0000 ug | Freq: Once | INTRAMUSCULAR | Status: AC
Start: 2023-11-19 — End: 2023-11-19
  Administered 2023-11-19: 1000 ug via INTRAMUSCULAR

## 2023-11-19 NOTE — Progress Notes (Signed)
Per orders of Dr. Elsie Stain, injection of B12 given by Pat Kocher in left deltoid. Patient tolerated injection well. Patient will make appointment for 1 month.

## 2023-11-21 ENCOUNTER — Telehealth: Payer: Self-pay

## 2023-11-21 NOTE — Progress Notes (Addendum)
08/19/2023  8:04 AM - Hendricks, Cayarel L, CMA  Component Value Flag Ref Range Units Status  HM Diabetic Eye Exam No Retinopathy  No Retinopathy  Final  Comment:  Tomasita Morrow Specialist   Lyla Son Abstracted report under Media from 08/05/23 , this is all we know , what's the question ?

## 2023-12-16 ENCOUNTER — Other Ambulatory Visit: Payer: Self-pay | Admitting: Family Medicine

## 2023-12-19 ENCOUNTER — Ambulatory Visit (INDEPENDENT_AMBULATORY_CARE_PROVIDER_SITE_OTHER): Payer: Medicare Other

## 2023-12-19 DIAGNOSIS — E538 Deficiency of other specified B group vitamins: Secondary | ICD-10-CM

## 2023-12-19 MED ORDER — CYANOCOBALAMIN 1000 MCG/ML IJ SOLN
1000.0000 ug | Freq: Once | INTRAMUSCULAR | Status: AC
Start: 2023-12-19 — End: 2023-12-19
  Administered 2023-12-19: 1000 ug via INTRAMUSCULAR

## 2023-12-19 NOTE — Progress Notes (Signed)
 Per orders of Dr. Crawford Givens, injection of vitamin b 12 given by Lewanda Rife in right deltoid. Patient tolerated injection well. Patient will make appointment for 1 month.

## 2023-12-23 NOTE — Telephone Encounter (Signed)
 duplicate

## 2024-01-04 ENCOUNTER — Encounter: Payer: Self-pay | Admitting: Cardiovascular Disease

## 2024-01-07 DIAGNOSIS — E785 Hyperlipidemia, unspecified: Secondary | ICD-10-CM | POA: Diagnosis not present

## 2024-01-07 LAB — LIPID PANEL
Chol/HDL Ratio: 3.4 {ratio} (ref 0.0–5.0)
Cholesterol, Total: 157 mg/dL (ref 100–199)
HDL: 46 mg/dL (ref >39)
LDL Chol Calc (NIH): 89 mg/dL (ref 0–99)
Triglycerides: 121 mg/dL (ref 0–149)
VLDL Cholesterol Cal: 22 mg/dL (ref 5–40)

## 2024-01-07 LAB — ALT: ALT: 25 [IU]/L (ref 0–44)

## 2024-01-16 ENCOUNTER — Telehealth: Payer: Self-pay

## 2024-01-16 DIAGNOSIS — Z79899 Other long term (current) drug therapy: Secondary | ICD-10-CM

## 2024-01-16 DIAGNOSIS — E782 Mixed hyperlipidemia: Secondary | ICD-10-CM

## 2024-01-16 MED ORDER — ATORVASTATIN CALCIUM 40 MG PO TABS: 40.0000 mg | ORAL_TABLET | Freq: Every day | ORAL | 3 refills | Status: DC

## 2024-01-16 NOTE — Telephone Encounter (Signed)
-----   Message from Ryan Crane sent at 01/10/2024  8:33 AM EST ----- CAC score is 972 LDL goal is < 70 His LDL is 89  On Atorvastatin  20 mg a day , fenofibrate  160 Please increase Atorvastatin  to 40 mg a day , Add Zetia 10 mg every day Check lipids , ALT , BMP in 3 months

## 2024-01-16 NOTE — Telephone Encounter (Signed)
 Called and spoke with patient who agrees to plan to increase Lipitor to 40mg  daily. He wants to hold off on adding an additional medication (zetia) at this time. He states his LDL was much better last fall when he was more active-denies dietary changes-but willing to increase strength of Lipitor and exercise more. Repeat labs ordered to be done in 3 months, then willing to consider additional zetia if needed.

## 2024-01-22 ENCOUNTER — Ambulatory Visit (INDEPENDENT_AMBULATORY_CARE_PROVIDER_SITE_OTHER): Payer: Medicare Other

## 2024-01-22 DIAGNOSIS — E538 Deficiency of other specified B group vitamins: Secondary | ICD-10-CM

## 2024-01-22 MED ORDER — CYANOCOBALAMIN 1000 MCG/ML IJ SOLN
1000.0000 ug | Freq: Once | INTRAMUSCULAR | Status: AC
Start: 2024-01-22 — End: 2024-01-22
  Administered 2024-01-22: 1000 ug via INTRAMUSCULAR

## 2024-01-22 NOTE — Progress Notes (Signed)
Per orders of Dr. Loleta Books is out of office and Dr Eustaquio Boyden who is in office injection of vitamin b 12 given by Lewanda Rife in left deltoid.Patient tolerated injection well. Patient will make appointment for 1 month.

## 2024-01-25 ENCOUNTER — Other Ambulatory Visit: Payer: Self-pay | Admitting: Family Medicine

## 2024-02-06 ENCOUNTER — Other Ambulatory Visit: Payer: Self-pay

## 2024-02-06 ENCOUNTER — Telehealth: Payer: Self-pay | Admitting: "Endocrinology

## 2024-02-06 DIAGNOSIS — E11649 Type 2 diabetes mellitus with hypoglycemia without coma: Secondary | ICD-10-CM

## 2024-02-06 MED ORDER — ONETOUCH DELICA LANCETS 33G MISC
3 refills | Status: DC
Start: 2024-02-06 — End: 2024-10-09

## 2024-02-06 MED ORDER — ONETOUCH VERIO VI STRP
ORAL_STRIP | 3 refills | Status: DC
Start: 2024-02-06 — End: 2024-10-09

## 2024-02-06 MED ORDER — ONETOUCH VERIO REFLECT W/DEVICE KIT
PACK | 0 refills | Status: DC
Start: 2024-02-06 — End: 2024-10-09

## 2024-02-06 NOTE — Telephone Encounter (Signed)
 MEDICATION:  One Touch Verio Meter  PHARMACY:  [id number] - [place], [place] - [address] (Ph: [phone])   HAS THE PATIENT CONTACTED THEIR PHARMACY?  Yes  IS THIS A 90 DAY SUPPLY :   IS PATIENT OUT OF MEDICATION:   IF NOT; HOW MUCH IS LEFT:   LAST APPOINTMENT DATE: @10 /29/2024  NEXT APPOINTMENT DATE:@3 /02/2024  DO WE HAVE YOUR PERMISSION TO LEAVE A DETAILED MESSAGE?:Yes  OTHER COMMENTS: Patient states that the meter that he has now is the same one he has had since 2016 and he does not know how often it is supposed to be changed.   **Let patient know to contact pharmacy at the end of the day to make sure medication is ready. **  ** Please notify patient to allow 48-72 hours to process**  **Encourage patient to contact the pharmacy for refills or they can request refills through [facility]**

## 2024-02-10 ENCOUNTER — Encounter: Payer: Self-pay | Admitting: "Endocrinology

## 2024-02-10 ENCOUNTER — Ambulatory Visit: Payer: Medicare Other | Admitting: "Endocrinology

## 2024-02-10 DIAGNOSIS — E782 Mixed hyperlipidemia: Secondary | ICD-10-CM

## 2024-02-10 DIAGNOSIS — E11649 Type 2 diabetes mellitus with hypoglycemia without coma: Secondary | ICD-10-CM

## 2024-02-10 DIAGNOSIS — Z794 Long term (current) use of insulin: Secondary | ICD-10-CM

## 2024-02-10 LAB — POCT GLYCOSYLATED HEMOGLOBIN (HGB A1C): Hemoglobin A1C: 7.2 % — AB (ref 4.0–5.6)

## 2024-02-10 MED ORDER — INSULIN LISPRO (1 UNIT DIAL) 100 UNIT/ML (KWIKPEN): PEN_INJECTOR | SUBCUTANEOUS | 3 refills | Status: DC

## 2024-02-10 MED ORDER — DEXCOM G7 SENSOR MISC
1.0000 | 0 refills | Status: DC
Start: 2024-02-10 — End: 2024-05-21

## 2024-02-10 MED ORDER — TRESIBA FLEXTOUCH 100 UNIT/ML ~~LOC~~ SOPN
PEN_INJECTOR | SUBCUTANEOUS | 5 refills | Status: DC
Start: 2024-02-10 — End: 2024-06-09

## 2024-02-10 NOTE — Patient Instructions (Addendum)
 Will recommend for the following change of medications to: Humalog 14 units before breakfast, 15 before lunch and 16 before dinner ac 15 min before meals (take 4 units less if eating salad/fish) Tresiba 24 units daily at bedtime  ________________ Goals of DM therapy:  Morning Fasting blood sugar: 80-140  Blood sugar before meals: 80-140 Bed time blood sugar: 100-150  A1C <7%, limited only by hypoglycemia  1.Diabetes medications and their side effects discussed, including hypoglycemia    2. Check blood glucose:  a) Always check blood sugars before driving. Please see below (under hypoglycemia) on how to manage b) Check a minimum of 3 times/day or more as needed when having symptoms of hypoglycemia.   c) Try to check blood glucose before sleeping/in the middle of the night to ensure that it is remaining stable and not dropping less than 100 d) Check blood glucose more often if sick  3. Diet: a) 3 meals per day schedule b: Restrict carbs to 60-70 grams (4 servings) per meal c) Colorful vegetables - 3 servings a day, and low sugar fruit 2 servings/day Plate control method: 1/4 plate protein, 1/4 starch, 1/2 green, yellow, or red vegetables d) Avoid carbohydrate snacks unless hypoglycemic episode, or increased physical activity  4. Regular exercise as tolerated, preferably 3 or more hours a week  5. Hypoglycemia: a)  Do not drive or operate machinery without first testing blood glucose to assure it is over 90 mg%, or if dizzy, lightheaded, not feeling normal, etc, or  if foot or leg is numb or weak. b)  If blood glucose less than 70, take four 5gm Glucose tabs or 15-30 gm Glucose gel.  Repeat every 15 min as needed until blood sugar is >100 mg/dl. If hypoglycemia persists then call 911.   6. Sick day management: a) Check blood glucose more often b) Continue usual therapy if blood sugars are elevated.   7. Contact the doctor immediately if blood glucose is frequently <60 mg/dl, or an  episode of severe hypoglycemia occurs (where someone had to give you glucose/  glucagon or if you passed out from a low blood glucose), or if blood glucose is persistently >350 mg/dl, for further management  8. A change in level of physical activity or exercise and a change in diet may also affect your blood sugar. Check blood sugars more often and call if needed.  Instructions: 1. Bring glucose meter, blood glucose records on every visit for review 2. Continue to follow up with primary care physician and other providers for medical care 3. Yearly eye  and foot exam 4. Please get blood work done prior to the next appointment

## 2024-02-10 NOTE — Progress Notes (Signed)
 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  Long-term insulin use (HCC)  Mixed hypercholesterolemia and hypertriglyceridemia   Hemochromatosis induced diabetes  Hba1c goal less than 7.0, current Hba1c is 7.2 Will recommend for the following change of medications to: Humalog 14 units before breakfast, 15 before lunch and 16 before dinner ac 15 min before meals (take 4 units less if eating salad/fish) Tresiba 24 units daily qhs Not interested in CGM-sent Dexcom G7 for a try   No known contraindications to any of above medications Glucagon prescribed 05/10/23  Hyperlipidemia -Last LDL off goal: 89 -on atorvastatin 40 mg every day, fenofibrate 160 mg qd -Follow low fat diet and exercise   -Blood pressure goal <140/90 - Microalbumin/creatinine at goal < 30 - on ACE/ARB lisinopril 20 mg qd -diet changes including salt restriction -limit eating outside -counseled BP targets per standards of diabetes care -Uncontrolled blood pressure can lead to retinopathy, nephropathy and cardiovascular and atherosclerotic heart disease  Reviewed and counseled on: -A1C target -Blood sugar targets -Complications of uncontrolled diabetes  -Checking blood sugar before meals and bedtime and bring log next visit -All medications with  mechanism of action and side effects -Hypoglycemia management: rule of 15's, Glucagon Emergency Kit and medical alert ID -low-carb low-fat plate-method diet -At least 20 minutes of physical activity per day -Annual dilated retinal eye exam and foot exam -compliance and follow up needs -follow up as scheduled or earlier if problem gets worse  Call if blood sugar is less than 70 or consistently above 250    Take a 15 gm snack of carbohydrate at bedtime before you go to sleep if your blood sugar is less than 100.    If you are going to fast after midnight for a test or procedure, ask your physician for instructions on how to reduce/decrease your insulin dose.    Call if blood sugar is less than 70 or consistently above 250  -Treating a low sugar by rule of 15  (15 gms of sugar every 15 min until sugar is more than 70) If you feel your sugar is low, test your sugar to be sure If your sugar is low (less than 70), then take 15 grams of a fast acting Carbohydrate (3-4 glucose tablets or glucose gel or 4 ounces of juice or regular soda) Recheck your sugar 15 min after treating low to make sure it is more than 70 If sugar is still less than 70, treat again with 15 grams of carbohydrate          Don't drive the hour of hypoglycemia  If unconscious/unable to eat or drink by mouth, use glucagon injection or nasal spray baqsimi and call 911. Can repeat again in 15 min if still unconscious.  Return in about 3 months (around 05/12/2024).  I have reviewed current medications, nurse's notes, allergies, vital signs, past medical and surgical history, family medical history, and social history for this encounter. Counseled patient on symptoms, examination findings, lab findings, imaging results, treatment decisions and monitoring and prognosis. The patient understood the recommendations and agrees with the treatment plan. All questions regarding treatment plan were fully answered.  Ryan Crane , MD   02/10/24    History of Present Illness LOTUS GOVER is a 68 y.o. year old male who presents for evaluation of Hemochromatosis induced diabetes.  Westin Knotts Freet was first diagnosed in 2013.   Diabetes education +  Home diabetes regimen: Humalog 16 units before breakfast, 18 before lunch and 18 before dinner ac     Tresiba 26 units daily  Previous history: Non-insulin hypoglycemic drugs previously used: Metformin, glipizide, Januvia Insulin was started in 2019  COMPLICATIONS -  MI/Stroke -  retinopathy -  neuropathy -  nephropathy  BLOOD SUGAR DATA 56-216 four times a day  Physical Exam  BP 130/70 (BP Location: Right Arm, Patient Position: Sitting)   Pulse 61   Ht 5\' 8"  (1.727 m)   Wt 172 lb 9.6 oz (78.3 kg)   SpO2 98%   BMI 26.24 kg/m    Constitutional: well developed, well nourished Head: normocephalic, atraumatic Eyes: sclera anicteric, no redness Neck: supple Lungs: normal respiratory effort Neurology: alert and oriented Skin: dry, no appreciable rashes Musculoskeletal: no appreciable defects Psychiatric: normal mood and affect Diabetic Foot Exam - Simple   No data filed      Current Medications Patient's Medications  New Prescriptions   CONTINUOUS GLUCOSE SENSOR (DEXCOM G7 SENSOR) MISC    1 Device by Does not apply route continuous.  Previous Medications   ALBUTEROL (VENTOLIN HFA) 108 (90 BASE) MCG/ACT INHALER    Inhale 2 puffs into the lungs every 6 (six) hours as needed for wheezing or shortness of breath.   ASPIRIN 81 MG TABLET    Take 81 mg by mouth daily.   ATORVASTATIN (LIPITOR) 40 MG TABLET    Take 1 tablet (40 mg total) by mouth daily.   BLOOD GLUCOSE MONITORING SUPPL (ONETOUCH VERIO REFLECT) W/DEVICE KIT    USE TO TEST BLOOD SUGARS AS DIRECTED 7 TIMES A DAY.  DX code,E11.649   CYANOCOBALAMIN (,VITAMIN B-12,) 1000 MCG/ML INJECTION    Inject 1,000 mcg into the muscle every 30 (thirty) days.   DICLOFENAC SODIUM (VOLTAREN) 1 % GEL    Apply 2 g  topically 4 (four) times daily as needed.   DOXYCYCLINE (VIBRA-TABS) 100 MG TABLET    Take 1 tablet (100 mg total) by mouth 2 (two) times daily.   FENOFIBRATE 160 MG TABLET    TAKE 1 TABLET BY MOUTH EVERY DAY   FLUTICASONE (FLONASE) 50 MCG/ACT NASAL SPRAY    Place 1 spray into both nostrils daily.   GLUCAGON (GVOKE HYPOPEN 1-PACK) 1 MG/0.2ML SOAJ    Inject 1 mg into the skin as needed (low blood sugar with impaired consciousness).   GLUCOSE BLOOD (ONETOUCH VERIO) TEST STRIP    USE TO TEST BLOOD SUGARS AS DIRECTED 7 TIMES A DAY.  DX code,E11.649   INSULIN PEN NEEDLE (BD PEN NEEDLE NANO 2ND GEN) 32G X 4 MM MISC    USE AS DIRECTED 4 TIMES A DAY   INSULIN SYRINGE-NEEDLE U-100 30G X 1/2" 0.5 ML MISC    1 each by Does not apply route daily. E11.9   LEVOTHYROXINE (SYNTHROID) 137 MCG TABLET    TAKE 1  TABLET BY MOUTH EVERY DAY   LISINOPRIL (ZESTRIL) 20 MG TABLET    TAKE 1 TABLET BY MOUTH EVERY DAY   OMEPRAZOLE (PRILOSEC) 40 MG CAPSULE    Take 1 capsule (40 mg total) by mouth daily.   ONETOUCH DELICA LANCETS 33G MISC    USE TO TEST BLOOD SUGARS AS DIRECTED 7 TIMES A DAY.  DX code,E11.649   OXYCODONE-ACETAMINOPHEN (PERCOCET/ROXICET) 5-325 MG TABLET    Take 1-2 tablets by mouth every 6 (six) hours as needed (for kidney stones).  Modified Medications   Modified Medication Previous Medication   INSULIN DEGLUDEC (TRESIBA FLEXTOUCH) 100 UNIT/ML FLEXTOUCH PEN insulin degludec (TRESIBA FLEXTOUCH) 100 UNIT/ML FlexTouch Pen      INJECT 26 UNITS DAILY    INJECT 26 UNITS DAILY   INSULIN LISPRO (HUMALOG KWIKPEN) 100 UNIT/ML KWIKPEN insulin lispro (HUMALOG KWIKPEN) 100 UNIT/ML KwikPen      Humalog 14 units before breakfast, 15 before lunch and 16 before dinner ac    Humalog 14 units before breakfast, 16 before lunch and 16 before dinner ac  Discontinued Medications   No medications on file    Allergies Allergies  Allergen Reactions   Flomax [Tamsulosin Hcl]     nausea    Past Medical History Past Medical  History:  Diagnosis Date   Allergy    Arthritis    Cholelithiasis    symptomatic   Diabetes mellitus (HCC)    Family history of malignant neoplasm of gastrointestinal tract    Fatty liver    GERD (gastroesophageal reflux disease)    Graves disease    s/p ablation   Hemochromatosis    History of chicken pox    History of hiatal hernia    History of kidney stones    Hyperlipidemia    Hypertension    Internal hemorrhoids    Kidney stones     Personal history of colonic polyps 03/31/2008   tubular adenoma   Right fibular fracture 2013    Past Surgical History Past Surgical History:  Procedure Laterality Date   CHOLECYSTECTOMY N/A 05/29/2017   Procedure: LAPAROSCOPIC CHOLECYSTECTOMY;  Surgeon: Berna Bue, MD;  Location: [facility] OR;  Service: General;  Laterality: N/A;   COLONOSCOPY W/ BIOPSIES AND POLYPECTOMY     LITHOTRIPSY     URETERAL STENT PLACEMENT     left    Family History family history includes Arthritis in his mother; COPD in his mother; Colon cancer (age of onset: 57) in his maternal grandmother; Diabetes in his mother; Hypertension in his mother; Lung cancer in his father.  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 [employer]   Married 1977   1 daughter   Social Drivers of Corporate investment banker Strain: Not on file  Food Insecurity: Not on  file  Transportation Needs: Not on  file  Physical Activity: Not on file  Stress: Not on file  Social Connections: Not on file  Intimate Partner Violence: Not on file    Lab Results  Component Value Date   HGBA1C 7.2 (A) 02/10/2024   HGBA1C 6.6 (H) 09/03/2023   HGBA1C 6.3 04/22/2023   Lab Results  Component Value Date   CHOL 157 01/07/2024   Lab Results  Component Value Date   HDL 46 01/07/2024   Lab Results  Component Value Date   LDLCALC 89 01/07/2024   Lab Results  Component Value Date   TRIG 121 01/07/2024   Lab Results  Component Value Date   CHOLHDL 3.4 01/07/2024   Lab Results  Component Value Date   CREATININE 1.08 09/03/2023   Lab Results  Component Value Date   GFR 70.28 09/03/2023   Lab Results  Component Value Date   MICROALBUR 2.2 (H) 09/03/2023      Component Value Date/Time   NA 139 09/03/2023 0813   NA 139 09/02/2023 0951   K 4.3 09/03/2023 0813   CL 104 09/03/2023 0813   CO2 27 09/03/2023 0813   GLUCOSE 119 (H) 09/03/2023 0813   GLUCOSE 94 12/17/2006 0845   BUN 18 09/03/2023 0813   BUN 22 09/02/2023 0951   CREATININE 1.08 09/03/2023 0813   CREATININE 0.99 09/27/2017 1626   CALCIUM 9.3 09/03/2023 0813   PROT 6.4 09/03/2023 0813   ALBUMIN 4.1 09/03/2023 0813   AST 30 09/03/2023 0813   ALT 25 01/07/2024 0740   ALKPHOS 71 09/03/2023 0813   BILITOT 1.1 09/03/2023 0813   GFRNONAA >60 05/29/2017 0858   GFRAA >60 05/29/2017 0858      Latest Ref Rng & Units 09/03/2023    8:13 AM 09/02/2023    9:51 AM 04/22/2023    8:02 AM  BMP  Glucose 70 - 99 mg/dL 161  096  045   BUN 6 - 23 mg/dL 18  22  20    Creatinine 0.40 - 1.50 mg/dL 4.09  8.11  9.14   BUN/Creat Ratio 10 - 24  21    Sodium 135 - 145 mEq/L 139  139  139   Potassium 3.5 - 5.1 mEq/L 4.3  4.4  4.5   Chloride 96 - 112 mEq/L 104  103  103   CO2 19 - 32 mEq/L 27  21  27    Calcium 8.4 - 10.5 mg/dL 9.3  78.2  9.4        Component Value Date/Time   WBC 5.6 04/29/2023 1157   RBC 4.62 04/29/2023 1157   HGB 15.6  04/29/2023 1157   HCT 45.1 04/29/2023 1157   PLT 202.0 04/29/2023 1157   MCV 97.7 04/29/2023 1157   MCH 31.1 09/27/2017 1626   MCHC 34.7 04/29/2023 1157   RDW 12.9 04/29/2023 1157   LYMPHSABS 1.5 04/29/2023 1157   MONOABS 0.6 04/29/2023 1157   EOSABS 0.3 04/29/2023 1157   BASOSABS 0.0 04/29/2023 1157     Parts of this note may have been dictated using voice recognition software. There may be variances in spelling and vocabulary which are unintentional. Not all errors are proofread. Please notify the Thereasa Parkin if any discrepancies are noted or if the meaning of any statement is not clear.

## 2024-02-20 ENCOUNTER — Ambulatory Visit (INDEPENDENT_AMBULATORY_CARE_PROVIDER_SITE_OTHER): Payer: Medicare Other

## 2024-02-20 DIAGNOSIS — E538 Deficiency of other specified B group vitamins: Secondary | ICD-10-CM | POA: Diagnosis not present

## 2024-02-20 MED ORDER — CYANOCOBALAMIN 1000 MCG/ML IJ SOLN
1000.0000 ug | Freq: Once | INTRAMUSCULAR | Status: AC
Start: 2024-02-20 — End: 2024-02-20
  Administered 2024-02-20: 1000 ug via INTRAMUSCULAR

## 2024-02-20 NOTE — Progress Notes (Signed)
Per orders of Dr. Duncan, injection of monthly B12 1000 mcg/ml given by Fruma [place] P Makesha Belitz, CMA in Right Deltoid. Patient tolerated injection well.  

## 2024-03-24 ENCOUNTER — Ambulatory Visit (INDEPENDENT_AMBULATORY_CARE_PROVIDER_SITE_OTHER): Payer: Medicare Other

## 2024-03-24 DIAGNOSIS — E538 Deficiency of other specified B group vitamins: Secondary | ICD-10-CM

## 2024-03-24 MED ORDER — CYANOCOBALAMIN 1000 MCG/ML IJ SOLN
1000.0000 ug | Freq: Once | INTRAMUSCULAR | Status: AC
Start: 2024-03-24 — End: 2024-03-24
  Administered 2024-03-24: 1000 ug via INTRAMUSCULAR

## 2024-03-24 NOTE — Progress Notes (Signed)
 Per orders of Dr. Deri Fleet, injection of B-12 given by Omeka Holben Y Zavian Slowey in right deltoid. Patient tolerated injection well. Patient will make appointment for 1 month.

## 2024-04-06 ENCOUNTER — Other Ambulatory Visit: Payer: Self-pay

## 2024-04-06 DIAGNOSIS — I10 Essential (primary) hypertension: Secondary | ICD-10-CM

## 2024-04-06 DIAGNOSIS — Z79899 Other long term (current) drug therapy: Secondary | ICD-10-CM

## 2024-04-06 DIAGNOSIS — E782 Mixed hyperlipidemia: Secondary | ICD-10-CM

## 2024-04-06 NOTE — Progress Notes (Signed)
 Order placed for labs that are due in May per NA.

## 2024-04-08 DIAGNOSIS — E782 Mixed hyperlipidemia: Secondary | ICD-10-CM | POA: Diagnosis not present

## 2024-04-08 DIAGNOSIS — I10 Essential (primary) hypertension: Secondary | ICD-10-CM | POA: Diagnosis not present

## 2024-04-08 DIAGNOSIS — Z79899 Other long term (current) drug therapy: Secondary | ICD-10-CM | POA: Diagnosis not present

## 2024-04-08 LAB — LIPID PANEL
Chol/HDL Ratio: 3.5 ratio (ref 0.0–5.0)
Cholesterol, Total: 145 mg/dL (ref 100–199)
HDL: 41 mg/dL (ref >39)
LDL Chol Calc (NIH): 83 mg/dL (ref 0–99)
Triglycerides: 113 mg/dL (ref 0–149)
VLDL Cholesterol Cal: 21 mg/dL (ref 5–40)

## 2024-04-08 LAB — BASIC METABOLIC PANEL WITH GFR
BUN/Creatinine Ratio: 15 (ref 10–24)
BUN: 18 mg/dL (ref 8–27)
CO2: 25 mmol/L (ref 20–29)
Calcium: 9.5 mg/dL (ref 8.6–10.2)
Chloride: 101 mmol/L (ref 96–106)
Creatinine, Ser: 1.23 mg/dL (ref 0.76–1.27)
Glucose: 122 mg/dL — ABNORMAL HIGH (ref 70–99)
Potassium: 4.7 mmol/L (ref 3.5–5.2)
Sodium: 140 mmol/L (ref 134–144)
eGFR: 64 mL/min/{1.73_m2} (ref >59)

## 2024-04-08 LAB — ALT: ALT: 26 IU/L (ref 0–44)

## 2024-04-09 ENCOUNTER — Encounter: Payer: Self-pay | Admitting: Cardiovascular Disease

## 2024-04-14 ENCOUNTER — Ambulatory Visit

## 2024-04-17 ENCOUNTER — Ambulatory Visit (INDEPENDENT_AMBULATORY_CARE_PROVIDER_SITE_OTHER)

## 2024-04-17 VITALS — BP 130/82 | Ht 68.0 in | Wt 173.8 lb

## 2024-04-17 DIAGNOSIS — Z0000 Encounter for general adult medical examination without abnormal findings: Secondary | ICD-10-CM | POA: Diagnosis not present

## 2024-04-17 NOTE — Progress Notes (Signed)
 Please attest and cosign this visit due to patients primary care provider not being in the office at the time the visit was completed.   Subjective:   Ryan Crane is a 68 y.o. who presents for a Medicare Wellness preventive visit.  As a reminder, Annual Wellness Visits don't include a physical exam, and some assessments may be limited, especially if this visit is performed virtually. We may recommend an in-person visit if needed.  Visit Complete: In person  Persons Participating in Visit: Patient.  AWV Questionnaire: No: Patient Medicare AWV questionnaire was not completed prior to this visit.  Cardiac Risk Factors include: advanced age (>77men, >9 women);diabetes mellitus;dyslipidemia;male gender;hypertension     Objective:     Today's Vitals   04/17/24 0958  BP: 130/82  Weight: 173 lb 12.8 oz (78.8 kg)  Height: 5\' 8"  (1.727 m)   Body mass index is 26.43 kg/m.     04/17/2024   10:12 AM 05/29/2017    8:52 AM  Advanced Directives  Does Patient Have a Medical Advance Directive? Yes No  Type of Estate agent of [place];Living will   Copy of Healthcare Power of Attorney in Chart? No - copy requested   Would patient like information on creating a medical advance directive?  No - Patient declined    Current Medications (verified) Outpatient Encounter Medications as of 04/17/2024  Medication Sig   albuterol  (VENTOLIN  HFA) 108 (90 Base) MCG/ACT inhaler Inhale 2 puffs into the lungs every 6 (six) hours as needed for wheezing or shortness of breath.   aspirin 81 MG tablet Take 81 mg by mouth daily.   Blood Glucose Monitoring Suppl (ONETOUCH VERIO REFLECT) w/Device KIT USE TO TEST BLOOD SUGARS AS DIRECTED 7 TIMES A DAY.  DX code,E11.649   Continuous Glucose Sensor (DEXCOM G7 SENSOR) MISC 1 Device by Does not apply route continuous.   cyanocobalamin  (,VITAMIN B-12,) 1000 MCG/ML injection Inject 1,000 mcg into the muscle every 30 (thirty) days.   diclofenac   sodium (VOLTAREN ) 1 % GEL Apply 2 g topically 4 (four) times daily as needed.   doxycycline  (VIBRA -TABS) 100 MG tablet Take 1 tablet (100 mg total) by mouth 2 (two) times daily.   fenofibrate  160 MG tablet TAKE 1 TABLET BY MOUTH EVERY DAY   fluticasone  (FLONASE ) 50 MCG/ACT nasal spray Place 1 spray into both nostrils daily.   Glucagon  (GVOKE HYPOPEN  1-PACK) 1 MG/0.2ML SOAJ Inject 1 mg into the skin as needed (low blood sugar with impaired consciousness).   glucose blood (ONETOUCH VERIO) test strip USE TO TEST BLOOD SUGARS AS DIRECTED 7 TIMES A DAY.  DX code,E11.649   insulin  degludec (TRESIBA  FLEXTOUCH) 100 UNIT/ML FlexTouch Pen INJECT 26 UNITS DAILY   insulin  lispro (HUMALOG  KWIKPEN) 100 UNIT/ML KwikPen Humalog  14 units before breakfast, 15 before lunch and 16 before dinner ac   Insulin  Pen Needle (BD PEN NEEDLE NANO 2ND GEN) 32G X 4 MM MISC USE AS DIRECTED 4 TIMES A DAY   Insulin  Syringe-Needle U-100 30G X 1/2" 0.5 ML MISC 1 each by Does not apply route daily. E11.9   levothyroxine  (SYNTHROID ) 137 MCG tablet TAKE 1 TABLET BY MOUTH EVERY DAY   lisinopril  (ZESTRIL ) 20 MG tablet TAKE 1 TABLET BY MOUTH EVERY DAY   omeprazole  (PRILOSEC) 40 MG capsule Take 1 capsule (40 mg total) by mouth daily.   OneTouch Delica Lancets 33G MISC USE TO TEST BLOOD SUGARS AS DIRECTED 7 TIMES A DAY.  DX code,E11.649   atorvastatin  (LIPITOR) 40  MG tablet Take 1 tablet (40 mg total) by mouth daily.   oxyCODONE -acetaminophen  (PERCOCET/ROXICET) 5-325 MG tablet Take 1-2 tablets by mouth every 6 (six) hours as needed (for kidney stones). (Patient not taking: Reported on 04/17/2024)   No facility-administered encounter medications on file as of 04/17/2024.    Allergies (verified) Flomax  [tamsulosin  hcl]   History: Past Medical History:  Diagnosis Date   Allergy    Arthritis    Cholelithiasis    symptomatic   Diabetes mellitus (HCC)    Family history of malignant neoplasm of gastrointestinal tract    Fatty liver     GERD (gastroesophageal reflux disease)    Graves disease    s/p ablation   Hemochromatosis    History of chicken pox    History of hiatal hernia    History of kidney stones    Hyperlipidemia    Hypertension    Internal hemorrhoids    Kidney stones     Personal history of colonic polyps 03/31/2008   tubular adenoma   Right fibular fracture 2013   Past Surgical History:  Procedure Laterality Date   CHOLECYSTECTOMY N/A 05/29/2017   Procedure: LAPAROSCOPIC CHOLECYSTECTOMY;  Surgeon: Adalberto Acton, MD;  Location: [facility] OR;  Service: General;  Laterality: N/A;   COLONOSCOPY W/ BIOPSIES AND POLYPECTOMY     LITHOTRIPSY     URETERAL STENT PLACEMENT     left   Family History  Problem Relation Age of Onset   COPD Mother    Arthritis Mother    Diabetes Mother    Hypertension Mother    Lung cancer Father    Colon cancer Maternal Grandmother 22   Prostate cancer Neg Hx    Esophageal cancer Neg Hx    Rectal cancer Neg Hx    Stomach cancer Neg Hx    Thyroid  disease Neg Hx    Colon polyps Neg Hx    Social History   Socioeconomic History   Marital status: Married    Spouse name: Not on file   Number of children: 1   Years of education: Not on file   Highest education level: Not on file  Occupational History   Occupation: PROJECT MANAGER    Employer: [employer]  Tobacco Use   Smoking status: Never   Smokeless tobacco: Never  Vaping Use   Vaping status: Never Used  Substance and Sexual Activity   Alcohol use: No    Alcohol/week: 0.0 standard drinks of alcohol   Drug use: No   Sexual activity: Yes  Other Topics Concern   Not on file  Social History Narrative   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 [employer]   Married 1977   1 daughter   Social Drivers of Corporate investment banker Strain: Low Risk  (04/17/2024)   Overall Financial Resource Strain (CARDIA)    Difficulty of Paying Living Expenses: Not hard at all  Food Insecurity: No Food Insecurity (04/17/2024)   Hunger Vital  Sign    Worried About Running Out of Food in the Last Year: Never true    Ran Out of Food in the Last Year: Never true  Transportation Needs: No Transportation Needs (04/17/2024)   PRAPARE - Administrator, Civil Service (Medical): No    Lack of Transportation (Non-Medical): No  Physical Activity: Insufficiently Active (04/17/2024)   Exercise Vital Sign    Days of Exercise per Week: 3 days    Minutes of Exercise per Session: 30  min  Stress: No Stress Concern Present (04/17/2024)   [employer] of Occupational Health - Occupational Stress Questionnaire    Feeling of Stress : Not at all  Social Connections: Socially Integrated (04/17/2024)   Social Connection and Isolation Panel [NHANES]    Frequency of Communication with Friends and Family: More than three times a week    Frequency of Social Gatherings with Friends and Family: More than three times a week    Attends Religious Services: More than 4 times per year    Active Member of [employer] or Organizations: Yes    Attends Engineer, structural: More than 4 times per year    Marital Status: Married    Tobacco Counseling Counseling given: Not Answered    Clinical Intake:  Pre-visit preparation completed: Yes  Pain : No/denies pain     BMI - recorded: 26.43 Nutritional Status: BMI 25 -29 Overweight Nutritional Risks: None Diabetes: Yes CBG done?: Yes (BS 213 this am (usually below 100)) CBG resulted in Enter/ Edit results?: No Did pt. bring in CBG monitor from home?: No  Lab Results  Component Value Date   HGBA1C 7.2 (A) 02/10/2024   HGBA1C 6.6 (H) 09/03/2023   HGBA1C 6.3 04/22/2023     How often do you need to have someone help you when you read instructions, pamphlets, or other written materials from your doctor or pharmacy?: 1 - Never  Interpreter Needed?: No  Comments: lives with wife Information entered by :: B.Miliyah Luper,LPN   Activities of Daily Living     04/17/2024   10:13 AM  In your  present state of health, do you have any difficulty performing the following activities:  Hearing? 0  Vision? 0  Difficulty concentrating or making decisions? 0  Walking or climbing stairs? 0  Dressing or bathing? 0  Doing errands, shopping? 0  Preparing Food and eating ? N  Using the Toilet? N  Do you have problems with loss of bowel control? N  Managing your Medications? N  Managing your Finances? N  Housekeeping or managing your Housekeeping? N    Patient Care Team: Donnie Galea, MD as PCP - General (Family Medicine) Nahser, Lela Purple, MD as PCP - Cardiology (Cardiology) Phebe Brasil, OD (Optometry)  Indicate any recent Medical Services you may have received from other than [facility] providers in the past year (date may be approximate).     Assessment:    This is a routine wellness examination for Jaskirat.  Hearing/Vision screen Hearing Screening - Comments:: Lives with hearing is good Vision Screening - Comments:: Pt says has good vision with glasses Dr Annabell Key   Goals Addressed             This Visit's Progress    Patient Stated       I would like to get better with diabetes       Depression Screen     04/17/2024   10:09 AM 05/09/2023   10:40 AM 04/29/2023   11:56 AM 04/24/2022    2:10 PM 04/24/2021    8:34 AM 03/24/2018   10:59 AM 07/09/2017    8:35 AM  PHQ 2/9 Scores  PHQ - 2 Score 0 0 0 0 0 0 0  PHQ- 9 Score  0 0  0      Fall Risk     04/17/2024   10:06 AM 05/09/2023   10:40 AM 04/29/2023   11:56 AM 04/29/2023   11:05 AM 04/24/2022    2:09  PM  Fall Risk   Falls in the past year? 0 0 0 0 0  Number falls in past yr: 0 0 0 0 0  Injury with Fall? 0 0 0 0 0  Risk for fall due to : No Fall Risks No Fall Risks No Fall Risks No Fall Risks No Fall Risks  Follow up Education provided;Falls prevention discussed Falls evaluation completed Falls evaluation completed Falls evaluation completed Falls evaluation completed    MEDICARE RISK AT HOME:  Medicare Risk  at Home Any stairs in or around the home?: Yes If so, are there any without handrails?: Yes Home free of loose throw rugs in walkways, pet beds, electrical cords, etc?: Yes Adequate lighting in your home to reduce risk of falls?: Yes Life alert?: No Use of a cane, walker or w/c?: No Grab bars in the bathroom?: No Shower chair or bench in shower?: No Elevated toilet seat or a handicapped toilet?: No  TIMED UP AND GO:  Was the test performed?  Yes  Length of time to ambulate 10 feet: 12 sec Gait steady and fast without use of assistive device  Cognitive Function: 6CIT completed        04/17/2024   10:18 AM  6CIT Screen  What Year? 0 points  What month? 0 points  What time? 0 points  Count back from 20 0 points  Months in reverse 0 points  Repeat phrase 4 points  Total Score 4 points    Immunizations Immunization History  Administered Date(s) Administered   Influenza Whole 12/10/2005, 09/21/2008   Influenza, High Dose Seasonal PF 09/18/2019, 09/09/2021, 08/31/2022, 09/16/2023   Influenza,inj,Quad PF,6+ Mos 09/20/2018   Influenza-Unspecified 10/18/2014, 09/20/2015, 09/21/2016, 09/13/2017, 09/17/2020   Moderna Sars-Covid-2 Vaccination 12/07/2020   PFIZER(Purple Top)SARS-COV-2 Vaccination 04/19/2020, 05/10/2020   PNEUMOCOCCAL CONJUGATE-20 04/24/2021   Pfizer(Comirnaty)Fall Seasonal Vaccine 12 years and older 10/16/2023   Pneumococcal Polysaccharide-23 03/26/2016   Td 12/31/2007   Tdap 03/24/2018   Zoster Recombinant(Shingrix ) 11/26/2019, 08/08/2021, 11/06/2021   Zoster, Live 03/02/2014    Screening Tests Health Maintenance  Topic Date Due   FOOT EXAM  01/15/2024   INFLUENZA VACCINE  07/10/2024   OPHTHALMOLOGY EXAM  08/04/2024   HEMOGLOBIN A1C  08/12/2024   Diabetic kidney evaluation - Urine ACR  09/02/2024   Diabetic kidney evaluation - eGFR measurement  04/08/2025   Medicare Annual Wellness (AWV)  04/17/2025   Colonoscopy  07/28/2026   DTaP/Tdap/Td (3 - Td or  Tdap) 03/24/2028   Pneumonia Vaccine 62+ Years old  Completed   Hepatitis C Screening  Completed   Zoster Vaccines- Shingrix   Completed   HPV VACCINES  Aged Out   Meningococcal B Vaccine  Aged Out   COVID-19 Vaccine  Discontinued    Health Maintenance  Health Maintenance Due  Topic Date Due   FOOT EXAM  01/15/2024   Health Maintenance Items Addressed: None at this time  Additional Screening:  Vision Screening: Recommended annual ophthalmology exams for early detection of glaucoma and other disorders of the eye.  Dental Screening: Recommended annual dental exams for proper oral hygiene  Community Resource Referral / Chronic Care Management: CRR required this visit?  No   CCM required this visit?  No   Plan:    I have personally reviewed and noted the following in the patient's chart:   Medical and social history Use of alcohol, tobacco or illicit drugs  Current medications and supplements including opioid prescriptions. Patient is not currently taking opioid prescriptions. Functional ability and  status Nutritional status Physical activity Advanced directives List of other physicians Hospitalizations, surgeries, and ER visits in previous 12 months Vitals Screenings to include cognitive, depression, and falls Referrals and appointments  In addition, I have reviewed and discussed with patient certain preventive protocols, quality metrics, and best practice recommendations. A written personalized care plan for preventive services as well as general preventive health recommendations were provided to patient.   Nerissa Bannister, LPN   12/15/1094   After Visit Summary: (MyChart) Due to this being a telephonic visit, the after visit summary with patients personalized plan was offered to patient via MyChart   Notes: Nothing significant to report at this time.

## 2024-04-17 NOTE — Patient Instructions (Signed)
 Mr. Ryan Crane , Thank you for taking time out of your busy schedule to complete your Annual Wellness Visit with me. I enjoyed our conversation and look forward to speaking with you again next year. I, as well as your care team,  appreciate your ongoing commitment to your health goals. Please review the following plan we discussed and let me know if I can assist you in the future. Your Game plan/ To Do List     Follow up Visits: Next Medicare AWV with our clinical staff: 04/20/2025 @ 10:50am in person   Have you seen your provider in the last 6 months (3 months if uncontrolled diabetes)? No Pt has/sees enocrinologist Next Office Visit with your provider: 04/27/2024  Clinician Recommendations:  Aim for 30 minutes of exercise or brisk walking, 6-8 glasses of water, and 5 servings of fruits and vegetables each day.       This is a list of the screening recommended for you and due dates:  Health Maintenance  Topic Date Due   Complete foot exam   01/15/2024   Flu Shot  07/10/2024   Eye exam for diabetics  08/04/2024   Hemoglobin A1C  08/12/2024   Yearly kidney health urinalysis for diabetes  09/02/2024   Yearly kidney function blood test for diabetes  04/08/2025   Medicare Annual Wellness Visit  04/17/2025   Colon Cancer Screening  07/28/2026   DTaP/Tdap/Td vaccine (3 - Td or Tdap) 03/24/2028   Pneumonia Vaccine  Completed   Hepatitis C Screening  Completed   Zoster (Shingles) Vaccine  Completed   HPV Vaccine  Aged Out   Meningitis B Vaccine  Aged Out   COVID-19 Vaccine  Discontinued    Advanced directives: (Copy Requested) Please bring a copy of your health care power of attorney and living will to the office to be added to your chart at your convenience. You can mail to [facility] [address] [place], [place] [address] or email to [email or web address] Advance Care Planning is important because it:  [x]  Makes sure you receive the medical care that is consistent  with your values, goals, and preferences [x]  It provides guidance to your family and loved ones and reduces their decisional burden about whether or not they are making the right decisions based on your wishes.  Follow the link provided in your after visit summary or read over the paperwork we have mailed to you to help you started getting your Advance Directives in place. If you need assistance in completing these, please reach out to us  so that we can help you!

## 2024-04-19 ENCOUNTER — Other Ambulatory Visit: Payer: Self-pay | Admitting: Family Medicine

## 2024-04-19 DIAGNOSIS — Z125 Encounter for screening for malignant neoplasm of prostate: Secondary | ICD-10-CM

## 2024-04-19 DIAGNOSIS — E538 Deficiency of other specified B group vitamins: Secondary | ICD-10-CM

## 2024-04-19 DIAGNOSIS — E0500 Thyrotoxicosis with diffuse goiter without thyrotoxic crisis or storm: Secondary | ICD-10-CM

## 2024-04-20 ENCOUNTER — Other Ambulatory Visit (INDEPENDENT_AMBULATORY_CARE_PROVIDER_SITE_OTHER): Payer: Medicare Other

## 2024-04-20 ENCOUNTER — Encounter (HOSPITAL_COMMUNITY): Payer: Self-pay

## 2024-04-20 DIAGNOSIS — E0500 Thyrotoxicosis with diffuse goiter without thyrotoxic crisis or storm: Secondary | ICD-10-CM

## 2024-04-20 DIAGNOSIS — E538 Deficiency of other specified B group vitamins: Secondary | ICD-10-CM

## 2024-04-20 DIAGNOSIS — Z125 Encounter for screening for malignant neoplasm of prostate: Secondary | ICD-10-CM | POA: Diagnosis not present

## 2024-04-20 LAB — CBC WITH DIFFERENTIAL/PLATELET
Basophils Absolute: 0.1 10*3/uL (ref 0.0–0.1)
Basophils Relative: 1 % (ref 0.0–3.0)
Eosinophils Absolute: 0.4 10*3/uL (ref 0.0–0.7)
Eosinophils Relative: 7.9 % — ABNORMAL HIGH (ref 0.0–5.0)
HCT: 44.8 % (ref 39.0–52.0)
Hemoglobin: 15.3 g/dL (ref 13.0–17.0)
Lymphocytes Relative: 26.5 % (ref 12.0–46.0)
Lymphs Abs: 1.4 10*3/uL (ref 0.7–4.0)
MCHC: 34.2 g/dL (ref 30.0–36.0)
MCV: 99.8 fl (ref 78.0–100.0)
Monocytes Absolute: 0.5 10*3/uL (ref 0.1–1.0)
Monocytes Relative: 10.2 % (ref 3.0–12.0)
Neutro Abs: 2.8 10*3/uL (ref 1.4–7.7)
Neutrophils Relative %: 54.4 % (ref 43.0–77.0)
Platelets: 169 10*3/uL (ref 150.0–400.0)
RBC: 4.49 Mil/uL (ref 4.22–5.81)
RDW: 13.5 % (ref 11.5–15.5)
WBC: 5.1 10*3/uL (ref 4.0–10.5)

## 2024-04-20 LAB — TSH: TSH: 0.57 u[IU]/mL (ref 0.35–5.50)

## 2024-04-20 LAB — PSA, MEDICARE: PSA: 1.68 ng/mL (ref 0.10–4.00)

## 2024-04-20 LAB — VITAMIN B12: Vitamin B-12: 490 pg/mL (ref 211–911)

## 2024-04-22 ENCOUNTER — Ambulatory Visit: Payer: Self-pay | Admitting: Family Medicine

## 2024-04-27 ENCOUNTER — Encounter: Payer: Medicare Other | Admitting: Family Medicine

## 2024-04-28 ENCOUNTER — Encounter: Payer: Self-pay | Admitting: Family Medicine

## 2024-05-01 ENCOUNTER — Ambulatory Visit (INDEPENDENT_AMBULATORY_CARE_PROVIDER_SITE_OTHER): Admitting: Family Medicine

## 2024-05-01 DIAGNOSIS — E039 Hypothyroidism, unspecified: Secondary | ICD-10-CM

## 2024-05-01 DIAGNOSIS — Z0000 Encounter for general adult medical examination without abnormal findings: Secondary | ICD-10-CM

## 2024-05-01 DIAGNOSIS — N200 Calculus of kidney: Secondary | ICD-10-CM | POA: Diagnosis not present

## 2024-05-01 DIAGNOSIS — Z125 Encounter for screening for malignant neoplasm of prostate: Secondary | ICD-10-CM

## 2024-05-01 DIAGNOSIS — E538 Deficiency of other specified B group vitamins: Secondary | ICD-10-CM | POA: Diagnosis not present

## 2024-05-01 DIAGNOSIS — Z7189 Other specified counseling: Secondary | ICD-10-CM

## 2024-05-01 DIAGNOSIS — I10 Essential (primary) hypertension: Secondary | ICD-10-CM | POA: Diagnosis not present

## 2024-05-01 DIAGNOSIS — E10649 Type 1 diabetes mellitus with hypoglycemia without coma: Secondary | ICD-10-CM

## 2024-05-01 LAB — FERRITIN: Ferritin: 152.2 ng/mL (ref 22.0–322.0)

## 2024-05-01 MED ORDER — LEVOTHYROXINE SODIUM 137 MCG PO TABS: 137.0000 ug | ORAL_TABLET | Freq: Every day | ORAL | 3 refills | Status: AC

## 2024-05-01 MED ORDER — CYANOCOBALAMIN 1000 MCG/ML IJ SOLN
1000.0000 ug | Freq: Once | INTRAMUSCULAR | Status: AC
  Administered 2024-05-01: 1000 ug via INTRAMUSCULAR

## 2024-05-01 MED ORDER — FENOFIBRATE 160 MG PO TABS: 160.0000 mg | ORAL_TABLET | Freq: Every day | ORAL | 3 refills | Status: AC

## 2024-05-01 MED ORDER — OMEPRAZOLE 40 MG PO CPDR: 40.0000 mg | DELAYED_RELEASE_CAPSULE | Freq: Every day | ORAL | 3 refills | Status: AC

## 2024-05-01 MED ORDER — OXYCODONE-ACETAMINOPHEN 5-325 MG PO TABS: 1.0000 | ORAL_TABLET | Freq: Four times a day (QID) | ORAL | 0 refills | Status: AC | PRN

## 2024-05-01 MED ORDER — LISINOPRIL 20 MG PO TABS: 20.0000 mg | ORAL_TABLET | Freq: Every day | ORAL | 3 refills | Status: AC

## 2024-05-01 NOTE — Patient Instructions (Addendum)
 Go to the lab on the way out.   If you have mychart we'll likely use that to update you.    Take care.  Glad to see you. Update me as needed.   Recheck in about 1 year, labs ahead of time if possible.

## 2024-05-01 NOTE — Progress Notes (Unsigned)
 He has been playing golf with his grandson and he really enjoys that.  Discussed.  Flu 2024 Shingles previously done PNA previously done Tetanus 2019 RSV d/w pt.   COVID-vaccine previously done Colon cancer screening-2024. Prostate cancer screening 2025. Advance directive-wife designated if patient were incapacitated.  B12 def.  On replacement monthly.  Compliant.  Would continue replacement as is. B12 dose done at OV.    Elevated Cholesterol: Using medications without problems: yes Muscle aches: no Diet compliance: yes Exercise: yes   Hypothyroidism.  On replacement at baseline. Compliant.   TSH normal 04/2024.  No neck mass.    Hypertension:               Using medication without problems or lightheadedness: yes Chest pain with exertion: no Edema:no Short of breath: no  DM per endo.  I'll defer.  Compliant with meds and diet.  Had eye exam- [facility].    Hemochromatosis.  Ferritin pending.  Compliant with diet.    Swallowing well, d/w pt about HH that was previously noted.     H/o renal stones with some prev stone passage but not recent sx.  No recent blood in urine.  Has rx for prn oxycodone .  No recent use.  Rx sent.    Meds, vitals, and allergies reviewed.   ROS: Per HPI unless specifically indicated in ROS section   GEN: nad, alert and oriented HEENT: mucous membranes moist NECK: supple w/o LA CV: rrr. PULM: ctab, no inc wob ABD: soft, +bs EXT: no edema SKIN: no acute rash  Diabetic foot exam: Normal inspection No skin breakdown No calluses  Normal DP pulses Normal sensation to light touch and monofilament Nails normal

## 2024-05-04 ENCOUNTER — Ambulatory Visit: Payer: Self-pay | Admitting: Family Medicine

## 2024-05-04 NOTE — Assessment & Plan Note (Signed)
 On replacement at baseline. Compliant.   TSH normal 04/2024.  No neck mass.   Continue levothyroxine  replacement as is.

## 2024-05-04 NOTE — Assessment & Plan Note (Signed)
 On replacement monthly.  Compliant.  Would continue replacement as is. B12 dose done at OV.

## 2024-05-04 NOTE — Assessment & Plan Note (Signed)
 I did not order his ferritin with his previous labs.  He was gracious about that.  Drawn today.  See notes on labs.  Continue work on diet.

## 2024-05-04 NOTE — Assessment & Plan Note (Signed)
Continue lisinopril.  Previous labs discussed with patient.

## 2024-05-04 NOTE — Assessment & Plan Note (Signed)
 DM per endo.  I'll defer.  Compliant with meds and diet.

## 2024-05-04 NOTE — Assessment & Plan Note (Signed)
 Advance directive- wife designated if patient were incapacitated.

## 2024-05-04 NOTE — Assessment & Plan Note (Signed)
 Flu 2024 Shingles previously done PNA previously done Tetanus 2019 RSV d/w pt.   COVID-vaccine previously done Colon cancer screening-2024. Prostate cancer screening 2025. Advance directive-wife designated if patient were incapacitated.

## 2024-05-04 NOTE — Assessment & Plan Note (Signed)
 H/o renal stones with some prev stone passage but not recent sx.  No recent blood in urine.  Has rx for prn oxycodone .  No recent use.  Rx sent.

## 2024-05-21 ENCOUNTER — Other Ambulatory Visit: Payer: Self-pay | Admitting: "Endocrinology

## 2024-05-21 NOTE — Telephone Encounter (Signed)
 Requested Prescriptions   Pending Prescriptions Disp Refills   Continuous Glucose Sensor (DEXCOM G7 SENSOR) MISC [Pharmacy Med Name: DEXCOM G7 SENSOR] 9 each 0    Sig: USE AS DIRECTED

## 2024-05-26 ENCOUNTER — Encounter: Payer: Self-pay | Admitting: Family Medicine

## 2024-06-02 ENCOUNTER — Ambulatory Visit (INDEPENDENT_AMBULATORY_CARE_PROVIDER_SITE_OTHER): Payer: Medicare Other

## 2024-06-02 DIAGNOSIS — E538 Deficiency of other specified B group vitamins: Secondary | ICD-10-CM | POA: Diagnosis not present

## 2024-06-02 MED ORDER — CYANOCOBALAMIN 1000 MCG/ML IJ SOLN
1000.0000 ug | Freq: Once | INTRAMUSCULAR | Status: AC
  Administered 2024-06-02: 1000 ug via INTRAMUSCULAR

## 2024-06-02 NOTE — Progress Notes (Signed)
 Per orders of Dr. Crawford Givens, injection of vitamin b 12 given by Lewanda Rife in right deltoid. Patient tolerated injection well. Patient will make appointment for 1 month.

## 2024-06-09 ENCOUNTER — Encounter: Payer: Self-pay | Admitting: "Endocrinology

## 2024-06-09 ENCOUNTER — Ambulatory Visit: Admitting: "Endocrinology

## 2024-06-09 DIAGNOSIS — Z794 Long term (current) use of insulin: Secondary | ICD-10-CM

## 2024-06-09 DIAGNOSIS — E782 Mixed hyperlipidemia: Secondary | ICD-10-CM

## 2024-06-09 LAB — POCT GLYCOSYLATED HEMOGLOBIN (HGB A1C): Hemoglobin A1C: 7 % — AB (ref 4.0–5.6)

## 2024-06-09 MED ORDER — TRESIBA FLEXTOUCH 100 UNIT/ML ~~LOC~~ SOPN: PEN_INJECTOR | SUBCUTANEOUS | 5 refills | Status: AC

## 2024-06-09 NOTE — Progress Notes (Signed)
 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 -     POCT glycosylated hemoglobin (Hb A1C) -     Microalbumin / creatinine urine ratio -     insulin  degludec (TRESIBA  FLEXTOUCH) 100 UNIT/ML FlexTouch Pen; INJECT 24 UNITS DAILY  Long-term insulin  use (HCC)  Mixed hypercholesterolemia and hypertriglyceridemia   Hemochromatosis induced diabetes  Hba1c goal less than 7.0, current Hba1c is 7.2 Will recommend for the following change of medications to: Humalog  14 units before breakfast, 16 before lunch and 16 before dinner ac 15 min before meals (take 4 units less if eating salad/fish) Tresiba  24 units daily qhs Has CGM-sent Dexcom G7 for a try   No known contraindications to any of above medications Glucagon  prescribed 05/10/23  Hyperlipidemia -Last LDL off goal: 83, Tg 113 -on atorvastatin  40 mg every day, fenofibrate  160 mg qd -Follow low fat diet and exercise   -Blood pressure goal <140/90 - Microalbumin/creatinine at goal < 30 - on ACE/ARB lisinopril  40 mg qd -diet changes including salt restriction -limit eating outside -counseled BP targets per standards of diabetes care -Uncontrolled blood pressure can lead to retinopathy, nephropathy and cardiovascular and atherosclerotic heart disease  Reviewed and counseled on: -A1C target -Blood sugar targets -Complications of uncontrolled diabetes  -Checking blood sugar before meals and bedtime and bring log next visit -All medications with mechanism of action and side effects -Hypoglycemia management: rule of 15's, Glucagon  Emergency Kit and medical alert ID -low-carb low-fat plate-method diet -At least 20 minutes of physical activity per  day -Annual dilated retinal eye exam and foot exam -compliance and follow up needs -follow up as scheduled or earlier if problem gets worse  Call if blood sugar is less than 70 or consistently above 250    Take a 15 gm snack of carbohydrate at bedtime before you go to sleep if your blood sugar is less than 100.    If you are going to fast after midnight for a test or procedure, ask your physician for instructions on how to reduce/decrease your insulin  dose.    Call if blood sugar is less than 70 or consistently above 250  -Treating a low sugar by rule of 15  (15 gms of sugar every 15 min until sugar is more than 70) If you feel your sugar is low, test your sugar to be sure If your sugar is low (less than 70), then take 15 grams of a fast acting Carbohydrate (3-4 glucose tablets or glucose gel or 4 ounces of juice or regular soda) Recheck your sugar 15 min after treating low to make sure it is more than 70 If sugar is still less than 70, treat again with 15 grams of carbohydrate          Don't drive the hour of hypoglycemia  If unconscious/unable to eat or drink by mouth, use glucagon  injection or nasal spray baqsimi and call 911. Can repeat again in 15 min if still unconscious.  No follow-ups on file.   I have reviewed current medications, nurse's notes, allergies, vital signs, past medical and surgical history, family medical history, and social history for this encounter. Counseled patient on symptoms, examination findings, lab findings, imaging results, treatment decisions and monitoring  and prognosis. The patient understood the recommendations and agrees with the treatment plan. All questions regarding treatment plan were fully answered.  Obadiah Birmingham, MD  06/09/24    History of Present Illness CLERENCE GUBSER is a 68 y.o. year old male who presents for follow up of Hemochromatosis induced diabetes.  Guadalupe Nickless Myren was first diagnosed in 2013.   Diabetes education +  Home  diabetes regimen: Humalog  15 units before breakfast, 17 before lunch and 17 before dinner ac     Tresiba  26 units daily  Previous history: Non-insulin  hypoglycemic drugs previously used: Metformin , glipizide , Januvia  Insulin  was started in 2019  COMPLICATIONS -  MI/Stroke -  retinopathy -  neuropathy -  nephropathy  BLOOD SUGAR DATA Per meter, 70-220  Physical Exam  BP 104/70   Pulse 60   Ht 5' 8 (1.727 m)   Wt 174 lb (78.9 kg)   SpO2 98%   BMI 26.46 kg/m    Constitutional: well developed, well nourished Head: normocephalic, atraumatic Eyes: sclera anicteric, no redness Neck: supple Lungs: normal respiratory effort Neurology: alert and oriented Skin: dry, no appreciable rashes Musculoskeletal: no appreciable defects Psychiatric: normal mood and affect Diabetic Foot Exam - Simple   No data filed      Current Medications Patient's Medications  New Prescriptions   No medications on file  Previous Medications   ALBUTEROL  (VENTOLIN  HFA) 108 (90 BASE) MCG/ACT INHALER    Inhale 2 puffs into the lungs every 6 (six) hours as needed for wheezing or shortness of breath.   ASPIRIN 81 MG TABLET    Take 81 mg by mouth daily.   ATORVASTATIN  (LIPITOR) 40 MG TABLET    Take 1 tablet (40 mg total) by mouth daily.   BLOOD GLUCOSE MONITORING SUPPL (ONETOUCH VERIO REFLECT) W/DEVICE KIT    USE TO TEST BLOOD SUGARS AS DIRECTED 7 TIMES A DAY.  DX code,E11.649   CONTINUOUS GLUCOSE SENSOR (DEXCOM G7 SENSOR) MISC    USE AS DIRECTED   CYANOCOBALAMIN  (,VITAMIN B-12,) 1000 MCG/ML INJECTION    Inject 1,000 mcg into the muscle every 30 (thirty) days.   DICLOFENAC  SODIUM (VOLTAREN ) 1 % GEL    Apply 2 g topically 4 (four) times daily as needed.   FENOFIBRATE  160 MG TABLET    Take 1 tablet (160 mg total) by mouth daily.   FLUTICASONE  (FLONASE ) 50 MCG/ACT NASAL SPRAY    Place 1 spray into both nostrils daily.   GLUCAGON  (GVOKE HYPOPEN  1-PACK) 1 MG/0.2ML SOAJ    Inject 1 mg into the skin as needed  (low blood sugar with impaired consciousness).   GLUCOSE BLOOD (ONETOUCH VERIO) TEST STRIP    USE TO TEST BLOOD SUGARS AS DIRECTED 7 TIMES A DAY.  DX code,E11.649   INSULIN  LISPRO (HUMALOG  KWIKPEN) 100 UNIT/ML KWIKPEN    Humalog  14 units before breakfast, 15 before lunch and 16 before dinner ac   INSULIN  PEN NEEDLE (BD PEN NEEDLE NANO 2ND GEN) 32G X 4 MM MISC    USE AS DIRECTED 4 TIMES A DAY   INSULIN  SYRINGE-NEEDLE U-100 30G X 1/2 0.5 ML MISC    1 each by Does not apply route daily. E11.9   LEVOTHYROXINE  (SYNTHROID ) 137 MCG TABLET    Take 1 tablet (137 mcg total) by mouth daily.   LISINOPRIL  (ZESTRIL ) 20 MG TABLET    Take 1 tablet (20 mg total) by mouth daily.   OMEPRAZOLE  (PRILOSEC) 40 MG CAPSULE    Take 1 capsule (40 mg total)  by mouth daily.   ONETOUCH DELICA LANCETS 33G MISC    USE TO TEST BLOOD SUGARS AS DIRECTED 7 TIMES A DAY.  DX code,E11.649   OXYCODONE -ACETAMINOPHEN  (PERCOCET/ROXICET) 5-325 MG TABLET    Take 1-2 tablets by mouth every 6 (six) hours as needed (for kidney stones).  Modified Medications   Modified Medication Previous Medication   INSULIN  DEGLUDEC (TRESIBA  FLEXTOUCH) 100 UNIT/ML FLEXTOUCH PEN insulin  degludec (TRESIBA  FLEXTOUCH) 100 UNIT/ML FlexTouch Pen      INJECT 24 UNITS DAILY    INJECT 26 UNITS DAILY  Discontinued Medications   No medications on file    Allergies Allergies  Allergen Reactions   Flomax  [Tamsulosin  Hcl]     nausea    Past Medical History Past Medical History:  Diagnosis Date   Allergy    Arthritis    Cholelithiasis    symptomatic   Diabetes mellitus (HCC)    Family history of malignant neoplasm of gastrointestinal tract    Fatty liver    GERD (gastroesophageal reflux disease)    Graves disease    s/p ablation   Hemochromatosis    History of chicken pox    History of hiatal hernia    History of kidney stones    Hyperlipidemia    Hypertension    Internal hemorrhoids    Kidney stones     Personal history of colonic polyps  03/31/2008   tubular adenoma   Right fibular fracture 2013    Past Surgical History Past Surgical History:  Procedure Laterality Date   CHOLECYSTECTOMY N/A 05/29/2017   Procedure: LAPAROSCOPIC CHOLECYSTECTOMY;  Surgeon: Signe Mitzie LABOR, MD;  Location: [facility] OR;  Service: General;  Laterality: N/A;   COLONOSCOPY W/ BIOPSIES AND POLYPECTOMY     LITHOTRIPSY     URETERAL STENT PLACEMENT     left    Family History family history includes Arthritis in his mother; COPD in his mother; Colon cancer (age of onset: 43) in his maternal grandmother; Diabetes in his mother; Hypertension in his mother; Lung cancer in his father.  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 [employer]   Married 1977   1 daughter   Social Drivers of Corporate investment banker Strain: Low Risk  (04/17/2024)   Overall Financial Resource Strain (CARDIA)    Difficulty of Paying Living Expenses: Not hard at all  Food Insecurity: No Food Insecurity (04/17/2024)   Hunger Vital Sign    Worried About Running Out of Food in the Last Year: Never true    Ran Out of Food in the Last Year: Never true  Transportation Needs: No Transportation Needs (04/17/2024)   PRAPARE - Administrator, Civil Service (Medical): No    Lack of Transportation (Non-Medical): No  Physical Activity: Insufficiently Active (  04/17/2024)   Exercise Vital Sign    Days of  Exercise per Week: 3 days    Minutes of Exercise per Session: 30 min  Stress: No Stress Concern Present (04/17/2024)   [employer] of Occupational Health - Occupational Stress Questionnaire    Feeling of Stress : Not at all  Social Connections: Socially Integrated (04/17/2024)   Social Connection and Isolation Panel    Frequency of Communication with Friends and Family: More than three times a week    Frequency of Social Gatherings with Friends and Family: More than three times a week    Attends Religious Services: More than 4 times per year    Active Member of Clubs or Organizations: Yes    Attends Banker Meetings: More than 4 times per year    Marital Status: Married  Catering manager Violence: Not At Risk (04/17/2024)   Humiliation, Afraid, Rape, and Kick questionnaire    Fear of Current or Ex-Partner: No    Emotionally Abused: No    Physically Abused: No    Sexually Abused: No    Lab Results  Component Value Date   HGBA1C 7.0 (A) 06/09/2024   HGBA1C 7.2 (A) 02/10/2024   HGBA1C 6.6 (H) 09/03/2023   Lab Results  Component Value Date   CHOL 145 04/08/2024   Lab Results  Component Value Date   HDL 41 04/08/2024   Lab Results  Component Value Date   LDLCALC 83 04/08/2024   Lab Results  Component Value Date   TRIG 113 04/08/2024   Lab Results  Component Value Date   CHOLHDL 3.5 04/08/2024   Lab Results  Component Value Date   CREATININE 1.23 04/08/2024   Lab Results  Component Value Date   GFR 70.28 09/03/2023   No results found for: MACKEY CURRENT     Component Value Date/Time   NA 140 04/08/2024 0718   K 4.7 04/08/2024 0718   CL 101 04/08/2024 0718   CO2 25 04/08/2024 0718   GLUCOSE 122 (H) 04/08/2024 0718   GLUCOSE 119 (H) 09/03/2023 0813   GLUCOSE 94 12/17/2006 0845   BUN 18 04/08/2024 0718   CREATININE 1.23 04/08/2024 0718   CREATININE 0.99 09/27/2017 1626   CALCIUM  9.5 04/08/2024 0718   PROT 6.4 09/03/2023 0813    ALBUMIN 4.1 09/03/2023 0813   AST 30 09/03/2023 0813   ALT 26 04/08/2024 0718   ALKPHOS 71 09/03/2023 0813   BILITOT 1.1 09/03/2023 0813   GFRNONAA >60 05/29/2017 0858   GFRAA >60 05/29/2017 0858      Latest Ref Rng & Units 04/08/2024    7:18 AM 09/03/2023    8:13 AM 09/02/2023    9:51 AM  BMP  Glucose 70 - 99 mg/dL 877  880  871   BUN 8 - 27 mg/dL 18  18  22    Creatinine 0.76 - 1.27 mg/dL 8.76  8.91  8.95   BUN/Creat Ratio 10 - 24 15   21    Sodium 134 - 144 mmol/L 140  139  139   Potassium 3.5 - 5.2 mmol/L 4.7  4.3  4.4   Chloride 96 - 106 mmol/L 101  104  103   CO2 20 - 29 mmol/L 25  27  21    Calcium  8.6 - 10.2 mg/dL 9.5  9.3  89.9        Component Value Date/Time   WBC 5.1 04/20/2024 0755   RBC 4.49 04/20/2024 0755   HGB 15.3 04/20/2024 0755  HCT 44.8 04/20/2024 0755   PLT 169.0 04/20/2024 0755   MCV 99.8 04/20/2024 0755   MCH 31.1 09/27/2017 1626   MCHC 34.2 04/20/2024 0755   RDW 13.5 04/20/2024 0755   LYMPHSABS 1.4 04/20/2024 0755   MONOABS 0.5 04/20/2024 0755   EOSABS 0.4 04/20/2024 0755   BASOSABS 0.1 04/20/2024 0755     Parts of this note may have been dictated using voice recognition software. There may be variances in spelling and vocabulary which are unintentional. Not all errors are proofread. Please notify the dino if any discrepancies are noted or if the meaning of any statement is not clear.

## 2024-06-09 NOTE — Patient Instructions (Signed)
 Will recommend for the following change of medications to: Humalog  14 units before breakfast, 16 before lunch and 16 before dinner ac 15 min before meals (take 4 units less if eating salad/fish) Tresiba  24 units daily at bedtime  ___________   Goals of DM therapy:  Morning Fasting blood sugar: 80-140  Blood sugar before meals: 80-140 Bed time blood sugar: 100-150  A1C <7%, limited only by hypoglycemia  1.Diabetes medications and their side effects discussed, including hypoglycemia    2. Check blood glucose:  a) Always check blood sugars before driving. Please see below (under hypoglycemia) on how to manage b) Check a minimum of 3 times/day or more as needed when having symptoms of hypoglycemia.   c) Try to check blood glucose before sleeping/in the middle of the night to ensure that it is remaining stable and not dropping less than 100 d) Check blood glucose more often if sick  3. Diet: a) 3 meals per day schedule b: Restrict carbs to 60-70 grams (4 servings) per meal c) Colorful vegetables - 3 servings a day, and low sugar fruit 2 servings/day Plate control method: 1/4 plate protein, 1/4 starch, 1/2 green, yellow, or red vegetables d) Avoid carbohydrate snacks unless hypoglycemic episode, or increased physical activity  4. Regular exercise as tolerated, preferably 3 or more hours a week  5. Hypoglycemia: a)  Do not drive or operate machinery without first testing blood glucose to assure it is over 90 mg%, or if dizzy, lightheaded, not feeling normal, etc, or  if foot or leg is numb or weak. b)  If blood glucose less than 70, take four 5gm Glucose tabs or 15-30 gm Glucose gel.  Repeat every 15 min as needed until blood sugar is >100 mg/dl. If hypoglycemia persists then call 911.   6. Sick day management: a) Check blood glucose more often b) Continue usual therapy if blood sugars are elevated.   7. Contact the doctor immediately if blood glucose is frequently <60 mg/dl, or an  episode of severe hypoglycemia occurs (where someone had to give you glucose/  glucagon  or if you passed out from a low blood glucose), or if blood glucose is persistently >350 mg/dl, for further management  8. A change in level of physical activity or exercise and a change in diet may also affect your blood sugar. Check blood sugars more often and call if needed.  Instructions: 1. Bring glucose meter, blood glucose records on every visit for review 2. Continue to follow up with primary care physician and other providers for medical care 3. Yearly eye  and foot exam 4. Please get blood work done prior to the next appointment

## 2024-07-07 ENCOUNTER — Ambulatory Visit (INDEPENDENT_AMBULATORY_CARE_PROVIDER_SITE_OTHER): Payer: Medicare Other

## 2024-07-07 DIAGNOSIS — E538 Deficiency of other specified B group vitamins: Secondary | ICD-10-CM | POA: Diagnosis not present

## 2024-07-07 MED ORDER — CYANOCOBALAMIN 1000 MCG/ML IJ SOLN
1000.0000 ug | Freq: Once | INTRAMUSCULAR | Status: AC
  Administered 2024-07-07: 1000 ug via INTRAMUSCULAR

## 2024-07-07 NOTE — Progress Notes (Signed)
Per orders of Dr. Elsie Stain, injection of B-12 given by Francella Solian in right deltoid. Patient tolerated injection well. Patient will make appointment for 1 month.

## 2024-07-23 ENCOUNTER — Encounter: Payer: Self-pay | Admitting: Family Medicine

## 2024-07-23 ENCOUNTER — Other Ambulatory Visit: Payer: Self-pay

## 2024-07-23 DIAGNOSIS — R062 Wheezing: Secondary | ICD-10-CM

## 2024-07-23 MED ORDER — ALBUTEROL SULFATE HFA 108 (90 BASE) MCG/ACT IN AERS: 2.0000 | INHALATION_SPRAY | Freq: Four times a day (QID) | RESPIRATORY_TRACT | 1 refills | Status: DC | PRN

## 2024-08-03 DIAGNOSIS — H53143 Visual discomfort, bilateral: Secondary | ICD-10-CM | POA: Diagnosis not present

## 2024-08-03 DIAGNOSIS — E119 Type 2 diabetes mellitus without complications: Secondary | ICD-10-CM | POA: Diagnosis not present

## 2024-08-03 DIAGNOSIS — H2513 Age-related nuclear cataract, bilateral: Secondary | ICD-10-CM | POA: Diagnosis not present

## 2024-08-04 ENCOUNTER — Encounter: Payer: Self-pay | Admitting: Internal Medicine

## 2024-08-04 ENCOUNTER — Ambulatory Visit (INDEPENDENT_AMBULATORY_CARE_PROVIDER_SITE_OTHER): Admitting: Internal Medicine

## 2024-08-04 VITALS — BP 122/74 | HR 72 | Temp 98.3°F | Ht 68.0 in | Wt 168.0 lb

## 2024-08-04 DIAGNOSIS — R053 Chronic cough: Secondary | ICD-10-CM

## 2024-08-04 MED ORDER — DOXYCYCLINE HYCLATE 100 MG PO TABS: 100.0000 mg | ORAL_TABLET | Freq: Two times a day (BID) | ORAL | 0 refills | Status: DC

## 2024-08-04 NOTE — Progress Notes (Signed)
 Subjective:    Patient ID: Ryan Crane, male    DOB: 1956/11/05, 68 y.o.   MRN: [id number]  HPI Here due to cough  Cough for over a week--thought it would get better but hasn't Feels it in chest and feels sinus drainage Some headache with the cough No fever, chills or sweats Some SOB with the humidity--not worse in the past week No sore throat or ear pain  Tried flonase --helped a little Better at night with elevating his head No other meds  No test for COVID  Current Outpatient Medications on File Prior to Visit  Medication Sig Dispense Refill   albuterol  (VENTOLIN  HFA) 108 (90 Base) MCG/ACT inhaler Inhale 2 puffs into the lungs every 6 (six) hours as needed for wheezing or shortness of breath. 8 g 1   aspirin 81 MG tablet Take 81 mg by mouth daily.     atorvastatin  (LIPITOR) 40 MG tablet Take 1 tablet (40 mg total) by mouth daily. 90 tablet 3   Blood Glucose Monitoring Suppl (ONETOUCH VERIO REFLECT) w/Device KIT USE TO TEST BLOOD SUGARS AS DIRECTED 7 TIMES A DAY.  DX code,E11.649 1 kit 0   Continuous Glucose Sensor (DEXCOM G7 SENSOR) MISC USE AS DIRECTED 9 each 0   cyanocobalamin  (,VITAMIN B-12,) 1000 MCG/ML injection Inject 1,000 mcg into the muscle every 30 (thirty) days.     diclofenac  sodium (VOLTAREN ) 1 % GEL Apply 2 g topically 4 (four) times daily as needed. 100 g 12   fenofibrate  160 MG tablet Take 1 tablet (160 mg total) by mouth daily. 90 tablet 3   fluticasone  (FLONASE ) 50 MCG/ACT nasal spray Place 1 spray into both nostrils daily.     Glucagon  (GVOKE HYPOPEN  1-PACK) 1 MG/0.2ML SOAJ Inject 1 mg into the skin as needed (low blood sugar with impaired consciousness). 0.4 mL 2   glucose blood (ONETOUCH VERIO) test strip USE TO TEST BLOOD SUGARS AS DIRECTED 7 TIMES A DAY.  DX code,E11.649 700 each 3   insulin  degludec (TRESIBA  FLEXTOUCH) 100 UNIT/ML FlexTouch Pen INJECT 24 UNITS DAILY 15 mL 5   insulin  lispro (HUMALOG  KWIKPEN) 100 UNIT/ML KwikPen Humalog  14 units before  breakfast, 15 before lunch and 16 before dinner ac 60 mL 3   Insulin  Pen Needle (BD PEN NEEDLE NANO 2ND GEN) 32G X 4 MM MISC USE AS DIRECTED 4 TIMES A DAY 300 each 3   Insulin  Syringe-Needle U-100 30G X 1/2 0.5 ML MISC 1 each by Does not apply route daily. E11.9 90 each 0   levothyroxine  (SYNTHROID ) 137 MCG tablet Take 1 tablet (137 mcg total) by mouth daily. 90 tablet 3   lisinopril  (ZESTRIL ) 20 MG tablet Take 1 tablet (20 mg total) by mouth daily. 90 tablet 3   omeprazole  (PRILOSEC) 40 MG capsule Take 1 capsule (40 mg total) by mouth daily. 90 capsule 3   OneTouch Delica Lancets 33G MISC USE TO TEST BLOOD SUGARS AS DIRECTED 7 TIMES A DAY.  DX code,E11.649 700 each 3   oxyCODONE -acetaminophen  (PERCOCET/ROXICET) 5-325 MG tablet Take 1-2 tablets by mouth every 6 (six) hours as needed (for kidney stones). 10 tablet 0   No current facility-administered medications on file prior to visit.    Allergies  Allergen Reactions   Flomax  [Tamsulosin  Hcl]     nausea    Past Medical History:  Diagnosis Date   Allergy    Arthritis    Cholelithiasis    symptomatic   Diabetes mellitus (HCC)    Family  history of malignant neoplasm of gastrointestinal tract    Fatty liver    GERD (gastroesophageal reflux disease)    Graves disease    s/p ablation   Hemochromatosis    History of chicken pox    History of hiatal hernia    History of kidney stones    Hyperlipidemia    Hypertension    Internal hemorrhoids    Kidney stones     Personal history of colonic polyps 03/31/2008   tubular adenoma   Right fibular fracture 2013    Past Surgical History:  Procedure Laterality Date   CHOLECYSTECTOMY N/A 05/29/2017   Procedure: LAPAROSCOPIC CHOLECYSTECTOMY;  Surgeon: Signe Mitzie LABOR, MD;  Location: [facility] OR;  Service: General;  Laterality: N/A;   COLONOSCOPY W/ BIOPSIES AND POLYPECTOMY     LITHOTRIPSY     URETERAL STENT PLACEMENT     left    Family History  Problem Relation Age of Onset   COPD  Mother    Arthritis Mother    Diabetes Mother    Hypertension Mother    Lung cancer Father    Colon cancer Maternal Grandmother 39   Prostate cancer Neg Hx    Esophageal cancer Neg Hx    Rectal cancer Neg Hx    Stomach cancer Neg Hx    Thyroid  disease Neg Hx    Colon polyps Neg Hx     Social History   Socioeconomic History   Marital status: Married    Spouse name: Not on file   Number of children: 1   Years of education: Not on file   Highest education level: Not on file  Occupational History   Occupation: PROJECT MANAGER    Employer: [employer]  Tobacco Use   Smoking status: Never   Smokeless tobacco: Never  Vaping Use   Vaping status: Never Used  Substance and Sexual Activity   Alcohol use: No    Alcohol/week: 0.0 standard drinks of alcohol   Drug use: No   Sexual activity: Yes  Other Topics Concern   Not on file  Social History Narrative   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 [employer]   Married 1977   1 daughter   Social Drivers of Corporate investment banker Strain: Low Risk  (04/17/2024)   Overall Financial Resource Strain (CARDIA)    Difficulty of Paying Living Expenses: Not hard at all  Food Insecurity: No Food Insecurity (04/17/2024)   Hunger Vital Sign    Worried About Running Out of Food in the Last Year: Never true    Ran Out of Food in the Last Year: Never true  Transportation Needs: No Transportation Needs (04/17/2024)   PRAPARE - Administrator, Civil Service (Medical): No    Lack of Transportation (Non-Medical): No  Physical Activity: Insufficiently Active (04/17/2024)   Exercise Vital Sign    Days of Exercise per Week: 3 days    Minutes of Exercise per Session: 30 min  Stress: No Stress Concern Present (04/17/2024)   [employer] of Occupational Health - Occupational Stress Questionnaire    Feeling of Stress : Not at all  Social Connections: Socially Integrated (04/17/2024)   Social Connection and Isolation Panel    Frequency of  Communication with Friends and Family: More than three times a week    Frequency of Social Gatherings with Friends and Family: More than three times a week    Attends Religious Services: More than 4 times per year  Active Member of Clubs or Organizations: Yes    Attends Banker Meetings: More than 4 times per year    Marital Status: Married  Catering manager Violence: Not At Risk (04/17/2024)   Humiliation, Afraid, Rape, and Kick questionnaire    Fear of Current or Ex-Partner: No    Emotionally Abused: No    Physically Abused: No    Sexually Abused: No   Review of Systems No N/V Appetite off--but able to eat     Objective:   Physical Exam Constitutional:      Appearance: Normal appearance.  HENT:     Head:     Comments: No sinus tenderness    Right Ear: Tympanic membrane and ear canal normal.     Left Ear: Tympanic membrane and ear canal normal.     Mouth/Throat:     Pharynx: No oropharyngeal exudate or posterior oropharyngeal erythema.  Pulmonary:     Effort: Pulmonary effort is normal.     Breath sounds: Normal breath sounds. No wheezing or rales.  Musculoskeletal:     Cervical back: Neck supple.  Lymphadenopathy:     Cervical: No cervical adenopathy.  Neurological:     Mental Status: He is alert.            Assessment & Plan:

## 2024-08-04 NOTE — Assessment & Plan Note (Signed)
 Probably started as viral infection but now 8-9 days Will treat empirically for sinusitis with doxy 100  bid x 7 days Continue flonase  Tylenol  prn

## 2024-08-10 ENCOUNTER — Encounter: Payer: Self-pay | Admitting: Internal Medicine

## 2024-08-11 ENCOUNTER — Ambulatory Visit (INDEPENDENT_AMBULATORY_CARE_PROVIDER_SITE_OTHER): Payer: Medicare Other

## 2024-08-11 DIAGNOSIS — E538 Deficiency of other specified B group vitamins: Secondary | ICD-10-CM | POA: Diagnosis not present

## 2024-08-11 MED ORDER — CYANOCOBALAMIN 1000 MCG/ML IJ SOLN
1000.0000 ug | Freq: Once | INTRAMUSCULAR | Status: AC
  Administered 2024-08-11: 1000 ug via INTRAMUSCULAR

## 2024-08-11 NOTE — Telephone Encounter (Signed)
 Patient was evaluated by Dr. Letvak for this recently. Do you want them to make appointment?

## 2024-08-11 NOTE — Progress Notes (Signed)
 Per orders of Dr. Crawford Givens, injection of vitamin b 12 given by Lewanda Rife in left deltoid. Patient tolerated injection well. Patient will make appointment for 1 month.

## 2024-08-14 ENCOUNTER — Ambulatory Visit (INDEPENDENT_AMBULATORY_CARE_PROVIDER_SITE_OTHER): Admitting: Family Medicine

## 2024-08-14 ENCOUNTER — Encounter: Payer: Self-pay | Admitting: Family Medicine

## 2024-08-14 VITALS — BP 102/58 | HR 68 | Temp 98.3°F | Ht 68.0 in | Wt 168.2 lb

## 2024-08-14 DIAGNOSIS — R059 Cough, unspecified: Secondary | ICD-10-CM

## 2024-08-14 MED ORDER — BENZONATATE 200 MG PO CAPS: 200.0000 mg | ORAL_CAPSULE | Freq: Three times a day (TID) | ORAL | 1 refills | Status: DC | PRN

## 2024-08-14 MED ORDER — HYDROCODONE BIT-HOMATROP MBR 5-1.5 MG/5ML PO SOLN: 5.0000 mL | Freq: Three times a day (TID) | ORAL | 0 refills | Status: AC | PRN

## 2024-08-14 NOTE — Progress Notes (Signed)
 Dry cough for about 3 weeks at this point.  Finishing doxy rx soon. No ADE on med.  On ACE.    Since last OV, he had early AM and late PM cough, comes in fits.  Then can go for interval w/o cough.  Minimal sputum.  No ST.  Mild sinus drainage.  Using flonase .  Some occ/rare wheeze, during the coughing fits.  SABA doesn't help.  Cough is slowly getting better.  Halls cough drops helps some.  No fever or chills but he did have gagging episode after cough.    He had been putting up fiberglass insulation in his shop, used a mask with that.    Meds, vitals, and allergies reviewed.   ROS: Per HPI unless specifically indicated in ROS section   Nad Ncat TM wnl B Sinuses not ttp x4 MMM Neck supple, no LA Ctab Rrr No focal dec in BS, no wheeze.   Skin well perfused.

## 2024-08-14 NOTE — Patient Instructions (Signed)
 Try tessalon  first and then hycodan if needed.  Update me as needed.  Take care.  Glad to see you.

## 2024-08-16 NOTE — Assessment & Plan Note (Signed)
 Slowly getting better, ctab and okay for outpatient f/u.  Ddx d/w pt.  Not likely from ACE.  Try tessalon  first and then hycodan if needed.  Sedation caution.  Okay to defer xray given exam and gradual improvement.   Update me as needed.  He agrees with plan.

## 2024-08-22 ENCOUNTER — Other Ambulatory Visit: Payer: Self-pay | Admitting: "Endocrinology

## 2024-09-01 ENCOUNTER — Other Ambulatory Visit: Payer: Self-pay | Admitting: "Endocrinology

## 2024-09-01 ENCOUNTER — Other Ambulatory Visit: Payer: Self-pay | Admitting: Family Medicine

## 2024-09-01 DIAGNOSIS — R062 Wheezing: Secondary | ICD-10-CM

## 2024-09-01 DIAGNOSIS — E119 Type 2 diabetes mellitus without complications: Secondary | ICD-10-CM

## 2024-09-04 ENCOUNTER — Encounter: Payer: Self-pay | Admitting: Family Medicine

## 2024-09-15 ENCOUNTER — Ambulatory Visit (INDEPENDENT_AMBULATORY_CARE_PROVIDER_SITE_OTHER)

## 2024-09-15 DIAGNOSIS — E538 Deficiency of other specified B group vitamins: Secondary | ICD-10-CM | POA: Diagnosis not present

## 2024-09-15 MED ORDER — CYANOCOBALAMIN 1000 MCG/ML IJ SOLN
1000.0000 ug | Freq: Once | INTRAMUSCULAR | Status: AC
  Administered 2024-09-15: 1000 ug via INTRAMUSCULAR

## 2024-09-15 NOTE — Progress Notes (Signed)
Per orders of Dr. Crawford Givens, injection of B-12 given by Melina Copa in right deltoid. Patient tolerated injection well. Patient will make appointment for 1 month.

## 2024-09-23 NOTE — Progress Notes (Signed)
 Ryan Crane                                          MRN: [id number]   09/23/2024   The VBCI Quality Team Specialist reviewed this patient medical record for the purposes of chart review for care gap closure. The following were reviewed: abstraction for care gap closure-glycemic status assessment.    VBCI Quality Team

## 2024-09-23 NOTE — Progress Notes (Signed)
 CURBY CARSWELL                                          MRN: [id number]   09/23/2024   The VBCI Quality Team Specialist reviewed this patient medical record for the purposes of chart review for care gap closure. The following were reviewed: chart review for care gap closure-kidney health evaluation for diabetes:eGFR  and uACR.    VBCI Quality Team

## 2024-10-07 ENCOUNTER — Encounter: Payer: Self-pay | Admitting: "Endocrinology

## 2024-10-08 ENCOUNTER — Other Ambulatory Visit (HOSPITAL_COMMUNITY): Payer: Self-pay

## 2024-10-08 ENCOUNTER — Telehealth: Payer: Self-pay

## 2024-10-08 DIAGNOSIS — E119 Type 2 diabetes mellitus without complications: Secondary | ICD-10-CM

## 2024-10-08 NOTE — Telephone Encounter (Signed)
 Pharmacy Patient Advocate Encounter   Received notification from Patient Advice Request messages requesting assistance for patient's glucose meter and supplies.    Insurance verification completed.   The patient is insured through [facility].   It is my understanding that the patient was prescribed Dexcom but it seems he no longer wishes to use it. In the past he was using OneTouch Verio for his testing supplies. He is correct that his insurance no longer covers that brand and prefers Accu-chek. Test claim shows that PA is not required. Copay is $0 for a 30 day supply, it is showing that it needs to be billed to Part B.

## 2024-10-09 MED ORDER — ACCU-CHEK GUIDE TEST VI STRP: ORAL_STRIP | 12 refills | Status: AC

## 2024-10-09 MED ORDER — ACCU-CHEK SOFTCLIX LANCETS MISC: 3 refills | Status: AC

## 2024-10-09 MED ORDER — ACCU-CHEK GUIDE W/DEVICE KIT: PACK | 0 refills | Status: AC

## 2024-10-09 NOTE — Addendum Note (Signed)
 Addended by: ARLOA JEOFFREY SAILOR on: 10/09/2024 11:29 AM   Modules accepted: Orders

## 2024-10-20 ENCOUNTER — Ambulatory Visit

## 2024-10-20 DIAGNOSIS — E538 Deficiency of other specified B group vitamins: Secondary | ICD-10-CM

## 2024-10-20 MED ORDER — CYANOCOBALAMIN 1000 MCG/ML IJ SOLN
1000.0000 ug | Freq: Once | INTRAMUSCULAR | Status: AC
  Administered 2024-10-20: 1000 ug via INTRAMUSCULAR

## 2024-10-20 NOTE — Progress Notes (Signed)
Per orders of Dr. Crawford Givens, injection of B-12 given by Melina Copa in left deltoid. Patient tolerated injection well. Patient will make appointment for 1 month.

## 2024-11-08 ENCOUNTER — Encounter: Payer: Self-pay | Admitting: "Endocrinology

## 2024-11-08 ENCOUNTER — Encounter: Payer: Self-pay | Admitting: Family Medicine

## 2024-11-12 ENCOUNTER — Ambulatory Visit: Admitting: "Endocrinology

## 2024-11-12 ENCOUNTER — Encounter: Payer: Self-pay | Admitting: "Endocrinology

## 2024-11-12 DIAGNOSIS — E782 Mixed hyperlipidemia: Secondary | ICD-10-CM

## 2024-11-12 DIAGNOSIS — E119 Type 2 diabetes mellitus without complications: Secondary | ICD-10-CM

## 2024-11-12 DIAGNOSIS — Z794 Long term (current) use of insulin: Secondary | ICD-10-CM

## 2024-11-12 LAB — POCT GLYCOSYLATED HEMOGLOBIN (HGB A1C): Hemoglobin A1C: 6.8 % — AB (ref 4.0–5.6)

## 2024-11-12 NOTE — Progress Notes (Signed)
 Outpatient Endocrinology Note Ryan Birmingham, MD  11/12/24   Ryan Crane [place] Apr 25, 1956 [id number]  Referring Provider: Cleatus Arlyss RAMAN, MD Primary Care Provider: Cleatus Arlyss RAMAN, MD Reason for consultation: Subjective   Assessment & Plan  Diagnoses and all orders for this visit:  Controlled type 2 diabetes mellitus without complication, with long-term current use of insulin  (HCC) -     POCT glycosylated hemoglobin (Hb A1C) -     Microalbumin / creatinine urine ratio  Long-term insulin  use (HCC)  Insulin  dose changed (HCC)  Mixed hypercholesterolemia and hypertriglyceridemia   Hemochromatosis induced diabetes  Hba1c goal less than 7.0, current Hba1c is  Lab Results  Component Value Date   HGBA1C 6.8 (A) 11/12/2024   HGBA1C 7.0 (A) 06/09/2024   HGBA1C 7.2 (A) 02/10/2024    Will recommend for the following change of medications to: Tresiba  24 units daily qhs Humalog  14 units before breakfast, 16 before lunch and 16 before dinner ac 15 min before meals (take 4 units less if eating salad/fish) Humalog  correction scale: Use in addition to your meal time/short acting insulin  based on blood sugars as follows:  151 - 175: 1 unit 176 - 200: 2 units 201 - 225: 3 units 226 - 250: 4 units 251 - 275: 5 units 276 - 300: 6 units 301 - 325: 7 units 326 - 350: 8 units 351 - 375: 9 units 376 - 400: 10 units   Has CGM-sent Dexcom G7 for a try: couldn't sleep with it   No known contraindications to any of above medications Glucagon  prescribed 05/10/23  Hyperlipidemia -Last LDL off goal: 83, Tg 113 -on atorvastatin  40 mg every day, fenofibrate  160 mg every day 11/12/24: Patient is going to try atorvastatin  20 mg every day to see if that resolves his cramps.  If not, patient will try it every other day or every second/third day and let me know which dose works for him -Follow low fat diet and exercise   -Blood pressure goal <140/90 - Microalbumin/creatinine at goal <  30 - on ACE/ARB lisinopril  40 mg qd -diet changes including salt restriction -limit eating outside -counseled BP targets per standards of diabetes care -Uncontrolled blood pressure can lead to retinopathy, nephropathy and cardiovascular and atherosclerotic heart disease  Reviewed and counseled on: -A1C target -Blood sugar targets -Complications of uncontrolled diabetes  -Checking blood sugar before meals and bedtime and bring log next visit -All medications with mechanism of action and side effects -Hypoglycemia management: rule of 15's, Glucagon  Emergency Kit and medical alert ID -low-carb low-fat plate-method diet -At least 20 minutes of physical activity per day -Annual dilated retinal eye exam and foot exam -compliance and follow up needs -follow up as scheduled or earlier if problem gets worse  Call if blood sugar is less than 70 or consistently above 250    Take a 15 gm snack of carbohydrate at bedtime before you go to sleep if your blood sugar is less than 100.    If you are going to fast after midnight for a test or procedure, ask your physician for instructions on how to reduce/decrease your insulin  dose.    Call if blood sugar is less than 70 or consistently above 250  -Treating a low sugar by rule of 15  (15 gms of sugar every 15 min until sugar is more than 70) If you feel your sugar is low, test your sugar to be sure If your sugar is low (less than 70),  then take 15 grams of a fast acting Carbohydrate (3-4 glucose tablets or glucose gel or 4 ounces of juice or regular soda) Recheck your sugar 15 min after treating low to make sure it is more than 70 If sugar is still less than 70, treat again with 15 grams of carbohydrate          Don't drive the hour of hypoglycemia  If unconscious/unable to eat or drink by mouth, use glucagon  injection or nasal spray baqsimi and call 911. Can repeat again in 15 min if still unconscious.  Return in about 3 months (around 02/10/2025)  for visit, labs today.   I have reviewed current medications, nurse's notes, allergies, vital signs, past medical and surgical history, family medical history, and social history for this encounter. Counseled patient on symptoms, examination findings, lab findings, imaging results, treatment decisions and monitoring and prognosis. The patient understood the recommendations and agrees with the treatment plan. All questions regarding treatment plan were fully answered.  Ryan Birmingham, MD  11/12/24    History of Present Illness Ryan Crane is a 68 y.o. year old male who presents for follow up of Hemochromatosis induced diabetes.  Ryan Crane was first diagnosed in 2013.   Diabetes education +  Home diabetes regimen: Tresiba  24 units daily qhs Humalog  14 units before breakfast, 16 before lunch and 16 before dinner ac 15 min before meals (take 4 units less if eating salad/fish) Humalog  correction scale: Use in addition to your meal time/short acting insulin  based on blood sugars as follows:  151 - 175: 1 unit 176 - 200: 2 units 201 - 225: 3 units 226 - 250: 4 units 251 - 275: 5 units 276 - 300: 6 units 301 - 325: 7 units 326 - 350: 8 units 351 - 375: 9 units 376 - 400: 10 units   Previous history: Non-insulin  hypoglycemic drugs previously used: Metformin , glipizide , Januvia  Insulin  was started in 2019  COMPLICATIONS -  MI/Stroke -  retinopathy -  neuropathy -  nephropathy  BLOOD SUGAR DATA Per meter, 58-255 Checks BG 4 times a day  Physical Exam  There were no vitals taken for this visit.   Constitutional: well developed, well nourished Head: normocephalic, atraumatic Eyes: sclera anicteric, no redness Neck: supple Lungs: normal respiratory effort Neurology: alert and oriented Skin: dry, no appreciable rashes Musculoskeletal: no appreciable defects Psychiatric: normal mood and affect Diabetic Foot Exam - Simple   No data filed      Current  Medications Patient's Medications  New Prescriptions   No medications on file  Previous Medications   ACCU-CHEK SOFTCLIX LANCETS LANCETS    Use as instructed 1x a day   ALBUTEROL  (VENTOLIN  HFA) 108 (90 BASE) MCG/ACT INHALER    TAKE 2 PUFFS BY MOUTH EVERY 6 HOURS AS NEEDED FOR WHEEZE OR SHORTNESS OF BREATH   ASPIRIN 81 MG TABLET    Take 81 mg by mouth daily.   ATORVASTATIN  (LIPITOR) 40 MG TABLET    Take 1 tablet (40 mg total) by mouth daily.   BENZONATATE  (TESSALON ) 200 MG CAPSULE    Take 1 capsule (200 mg total) by mouth 3 (three) times daily as needed for cough.   BLOOD GLUCOSE MONITORING SUPPL (ACCU-CHEK GUIDE) W/DEVICE KIT    Use as advised   CETIRIZINE (ZYRTEC ALLERGY) 10 MG TABLET    Take 10 mg by mouth daily.   CONTINUOUS GLUCOSE SENSOR (DEXCOM G7 SENSOR) MISC    USE AS DIRECTED   CYANOCOBALAMIN  (,  VITAMIN B-12,) 1000 MCG/ML INJECTION    Inject 1,000 mcg into the muscle every 30 (thirty) days.   DICLOFENAC  SODIUM (VOLTAREN ) 1 % GEL    Apply 2 g topically 4 (four) times daily as needed.   DOXYCYCLINE  (VIBRA -TABS) 100 MG TABLET    Take 1 tablet (100 mg total) by mouth 2 (two) times daily.   FENOFIBRATE  160 MG TABLET    Take 1 tablet (160 mg total) by mouth daily.   FLUTICASONE  (FLONASE ) 50 MCG/ACT NASAL SPRAY    Place 1 spray into both nostrils daily.   GLUCAGON  (GVOKE HYPOPEN  1-PACK) 1 MG/0.2ML SOAJ    INJECT 1 MG INTO THE SKIN AS NEEDED (LOW BLOOD SUGAR WITH IMPAIRED CONSCIOUSNESS).   GLUCOSE BLOOD (ACCU-CHEK GUIDE TEST) TEST STRIP    Use as instructed   HYDROCODONE  BIT-HOMATROPINE (HYCODAN) 5-1.5 MG/5ML SYRUP    Take 5 mLs by mouth every 8 (eight) hours as needed for cough. Sedation caution. Don't take with oxycodone .   INSULIN  DEGLUDEC (TRESIBA  FLEXTOUCH) 100 UNIT/ML FLEXTOUCH PEN    INJECT 24 UNITS DAILY   INSULIN  LISPRO (HUMALOG  KWIKPEN) 100 UNIT/ML KWIKPEN    Humalog  14 units before breakfast, 15 before lunch and 16 before dinner ac   INSULIN  PEN NEEDLE (BD PEN NEEDLE NANO 2ND GEN)  32G X 4 MM MISC    USE AS DIRECTED 4 TIMES A DAY   INSULIN  SYRINGE-NEEDLE U-100 30G X 1/2 0.5 ML MISC    1 each by Does not apply route daily. E11.9   LEVOTHYROXINE  (SYNTHROID ) 137 MCG TABLET    Take 1 tablet (137 mcg total) by mouth daily.   LISINOPRIL  (ZESTRIL ) 20 MG TABLET    Take 1 tablet (20 mg total) by mouth daily.   OMEPRAZOLE  (PRILOSEC) 40 MG CAPSULE    Take 1 capsule (40 mg total) by mouth daily.   OXYCODONE -ACETAMINOPHEN  (PERCOCET/ROXICET) 5-325 MG TABLET    Take 1-2 tablets by mouth every 6 (six) hours as needed (for kidney stones).  Modified Medications   No medications on file  Discontinued Medications   No medications on file    Allergies Allergies  Allergen Reactions   Flomax  [Tamsulosin  Hcl]     nausea    Past Medical History Past Medical History:  Diagnosis Date   Allergy    Arthritis    Cholelithiasis    symptomatic   Diabetes mellitus (HCC)    Family history of malignant neoplasm of gastrointestinal tract    Fatty liver    GERD (gastroesophageal reflux disease)    Graves disease    s/p ablation   Hemochromatosis    History of chicken pox    History of hiatal hernia    History of kidney stones    Hyperlipidemia    Hypertension    Internal hemorrhoids    Kidney stones     Personal history of colonic polyps 03/31/2008   tubular adenoma   Right fibular fracture 2013    Past Surgical History Past Surgical History:  Procedure Laterality Date   CHOLECYSTECTOMY N/A 05/29/2017   Procedure: LAPAROSCOPIC CHOLECYSTECTOMY;  Surgeon: Signe Mitzie LABOR, MD;  Location: [facility] OR;  Service: General;  Laterality: N/A;   COLONOSCOPY W/ BIOPSIES AND POLYPECTOMY     LITHOTRIPSY     URETERAL STENT PLACEMENT     left    Family History family history includes Arthritis in his mother; COPD in his mother; Colon cancer (age of onset: 8) in his maternal grandmother; Diabetes in his mother; Hypertension in his  mother; Lung cancer in his father.  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 [employer]   Married 1977   1 daughter   Social Drivers of Corporate Investment Banker Strain: Low Risk  (04/17/2024)   Overall Financial Resource Strain (CARDIA)    Difficulty of Paying Living Expenses: Not hard at all  Food Insecurity: No Food Insecurity (04/17/2024)   Hunger Vital Sign    Worried About Running Out of Food in the Last Year: Never true    Ran Out of Food in the Last Year: Never true  Transportation Needs: No Transportation Needs (04/17/2024)   PRAPARE - Administrator, Civil Service (Medical): No    Lack of Transportation (Non-Medical): No  Physical Activity: Insufficiently Active (04/17/2024)   Exercise Vital Sign    Days of Exercise per Week: 3 days    Minutes of Exercise per Session: 30 min  Stress: No Stress Concern Present (04/17/2024)   [employer] of Occupational Health - Occupational Stress Questionnaire    Feeling of Stress : Not at all  Social Connections: Socially Integrated (04/17/2024)   Social Connection and Isolation Panel    Frequency of Communication with Friends and Family: More than three times a week    Frequency of Social Gatherings with Friends and Family: More than three times a week    Attends Religious Services: More than 4 times per year    Active Member of Clubs or Organizations: Yes    Attends Engineer, Structural: More than  4 times per year    Marital Status: Married  Catering Manager Violence: Not At Risk (04/17/2024)   Humiliation, Afraid, Rape, and Kick questionnaire    Fear of Current or Ex-Partner: No    Emotionally Abused: No    Physically Abused: No    Sexually Abused: No    Lab Results  Component Value Date   HGBA1C 6.8 (A) 11/12/2024   HGBA1C 7.0 (A) 06/09/2024   HGBA1C 7.2 (A) 02/10/2024   Lab Results  Component Value Date   CHOL 145 04/08/2024   Lab Results  Component Value Date   HDL 41 04/08/2024   Lab Results  Component Value Date   LDLCALC 83 04/08/2024   Lab Results  Component Value Date   TRIG 113 04/08/2024   Lab Results  Component Value Date   CHOLHDL 3.5 04/08/2024   Lab Results  Component Value Date   CREATININE 1.23 04/08/2024   Lab Results  Component Value Date   GFR 70.28 09/03/2023   No results found for: MACKEY CURRENT     Component Value Date/Time   NA 140 04/08/2024 0718   K 4.7 04/08/2024 0718   CL 101 04/08/2024 0718   CO2 25 04/08/2024 0718  GLUCOSE 122 (H) 04/08/2024 0718   GLUCOSE 119 (H) 09/03/2023 0813   GLUCOSE 94 12/17/2006 0845   BUN 18 04/08/2024 0718   CREATININE 1.23 04/08/2024 0718   CREATININE 0.99 09/27/2017 1626   CALCIUM  9.5 04/08/2024 0718   PROT 6.4 09/03/2023 0813   ALBUMIN 4.1 09/03/2023 0813   AST 30 09/03/2023 0813   ALT 26 04/08/2024 0718   ALKPHOS 71 09/03/2023 0813   BILITOT 1.1 09/03/2023 0813   GFRNONAA >60 05/29/2017 0858   GFRAA >60 05/29/2017 0858      Latest Ref Rng & Units 04/08/2024    7:18 AM 09/03/2023    8:13 AM 09/02/2023    9:51 AM  BMP  Glucose 70 - 99 mg/dL 877  880  871   BUN 8 - 27 mg/dL 18  18  22    Creatinine 0.76 - 1.27 mg/dL 8.76  8.91  8.95   BUN/Creat Ratio 10 - 24 15   21    Sodium 134 - 144 mmol/L 140  139  139   Potassium 3.5 - 5.2 mmol/L 4.7  4.3  4.4   Chloride 96 - 106 mmol/L 101  104  103   CO2 20 - 29 mmol/L 25  27  21    Calcium  8.6 - 10.2 mg/dL 9.5  9.3  89.9         Component Value Date/Time   WBC 5.1 04/20/2024 0755   RBC 4.49 04/20/2024 0755   HGB 15.3 04/20/2024 0755   HCT 44.8 04/20/2024 0755   PLT 169.0 04/20/2024 0755   MCV 99.8 04/20/2024 0755   MCH 31.1 09/27/2017 1626   MCHC 34.2 04/20/2024 0755   RDW 13.5 04/20/2024 0755   LYMPHSABS 1.4 04/20/2024 0755   MONOABS 0.5 04/20/2024 0755   EOSABS 0.4 04/20/2024 0755   BASOSABS 0.1 04/20/2024 0755     Parts of this note may have been dictated using voice recognition software. There may be variances in spelling and vocabulary which are unintentional. Not all errors are proofread. Please notify the dino if any discrepancies are noted or if the meaning of any statement is not clear.

## 2024-11-12 NOTE — Patient Instructions (Signed)
 Home diabetes regimen: Tresiba  24 units daily qhs Humalog  14 units before breakfast, 16 before lunch and 16 before dinner ac 15 min before meals (take 4 units less if eating salad/fish) Humalog  correction scale: Use in addition to your meal time/short acting insulin  based on blood sugars as follows:  151 - 175: 1 unit 176 - 200: 2 units 201 - 225: 3 units 226 - 250: 4 units 251 - 275: 5 units 276 - 300: 6 units 301 - 325: 7 units 326 - 350: 8 units 351 - 375: 9 units 376 - 400: 10 units

## 2024-11-13 LAB — MICROALBUMIN / CREATININE URINE RATIO
Creatinine, Urine: 127 mg/dL (ref 20–320)
Microalb, Ur: <0.2 mg/dL

## 2024-11-16 ENCOUNTER — Encounter: Payer: Self-pay | Admitting: Family Medicine

## 2024-11-16 NOTE — Progress Notes (Signed)
 Ryan Crane                                          MRN: [id number]   11/16/2024   The VBCI Quality Team Specialist reviewed this patient medical record for the purposes of chart review for care gap closure. The following were reviewed: abstraction for care gap closure-glycemic status assessment and kidney health evaluation for diabetes:eGFR  and uACR.    VBCI Quality Team

## 2024-11-19 ENCOUNTER — Other Ambulatory Visit: Payer: Self-pay | Admitting: Family Medicine

## 2024-11-20 ENCOUNTER — Other Ambulatory Visit: Payer: Self-pay | Admitting: "Endocrinology

## 2024-11-24 ENCOUNTER — Other Ambulatory Visit

## 2024-11-24 ENCOUNTER — Ambulatory Visit

## 2024-11-24 DIAGNOSIS — E538 Deficiency of other specified B group vitamins: Secondary | ICD-10-CM

## 2024-11-24 MED ORDER — CYANOCOBALAMIN 1000 MCG/ML IJ SOLN
1000.0000 ug | Freq: Once | INTRAMUSCULAR | Status: AC
  Administered 2024-11-24: 15:00:00 1000 ug via INTRAMUSCULAR

## 2024-11-24 NOTE — Progress Notes (Signed)
 Per orders of Dr. Arlyss Solian, injection of B-12 given by Nellie Hummer in Right deltoid Patient tolerated injection well. Patient will make appointment for 1 month(s)

## 2024-11-25 LAB — CBC WITH DIFFERENTIAL/PLATELET
Basophils Absolute: 0 K/uL (ref 0.0–0.1)
Basophils Relative: 0.6 % (ref 0.0–3.0)
Eosinophils Absolute: 0.3 K/uL (ref 0.0–0.7)
Eosinophils Relative: 4.9 % (ref 0.0–5.0)
HCT: 42.9 % (ref 39.0–52.0)
Hemoglobin: 15.1 g/dL (ref 13.0–17.0)
Lymphocytes Relative: 24.6 % (ref 12.0–46.0)
Lymphs Abs: 1.5 K/uL (ref 0.7–4.0)
MCHC: 35.2 g/dL (ref 30.0–36.0)
MCV: 96.9 fl (ref 78.0–100.0)
Monocytes Absolute: 0.7 K/uL (ref 0.1–1.0)
Monocytes Relative: 11.1 % (ref 3.0–12.0)
Neutro Abs: 3.5 K/uL (ref 1.4–7.7)
Neutrophils Relative %: 58.8 % (ref 43.0–77.0)
Platelets: 196 K/uL (ref 150.0–400.0)
RBC: 4.42 Mil/uL (ref 4.22–5.81)
RDW: 13.4 % (ref 11.5–15.5)
WBC: 5.9 K/uL (ref 4.0–10.5)

## 2024-11-25 LAB — FERRITIN: Ferritin: 194.6 ng/mL (ref 22.0–322.0)

## 2024-12-09 ENCOUNTER — Other Ambulatory Visit: Payer: Self-pay | Admitting: Physician Assistant

## 2024-12-09 DIAGNOSIS — Z79899 Other long term (current) drug therapy: Secondary | ICD-10-CM

## 2024-12-09 DIAGNOSIS — E782 Mixed hyperlipidemia: Secondary | ICD-10-CM

## 2024-12-11 MED ORDER — ATORVASTATIN CALCIUM 40 MG PO TABS: 40.0000 mg | ORAL_TABLET | Freq: Every day | ORAL | 0 refills | Status: DC

## 2024-12-12 ENCOUNTER — Other Ambulatory Visit: Payer: Self-pay | Admitting: "Endocrinology

## 2024-12-24 NOTE — Progress Notes (Signed)
 " Cardiology Office Note   Date:  12/25/2024  ID:  Ryan Crane, DOB 21-Nov-1956, MRN [id number] PCP: Cleatus Arlyss RAMAN, MD  [facility] Providers Cardiologist:  Aleene Passe, MD (Inactive)   History of Present Illness Ryan Crane is a 69 y.o. male with a past medical history of hemochromatosis and Graves' disease here for follow-up appointment.  History of playing golf and becoming very winded walking up a hill.  No chest pain or pressure at that time.  Having gradually increasing over the past several years with shortness of breath.  Walks every day.  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  Family history of cardiac disease with his father being a heavy smoker and died of lung cancer in his mother dying of old age with dementia at age 41.  Ryan Crane was seen October 2024 and at that time recent echo in July 2024 revealed hyperdynamic LV function with EF 70 to 75%, trivial MR, mild aortic sclerosis with no AAS.  Mild TR and trivial PI.  Coronary CTA showed CAC score of 972 which is 87 percentile with RCA mild plaque, LAD mild calcified plaque, and LCx nondominant with mild plaque.  Having some DOE when it was hot but was doing better.  Walking on occasion.  Today, Ryan Crane presents today with a hx of diabetes and hyperlipidemia with statin-induced muscle cramps and shortness of breath.  Ryan Crane developed severe nocturnal leg cramps after his statin dose was increased from 20 mg to 40 mg, which improved with walking and resolved after stopping the statin for a couple of weeks. Ryan Crane resumed 20 mg without recurrence of cramps. Ryan Crane also had abdominal muscle pain that persisted until the statin dose was reduced, after which it resolved. His shortness of breath occurred during humid summer months but is now absent. Ryan Crane uses insulin  for diabetes and reports better blood sugar control since retirement with more regular walking and climbing.  Reports no shortness of breath nor dyspnea on exertion.  Reports no chest pain, pressure, or tightness. No edema, orthopnea, PND. Reports no palpitations.   Discussed the use of AI scribe software for clinical note transcription with the patient, who gave verbal consent to proceed.   ROS: pertinent ROS in HPI  Studies Reviewed EKG Interpretation Date/Time:  Friday December 25 2024 07:58:20 EST Ventricular Rate:  50 PR Interval:  168 QRS Duration:  80 QT Interval:  430 QTC Calculation: 392 R Axis:   38  Text Interpretation: Sinus bradycardia When compared with ECG of 02-Jul-2023 14:51, No significant change was found Confirmed by Lucien Blanc [id number]) on 12/25/2024 8:08:01 AM   CTA coronary 09/09/23  EXAM: FFRCT ANALYSIS for abnormal coronary CT-A.   FINDINGS: FFRct analysis was performed on the original cardiac CT angiogram dataset. Diagrammatic representation of the FFRct analysis is provided in a separate PDF document in PACS. This dictation was created using the PDF document and an interactive 3D model of the results. 3D model is not available in the EMR/PACS. Normal FFR range is >0.80.   1. Left Main: FFRct 1.0   2. LAD: FFRct 0.99 proximal, 0.91 mid, 0.7 distal   3. LCX: FFRct 0.99, 0.99 mid, 0.95 distal   4. RCA: FFRct 0.98 proximal, 0.95 mid, 0.95 distal   IMPRESSION: 1. FFRct is abnormal in the distal LAD. However, there is no discrete lesion in this location. Likely artifactual due to small distal vessel size.   2. Findings are consistent with non-obstructive coronary artery disease.  Tiffany C. Raford, MD     Electronically Signed   By: Annabella Raford M.D.   On: 09/09/2023 18:53     Physical Exam VS:  BP 116/80   Pulse (!) 59   Ht 5' 8 (1.727 m)   Wt 169 lb (76.7 kg)   SpO2 97%   BMI 25.70 kg/m        Wt Readings from Last 3 Encounters:  12/25/24 169 lb (76.7 kg)  08/14/24 168 lb 3.2 oz (76.3 kg)  08/04/24 168 lb (76.2 kg)    GEN: Well nourished, well developed in no acute distress NECK: No  JVD; No carotid bruits CARDIAC: RRR, no murmurs, rubs, gallops RESPIRATORY:  Clear to auscultation without rales, wheezing or rhonchi  ABDOMEN: Soft, non-tender, non-distended EXTREMITIES:  No edema; No deformity   ASSESSMENT AND PLAN  Mixed hyperlipidemia with statin intolerance Intolerance to higher doses of atorvastatin  due to severe leg cramps and muscle spasms. Symptoms resolved with dose reduction. Considering alternative lipid-lowering therapies. - Ordered lipid panel and liver function test. - Continue atorvastatin  20 mg until lab results are available. - Discuss non-statin options including Zetia, Repatha, Praluent, and Leqvio. - Consult with prior authorization team for insurance coverage and copay estimation.  Dyspnea on exertion Previously associated with high humidity and heat, resolved with environmental changes.      Dispo: Ryan Crane can follow-up in 6 months with MD  Signed, Orren LOISE Fabry, PA-C   "

## 2024-12-25 ENCOUNTER — Encounter: Payer: Self-pay | Admitting: Physician Assistant

## 2024-12-25 ENCOUNTER — Ambulatory Visit: Attending: Physician Assistant | Admitting: Physician Assistant

## 2024-12-25 VITALS — BP 116/80 | HR 59 | Ht 68.0 in | Wt 169.0 lb

## 2024-12-25 DIAGNOSIS — Z79899 Other long term (current) drug therapy: Secondary | ICD-10-CM | POA: Diagnosis not present

## 2024-12-25 DIAGNOSIS — R0609 Other forms of dyspnea: Secondary | ICD-10-CM

## 2024-12-25 DIAGNOSIS — E782 Mixed hyperlipidemia: Secondary | ICD-10-CM

## 2024-12-25 MED ORDER — ATORVASTATIN CALCIUM 20 MG PO TABS: 20.0000 mg | ORAL_TABLET | Freq: Every day | ORAL | 1 refills | Status: AC

## 2024-12-25 NOTE — Patient Instructions (Addendum)
 Medication Instructions:  Lipitor dosage decreased to 20mg  Take 1 tablet once day  4 cholesterol medications to consider Praulent Repatha Leqvio Nexlizet *If you need a refill on your cardiac medications before your next appointment, please call your pharmacy*  Lab Work: TODAY-LIPIDS & LFTS If you have labs (blood work) drawn today and your tests are completely normal, you will receive your results only by: MyChart Message (if you have MyChart) OR A paper copy in the mail If you have any lab test that is abnormal or we need to change your treatment, we will call you to review the results.  Testing/Procedures: NONE ORDERED  Follow-Up: At [facility], you and your health needs are our priority.  As part of our continuing mission to provide you with exceptional heart care, our providers are all part of one team.  This team includes your primary Cardiologist (physician) and Advanced Practice Providers or APPs (Physician Assistants and Nurse Practitioners) who all work together to provide you with the care you need, when you need it.  Your next appointment:   6 month(s)  Provider:   Georganna Archer, MD or Emeline Calender, MD  We recommend signing up for the patient portal called MyChart.  Sign up information is provided on this After Visit Summary.  MyChart is used to connect with patients for Virtual Visits (Telemedicine).  Patients are able to view lab/test results, encounter notes, upcoming appointments, etc.  Non-urgent messages can be sent to your provider as well.   To learn more about what you can do with MyChart, go to [email or web address].   Other Instructions

## 2024-12-26 LAB — LIPID PANEL
Chol/HDL Ratio: 3.6 ratio (ref 0.0–5.0)
Cholesterol, Total: 135 mg/dL (ref 100–199)
HDL: 37 mg/dL — ABNORMAL LOW
LDL Chol Calc (NIH): 63 mg/dL (ref 0–99)
Triglycerides: 211 mg/dL — ABNORMAL HIGH (ref 0–149)
VLDL Cholesterol Cal: 35 mg/dL (ref 5–40)

## 2024-12-26 LAB — HEPATIC FUNCTION PANEL
ALT: 28 IU/L (ref 0–44)
AST: 35 IU/L (ref 0–40)
Albumin: 4.2 g/dL (ref 3.9–4.9)
Alkaline Phosphatase: 114 IU/L (ref 47–123)
Bilirubin Total: 0.5 mg/dL (ref 0.0–1.2)
Bilirubin, Direct: 0.28 mg/dL (ref 0.00–0.40)
Total Protein: 6.5 g/dL (ref 6.0–8.5)

## 2024-12-28 ENCOUNTER — Ambulatory Visit: Payer: Self-pay | Admitting: Physician Assistant

## 2024-12-29 ENCOUNTER — Ambulatory Visit

## 2024-12-29 DIAGNOSIS — E538 Deficiency of other specified B group vitamins: Secondary | ICD-10-CM

## 2024-12-29 MED ORDER — CYANOCOBALAMIN 1000 MCG/ML IJ SOLN
1000.0000 ug | Freq: Once | INTRAMUSCULAR | Status: AC
  Administered 2024-12-29: 1000 ug via INTRAMUSCULAR

## 2024-12-29 NOTE — Progress Notes (Signed)
 Per orders of Dr. Arlyss Solian, injection of B-12 given by Nellie Hummer in Left deltoid Patient tolerated injection well. Patient will make appointment for 1 month(s)

## 2024-12-30 NOTE — Telephone Encounter (Signed)
 Spoke with pt regarding lab results. Pt verbalized understanding an had no further questions at this time.

## 2025-02-02 ENCOUNTER — Ambulatory Visit

## 2025-02-15 ENCOUNTER — Ambulatory Visit: Admitting: "Endocrinology

## 2025-03-09 ENCOUNTER — Ambulatory Visit

## 2025-04-13 ENCOUNTER — Ambulatory Visit

## 2025-04-20 ENCOUNTER — Ambulatory Visit

## 2025-04-27 ENCOUNTER — Other Ambulatory Visit

## 2025-05-04 ENCOUNTER — Encounter: Admitting: Family Medicine
